# Patient Record
Sex: Female | Born: 1960 | Race: White | Hispanic: No | Smoking: Current every day smoker
Health system: Southern US, Community
[De-identification: ages and names within clinical notes are randomized; demographics above are authoritative.]

## PROBLEM LIST (undated history)

## (undated) DIAGNOSIS — M7710 Lateral epicondylitis, unspecified elbow: Secondary | ICD-10-CM

## (undated) DIAGNOSIS — Z9889 Other specified postprocedural states: Secondary | ICD-10-CM

## (undated) DIAGNOSIS — F329 Major depressive disorder, single episode, unspecified: Secondary | ICD-10-CM

## (undated) DIAGNOSIS — K3189 Other diseases of stomach and duodenum: Secondary | ICD-10-CM

## (undated) DIAGNOSIS — R1032 Left lower quadrant pain: Secondary | ICD-10-CM

## (undated) DIAGNOSIS — R030 Elevated blood-pressure reading, without diagnosis of hypertension: Secondary | ICD-10-CM

## (undated) DIAGNOSIS — M1990 Unspecified osteoarthritis, unspecified site: Secondary | ICD-10-CM

## (undated) DIAGNOSIS — H60399 Other infective otitis externa, unspecified ear: Secondary | ICD-10-CM

## (undated) DIAGNOSIS — D509 Iron deficiency anemia, unspecified: Secondary | ICD-10-CM

## (undated) DIAGNOSIS — F411 Generalized anxiety disorder: Secondary | ICD-10-CM

## (undated) DIAGNOSIS — M25529 Pain in unspecified elbow: Secondary | ICD-10-CM

## (undated) DIAGNOSIS — M549 Dorsalgia, unspecified: Secondary | ICD-10-CM

## (undated) DIAGNOSIS — I639 Cerebral infarction, unspecified: Secondary | ICD-10-CM

## (undated) DIAGNOSIS — K140 Glossitis: Secondary | ICD-10-CM

## (undated) DIAGNOSIS — R109 Unspecified abdominal pain: Secondary | ICD-10-CM

## (undated) DIAGNOSIS — R350 Frequency of micturition: Secondary | ICD-10-CM

## (undated) DIAGNOSIS — D518 Other vitamin B12 deficiency anemias: Secondary | ICD-10-CM

## (undated) DIAGNOSIS — R509 Fever, unspecified: Secondary | ICD-10-CM

## (undated) DIAGNOSIS — G4700 Insomnia, unspecified: Secondary | ICD-10-CM

## (undated) DIAGNOSIS — M545 Low back pain: Secondary | ICD-10-CM

## (undated) DIAGNOSIS — S0990XA Unspecified injury of head, initial encounter: Secondary | ICD-10-CM

## (undated) DIAGNOSIS — G43909 Migraine, unspecified, not intractable, without status migrainosus: Secondary | ICD-10-CM

## (undated) DIAGNOSIS — M5030 Other cervical disc degeneration, unspecified cervical region: Secondary | ICD-10-CM

## (undated) DIAGNOSIS — H9209 Otalgia, unspecified ear: Secondary | ICD-10-CM

## (undated) DIAGNOSIS — K219 Gastro-esophageal reflux disease without esophagitis: Secondary | ICD-10-CM

## (undated) DIAGNOSIS — R1031 Right lower quadrant pain: Secondary | ICD-10-CM

## (undated) DIAGNOSIS — R1013 Epigastric pain: Secondary | ICD-10-CM

## (undated) DIAGNOSIS — E538 Deficiency of other specified B group vitamins: Secondary | ICD-10-CM

## (undated) DIAGNOSIS — T424X1A Poisoning by benzodiazepines, accidental (unintentional), initial encounter: Secondary | ICD-10-CM

## (undated) DIAGNOSIS — I2510 Atherosclerotic heart disease of native coronary artery without angina pectoris: Secondary | ICD-10-CM

## (undated) DIAGNOSIS — R079 Chest pain, unspecified: Secondary | ICD-10-CM

## (undated) DIAGNOSIS — E785 Hyperlipidemia, unspecified: Secondary | ICD-10-CM

## (undated) DIAGNOSIS — M479 Spondylosis, unspecified: Secondary | ICD-10-CM

## (undated) HISTORY — DX: Other cervical disc degeneration, unspecified cervical region: M50.30

## (undated) HISTORY — DX: Chest pain, unspecified: R07.9

## (undated) HISTORY — PX: CEREBRAL ANEURYSM REPAIR: SHX164

## (undated) HISTORY — DX: Other diseases of stomach and duodenum: K31.89

## (undated) HISTORY — DX: Unspecified abdominal pain: R10.9

## (undated) HISTORY — DX: Low back pain: M54.5

## (undated) HISTORY — DX: Left lower quadrant pain: R10.32

## (undated) HISTORY — DX: Glossitis: K14.0

## (undated) HISTORY — DX: Insomnia, unspecified: G47.00

## (undated) HISTORY — DX: Deficiency of other specified B group vitamins: E53.8

## (undated) HISTORY — PX: ANKLE FRACTURE SURGERY: SHX122

## (undated) HISTORY — DX: Elevated blood-pressure reading, without diagnosis of hypertension: R03.0

## (undated) HISTORY — PX: BACK SURGERY: SHX140

## (undated) HISTORY — DX: Spondylosis, unspecified: M47.9

## (undated) HISTORY — DX: Hyperlipidemia, unspecified: E78.5

## (undated) HISTORY — DX: Major depressive disorder, single episode, unspecified: F32.9

## (undated) HISTORY — DX: Dorsalgia, unspecified: M54.9

## (undated) HISTORY — DX: Unspecified osteoarthritis, unspecified site: M19.90

## (undated) HISTORY — PX: TUBAL LIGATION: SHX77

## (undated) HISTORY — DX: Migraine, unspecified, not intractable, without status migrainosus: G43.909

## (undated) HISTORY — DX: Other vitamin B12 deficiency anemias: D51.8

## (undated) HISTORY — DX: Other infective otitis externa, unspecified ear: H60.399

## (undated) HISTORY — DX: Right lower quadrant pain: R10.31

## (undated) HISTORY — DX: Otalgia, unspecified ear: H92.09

## (undated) HISTORY — DX: Lateral epicondylitis, unspecified elbow: M77.10

## (undated) HISTORY — DX: Fever, unspecified: R50.9

## (undated) HISTORY — DX: Other specified postprocedural states: Z98.890

## (undated) HISTORY — DX: Poisoning by benzodiazepines, accidental (unintentional), initial encounter: T42.4X1A

## (undated) HISTORY — DX: Gastro-esophageal reflux disease without esophagitis: K21.9

## (undated) HISTORY — DX: Epigastric pain: R10.13

## (undated) HISTORY — DX: Pain in unspecified elbow: M25.529

## (undated) HISTORY — DX: Iron deficiency anemia, unspecified: D50.9

## (undated) HISTORY — DX: Frequency of micturition: R35.0

## (undated) HISTORY — DX: Unspecified injury of head, initial encounter: S09.90XA

## (undated) HISTORY — DX: Generalized anxiety disorder: F41.1

---

## 1997-08-26 ENCOUNTER — Emergency Department (HOSPITAL_COMMUNITY): Admission: EM | Admit: 1997-08-26 | Discharge: 1997-08-26 | Payer: Self-pay | Admitting: Emergency Medicine

## 1998-06-23 ENCOUNTER — Other Ambulatory Visit: Admission: RE | Admit: 1998-06-23 | Discharge: 1998-06-23 | Payer: Self-pay | Admitting: Internal Medicine

## 1998-08-02 ENCOUNTER — Ambulatory Visit (HOSPITAL_BASED_OUTPATIENT_CLINIC_OR_DEPARTMENT_OTHER): Admission: RE | Admit: 1998-08-02 | Discharge: 1998-08-02 | Payer: Self-pay | Admitting: Orthopaedic Surgery

## 1999-09-22 ENCOUNTER — Emergency Department (HOSPITAL_COMMUNITY): Admission: EM | Admit: 1999-09-22 | Discharge: 1999-09-22 | Payer: Self-pay | Admitting: Emergency Medicine

## 1999-09-23 ENCOUNTER — Encounter: Payer: Self-pay | Admitting: Emergency Medicine

## 1999-11-28 ENCOUNTER — Emergency Department (HOSPITAL_COMMUNITY): Admission: EM | Admit: 1999-11-28 | Discharge: 1999-11-28 | Payer: Self-pay | Admitting: Emergency Medicine

## 2000-07-14 ENCOUNTER — Encounter: Payer: Self-pay | Admitting: Emergency Medicine

## 2000-07-14 ENCOUNTER — Emergency Department (HOSPITAL_COMMUNITY): Admission: EM | Admit: 2000-07-14 | Discharge: 2000-07-14 | Payer: Self-pay | Admitting: Emergency Medicine

## 2001-04-27 HISTORY — PX: APPENDECTOMY: SHX54

## 2001-05-02 ENCOUNTER — Encounter (INDEPENDENT_AMBULATORY_CARE_PROVIDER_SITE_OTHER): Payer: Self-pay | Admitting: Specialist

## 2001-05-03 ENCOUNTER — Inpatient Hospital Stay (HOSPITAL_COMMUNITY): Admission: EM | Admit: 2001-05-03 | Discharge: 2001-05-04 | Payer: Self-pay

## 2001-05-03 ENCOUNTER — Encounter: Payer: Self-pay | Admitting: *Deleted

## 2001-05-03 ENCOUNTER — Encounter: Payer: Self-pay | Admitting: Emergency Medicine

## 2002-02-12 ENCOUNTER — Encounter: Payer: Self-pay | Admitting: Emergency Medicine

## 2002-02-12 ENCOUNTER — Inpatient Hospital Stay (HOSPITAL_COMMUNITY): Admission: EM | Admit: 2002-02-12 | Discharge: 2002-02-13 | Payer: Self-pay | Admitting: Emergency Medicine

## 2002-02-17 ENCOUNTER — Ambulatory Visit (HOSPITAL_BASED_OUTPATIENT_CLINIC_OR_DEPARTMENT_OTHER): Admission: RE | Admit: 2002-02-17 | Discharge: 2002-02-17 | Payer: Self-pay | Admitting: Urology

## 2002-02-17 HISTORY — PX: CYSTOSCOPY W/ URETEROSCOPY: SUR379

## 2002-11-19 ENCOUNTER — Ambulatory Visit (HOSPITAL_BASED_OUTPATIENT_CLINIC_OR_DEPARTMENT_OTHER): Admission: RE | Admit: 2002-11-19 | Discharge: 2002-11-19 | Payer: Self-pay | Admitting: Urology

## 2002-11-19 ENCOUNTER — Encounter: Payer: Self-pay | Admitting: Urology

## 2003-04-02 ENCOUNTER — Other Ambulatory Visit: Admission: RE | Admit: 2003-04-02 | Discharge: 2003-04-02 | Payer: Self-pay | Admitting: Obstetrics & Gynecology

## 2003-05-29 ENCOUNTER — Encounter: Payer: Self-pay | Admitting: Internal Medicine

## 2003-05-29 HISTORY — PX: ABDOMINAL HYSTERECTOMY: SHX81

## 2004-01-25 ENCOUNTER — Encounter (INDEPENDENT_AMBULATORY_CARE_PROVIDER_SITE_OTHER): Payer: Self-pay | Admitting: Specialist

## 2004-01-25 ENCOUNTER — Observation Stay (HOSPITAL_COMMUNITY): Admission: RE | Admit: 2004-01-25 | Discharge: 2004-01-26 | Payer: Self-pay | Admitting: Obstetrics & Gynecology

## 2004-04-07 ENCOUNTER — Ambulatory Visit: Payer: Self-pay | Admitting: Internal Medicine

## 2004-06-07 ENCOUNTER — Ambulatory Visit: Payer: Self-pay | Admitting: Internal Medicine

## 2004-06-07 ENCOUNTER — Ambulatory Visit (HOSPITAL_COMMUNITY): Admission: RE | Admit: 2004-06-07 | Discharge: 2004-06-07 | Payer: Self-pay | Admitting: Internal Medicine

## 2005-02-05 ENCOUNTER — Ambulatory Visit: Payer: Self-pay | Admitting: Internal Medicine

## 2005-04-30 ENCOUNTER — Ambulatory Visit: Payer: Self-pay | Admitting: Internal Medicine

## 2005-05-24 ENCOUNTER — Ambulatory Visit: Payer: Self-pay | Admitting: Internal Medicine

## 2005-06-15 ENCOUNTER — Ambulatory Visit: Payer: Self-pay | Admitting: Internal Medicine

## 2005-07-20 ENCOUNTER — Ambulatory Visit: Payer: Self-pay | Admitting: Internal Medicine

## 2005-09-05 ENCOUNTER — Ambulatory Visit: Payer: Self-pay | Admitting: Internal Medicine

## 2006-01-24 ENCOUNTER — Ambulatory Visit: Payer: Self-pay | Admitting: Internal Medicine

## 2006-02-19 ENCOUNTER — Ambulatory Visit: Payer: Self-pay | Admitting: Internal Medicine

## 2006-05-07 ENCOUNTER — Ambulatory Visit: Payer: Self-pay | Admitting: Internal Medicine

## 2006-05-28 HISTORY — PX: ANTERIOR CERVICAL DECOMP/DISCECTOMY FUSION: SHX1161

## 2006-08-06 ENCOUNTER — Ambulatory Visit: Payer: Self-pay | Admitting: Internal Medicine

## 2006-08-06 LAB — CONVERTED CEMR LAB
ALT: 23 units/L (ref 0–40)
Albumin: 3.4 g/dL — ABNORMAL LOW (ref 3.5–5.2)
BUN: 5 mg/dL — ABNORMAL LOW (ref 6–23)
Basophils Absolute: 0.1 10*3/uL (ref 0.0–0.1)
Basophils Relative: 0.6 % (ref 0.0–1.0)
Bilirubin Urine: NEGATIVE
CO2: 33 meq/L — ABNORMAL HIGH (ref 19–32)
Eosinophils Absolute: 0.1 10*3/uL (ref 0.0–0.6)
Eosinophils Relative: 1 % (ref 0.0–5.0)
Glucose, Bld: 83 mg/dL (ref 70–99)
HCT: 36.2 % (ref 36.0–46.0)
HDL: 56.7 mg/dL (ref >39.0)
Hemoglobin, Urine: NEGATIVE
Leukocytes, UA: NEGATIVE
Monocytes Absolute: 0.7 10*3/uL (ref 0.2–0.7)
Monocytes Relative: 7.7 % (ref 3.0–11.0)
Neutro Abs: 5 10*3/uL (ref 1.4–7.7)
Neutrophils Relative %: 55.2 % (ref 43.0–77.0)
RBC: 3.72 M/uL — ABNORMAL LOW (ref 3.87–5.11)
RDW: 13.6 % (ref 11.5–14.6)
TSH: 1.21 microintl units/mL (ref 0.35–5.50)
Total Bilirubin: 0.5 mg/dL (ref 0.3–1.2)
Total CHOL/HDL Ratio: 3.6
Urine Glucose: NEGATIVE mg/dL

## 2006-08-17 ENCOUNTER — Ambulatory Visit (HOSPITAL_COMMUNITY): Admission: RE | Admit: 2006-08-17 | Discharge: 2006-08-17 | Payer: Self-pay | Admitting: Internal Medicine

## 2006-08-20 ENCOUNTER — Ambulatory Visit: Payer: Self-pay | Admitting: Internal Medicine

## 2006-10-22 ENCOUNTER — Ambulatory Visit (HOSPITAL_COMMUNITY): Admission: RE | Admit: 2006-10-22 | Discharge: 2006-10-23 | Payer: Self-pay | Admitting: Neurosurgery

## 2006-11-01 ENCOUNTER — Ambulatory Visit: Payer: Self-pay | Admitting: Internal Medicine

## 2006-11-01 LAB — CONVERTED CEMR LAB
AST: 17 units/L (ref 0–37)
Albumin: 2.9 g/dL — ABNORMAL LOW (ref 3.5–5.2)
Alkaline Phosphatase: 76 units/L (ref 39–117)
BUN: 1 mg/dL — ABNORMAL LOW (ref 6–23)
Basophils Relative: 0.9 % (ref 0.0–1.0)
CO2: 30 meq/L (ref 19–32)
Creatinine, Ser: 0.4 mg/dL (ref 0.4–1.2)
GFR calc non Af Amer: 182 mL/min
Glucose, Bld: 81 mg/dL (ref 70–99)
HCT: 32.7 % — ABNORMAL LOW (ref 36.0–46.0)
Lymphocytes Relative: 35.1 % (ref 12.0–46.0)
MCV: 94.2 fL (ref 78.0–100.0)
Monocytes Absolute: 0.5 10*3/uL (ref 0.2–0.7)
Monocytes Relative: 7.2 % (ref 3.0–11.0)
Neutrophils Relative %: 55.1 % (ref 43.0–77.0)
Platelets: 423 10*3/uL — ABNORMAL HIGH (ref 150–400)
Potassium: 3.8 meq/L (ref 3.5–5.1)
RBC: 3.47 M/uL — ABNORMAL LOW (ref 3.87–5.11)
RDW: 12.2 % (ref 11.5–14.6)
Total Bilirubin: 0.3 mg/dL (ref 0.3–1.2)

## 2006-11-25 ENCOUNTER — Emergency Department (HOSPITAL_COMMUNITY): Admission: EM | Admit: 2006-11-25 | Discharge: 2006-11-25 | Payer: Self-pay | Admitting: Emergency Medicine

## 2006-11-25 ENCOUNTER — Ambulatory Visit: Payer: Self-pay | Admitting: *Deleted

## 2006-11-25 ENCOUNTER — Inpatient Hospital Stay (HOSPITAL_COMMUNITY): Admission: RE | Admit: 2006-11-25 | Discharge: 2006-11-29 | Payer: Self-pay | Admitting: *Deleted

## 2006-12-12 ENCOUNTER — Encounter: Payer: Self-pay | Admitting: Internal Medicine

## 2007-02-04 ENCOUNTER — Ambulatory Visit: Payer: Self-pay | Admitting: Internal Medicine

## 2007-02-06 ENCOUNTER — Encounter: Payer: Self-pay | Admitting: Internal Medicine

## 2007-02-06 DIAGNOSIS — F329 Major depressive disorder, single episode, unspecified: Secondary | ICD-10-CM

## 2007-02-06 DIAGNOSIS — F411 Generalized anxiety disorder: Secondary | ICD-10-CM

## 2007-02-06 DIAGNOSIS — F3289 Other specified depressive episodes: Secondary | ICD-10-CM

## 2007-02-06 DIAGNOSIS — G43909 Migraine, unspecified, not intractable, without status migrainosus: Secondary | ICD-10-CM

## 2007-02-06 DIAGNOSIS — G4700 Insomnia, unspecified: Secondary | ICD-10-CM | POA: Insufficient documentation

## 2007-02-06 DIAGNOSIS — F32A Depression, unspecified: Secondary | ICD-10-CM | POA: Insufficient documentation

## 2007-02-06 DIAGNOSIS — M1990 Unspecified osteoarthritis, unspecified site: Secondary | ICD-10-CM | POA: Insufficient documentation

## 2007-02-06 DIAGNOSIS — M545 Low back pain, unspecified: Secondary | ICD-10-CM

## 2007-02-06 DIAGNOSIS — E785 Hyperlipidemia, unspecified: Secondary | ICD-10-CM

## 2007-02-06 HISTORY — DX: Hyperlipidemia, unspecified: E78.5

## 2007-02-06 HISTORY — DX: Generalized anxiety disorder: F41.1

## 2007-02-06 HISTORY — DX: Insomnia, unspecified: G47.00

## 2007-02-06 HISTORY — DX: Major depressive disorder, single episode, unspecified: F32.9

## 2007-02-06 HISTORY — DX: Migraine, unspecified, not intractable, without status migrainosus: G43.909

## 2007-02-06 HISTORY — DX: Low back pain, unspecified: M54.50

## 2007-02-06 HISTORY — DX: Other specified depressive episodes: F32.89

## 2007-03-12 ENCOUNTER — Encounter: Payer: Self-pay | Admitting: Internal Medicine

## 2007-03-12 ENCOUNTER — Ambulatory Visit: Payer: Self-pay | Admitting: Internal Medicine

## 2007-03-12 DIAGNOSIS — M479 Spondylosis, unspecified: Secondary | ICD-10-CM | POA: Insufficient documentation

## 2007-03-12 DIAGNOSIS — R350 Frequency of micturition: Secondary | ICD-10-CM

## 2007-07-01 ENCOUNTER — Ambulatory Visit: Payer: Self-pay | Admitting: Internal Medicine

## 2007-07-01 DIAGNOSIS — R030 Elevated blood-pressure reading, without diagnosis of hypertension: Secondary | ICD-10-CM | POA: Insufficient documentation

## 2007-07-01 DIAGNOSIS — M549 Dorsalgia, unspecified: Secondary | ICD-10-CM

## 2007-07-01 HISTORY — DX: Elevated blood-pressure reading, without diagnosis of hypertension: R03.0

## 2007-07-01 HISTORY — DX: Dorsalgia, unspecified: M54.9

## 2007-07-30 ENCOUNTER — Encounter: Payer: Self-pay | Admitting: Internal Medicine

## 2007-07-31 ENCOUNTER — Ambulatory Visit: Payer: Self-pay | Admitting: Internal Medicine

## 2007-10-23 ENCOUNTER — Ambulatory Visit: Payer: Self-pay | Admitting: Internal Medicine

## 2007-10-23 DIAGNOSIS — K140 Glossitis: Secondary | ICD-10-CM | POA: Insufficient documentation

## 2007-10-23 DIAGNOSIS — K3189 Other diseases of stomach and duodenum: Secondary | ICD-10-CM

## 2007-10-23 DIAGNOSIS — R1013 Epigastric pain: Secondary | ICD-10-CM

## 2007-11-10 ENCOUNTER — Telehealth: Payer: Self-pay | Admitting: Internal Medicine

## 2007-12-09 ENCOUNTER — Telehealth: Payer: Self-pay | Admitting: Internal Medicine

## 2007-12-29 ENCOUNTER — Telehealth: Payer: Self-pay | Admitting: Internal Medicine

## 2007-12-29 ENCOUNTER — Emergency Department (HOSPITAL_COMMUNITY): Admission: EM | Admit: 2007-12-29 | Discharge: 2007-12-30 | Payer: Self-pay | Admitting: Emergency Medicine

## 2007-12-30 ENCOUNTER — Telehealth (INDEPENDENT_AMBULATORY_CARE_PROVIDER_SITE_OTHER): Payer: Self-pay | Admitting: *Deleted

## 2008-01-09 ENCOUNTER — Telehealth (INDEPENDENT_AMBULATORY_CARE_PROVIDER_SITE_OTHER): Payer: Self-pay | Admitting: *Deleted

## 2008-02-09 ENCOUNTER — Telehealth: Payer: Self-pay | Admitting: Internal Medicine

## 2008-03-09 ENCOUNTER — Telehealth (INDEPENDENT_AMBULATORY_CARE_PROVIDER_SITE_OTHER): Payer: Self-pay | Admitting: *Deleted

## 2008-03-30 ENCOUNTER — Ambulatory Visit: Payer: Self-pay | Admitting: Internal Medicine

## 2008-04-12 ENCOUNTER — Telehealth (INDEPENDENT_AMBULATORY_CARE_PROVIDER_SITE_OTHER): Payer: Self-pay | Admitting: *Deleted

## 2008-05-10 ENCOUNTER — Telehealth (INDEPENDENT_AMBULATORY_CARE_PROVIDER_SITE_OTHER): Payer: Self-pay | Admitting: *Deleted

## 2008-06-03 ENCOUNTER — Ambulatory Visit: Payer: Self-pay | Admitting: Internal Medicine

## 2008-06-03 DIAGNOSIS — R509 Fever, unspecified: Secondary | ICD-10-CM | POA: Insufficient documentation

## 2008-06-03 DIAGNOSIS — M7710 Lateral epicondylitis, unspecified elbow: Secondary | ICD-10-CM | POA: Insufficient documentation

## 2008-06-08 ENCOUNTER — Encounter: Payer: Self-pay | Admitting: Internal Medicine

## 2008-06-11 ENCOUNTER — Telehealth (INDEPENDENT_AMBULATORY_CARE_PROVIDER_SITE_OTHER): Payer: Self-pay | Admitting: *Deleted

## 2008-06-24 ENCOUNTER — Telehealth: Payer: Self-pay | Admitting: Internal Medicine

## 2008-07-03 ENCOUNTER — Emergency Department (HOSPITAL_COMMUNITY): Admission: EM | Admit: 2008-07-03 | Discharge: 2008-07-03 | Payer: Self-pay | Admitting: Emergency Medicine

## 2008-07-12 ENCOUNTER — Telehealth (INDEPENDENT_AMBULATORY_CARE_PROVIDER_SITE_OTHER): Payer: Self-pay | Admitting: *Deleted

## 2008-08-06 ENCOUNTER — Telehealth: Payer: Self-pay | Admitting: Internal Medicine

## 2008-09-07 ENCOUNTER — Telehealth (INDEPENDENT_AMBULATORY_CARE_PROVIDER_SITE_OTHER): Payer: Self-pay | Admitting: *Deleted

## 2008-09-10 ENCOUNTER — Telehealth (INDEPENDENT_AMBULATORY_CARE_PROVIDER_SITE_OTHER): Payer: Self-pay | Admitting: *Deleted

## 2008-10-08 ENCOUNTER — Telehealth (INDEPENDENT_AMBULATORY_CARE_PROVIDER_SITE_OTHER): Payer: Self-pay | Admitting: *Deleted

## 2008-11-02 ENCOUNTER — Ambulatory Visit: Payer: Self-pay | Admitting: Internal Medicine

## 2008-11-08 ENCOUNTER — Telehealth: Payer: Self-pay | Admitting: Internal Medicine

## 2008-11-09 ENCOUNTER — Encounter: Admission: RE | Admit: 2008-11-09 | Discharge: 2008-11-09 | Payer: Self-pay | Admitting: Internal Medicine

## 2008-12-08 ENCOUNTER — Telehealth: Payer: Self-pay | Admitting: Internal Medicine

## 2009-01-07 ENCOUNTER — Telehealth: Payer: Self-pay | Admitting: Internal Medicine

## 2009-01-18 ENCOUNTER — Telehealth: Payer: Self-pay | Admitting: Internal Medicine

## 2009-02-09 ENCOUNTER — Telehealth: Payer: Self-pay | Admitting: Internal Medicine

## 2009-02-24 ENCOUNTER — Telehealth: Payer: Self-pay | Admitting: Internal Medicine

## 2009-03-11 ENCOUNTER — Telehealth: Payer: Self-pay | Admitting: Internal Medicine

## 2009-04-11 ENCOUNTER — Telehealth: Payer: Self-pay | Admitting: Internal Medicine

## 2009-05-03 ENCOUNTER — Ambulatory Visit: Payer: Self-pay | Admitting: Internal Medicine

## 2009-05-03 DIAGNOSIS — M5030 Other cervical disc degeneration, unspecified cervical region: Secondary | ICD-10-CM | POA: Insufficient documentation

## 2009-05-03 HISTORY — DX: Other cervical disc degeneration, unspecified cervical region: M50.30

## 2009-05-31 ENCOUNTER — Ambulatory Visit: Payer: Self-pay | Admitting: Internal Medicine

## 2009-06-14 ENCOUNTER — Ambulatory Visit: Payer: Self-pay | Admitting: Internal Medicine

## 2009-06-15 LAB — CONVERTED CEMR LAB
ALT: 15 units/L (ref 0–35)
AST: 24 units/L (ref 0–37)
Albumin: 3.4 g/dL — ABNORMAL LOW (ref 3.5–5.2)
BUN: 12 mg/dL (ref 6–23)
Basophils Relative: 1.1 % (ref 0.0–3.0)
CO2: 28 meq/L (ref 19–32)
Calcium: 8.8 mg/dL (ref 8.4–10.5)
Chloride: 109 meq/L (ref 96–112)
Eosinophils Relative: 1 % (ref 0.0–5.0)
Folate: 12.7 ng/mL
GFR calc non Af Amer: 112.59 mL/min (ref >60)
HCT: 29.6 % — ABNORMAL LOW (ref 36.0–46.0)
Ketones, ur: NEGATIVE mg/dL
LDL Cholesterol: 99 mg/dL (ref 0–99)
Lymphocytes Relative: 34.7 % (ref 12.0–46.0)
Monocytes Absolute: 0.6 10*3/uL (ref 0.1–1.0)
Monocytes Relative: 6.7 % (ref 3.0–12.0)
Neutro Abs: 4.6 10*3/uL (ref 1.4–7.7)
Neutrophils Relative %: 56.5 % (ref 43.0–77.0)
Potassium: 4.2 meq/L (ref 3.5–5.1)
RDW: 15.3 % — ABNORMAL HIGH (ref 11.5–14.6)
Saturation Ratios: 2.3 % — ABNORMAL LOW (ref 20.0–50.0)
Sodium: 143 meq/L (ref 135–145)
Specific Gravity, Urine: >=1.03 (ref 1.000–1.030)
TSH: 3.6 microintl units/mL (ref 0.35–5.50)
Transferrin: 306.6 mg/dL (ref 212.0–360.0)
Urine Glucose: NEGATIVE mg/dL
VLDL: 30.6 mg/dL (ref 0.0–40.0)
Vitamin B-12: 209 pg/mL — ABNORMAL LOW (ref 211–911)
pH: 5 (ref 5.0–8.0)

## 2009-06-20 ENCOUNTER — Encounter: Payer: Self-pay | Admitting: Internal Medicine

## 2009-06-21 ENCOUNTER — Encounter (INDEPENDENT_AMBULATORY_CARE_PROVIDER_SITE_OTHER): Payer: Self-pay | Admitting: *Deleted

## 2009-06-21 ENCOUNTER — Ambulatory Visit: Payer: Self-pay | Admitting: Internal Medicine

## 2009-06-21 DIAGNOSIS — D509 Iron deficiency anemia, unspecified: Secondary | ICD-10-CM

## 2009-06-21 DIAGNOSIS — R079 Chest pain, unspecified: Secondary | ICD-10-CM

## 2009-06-21 HISTORY — DX: Iron deficiency anemia, unspecified: D50.9

## 2009-06-22 ENCOUNTER — Telehealth: Payer: Self-pay | Admitting: Internal Medicine

## 2009-07-11 ENCOUNTER — Ambulatory Visit: Payer: Self-pay | Admitting: Internal Medicine

## 2009-07-11 DIAGNOSIS — D518 Other vitamin B12 deficiency anemias: Secondary | ICD-10-CM

## 2009-07-11 DIAGNOSIS — K219 Gastro-esophageal reflux disease without esophagitis: Secondary | ICD-10-CM

## 2009-07-11 HISTORY — DX: Gastro-esophageal reflux disease without esophagitis: K21.9

## 2009-07-11 HISTORY — DX: Other vitamin B12 deficiency anemias: D51.8

## 2009-07-19 ENCOUNTER — Telehealth: Payer: Self-pay | Admitting: Endocrinology

## 2009-07-26 ENCOUNTER — Ambulatory Visit: Payer: Self-pay | Admitting: Internal Medicine

## 2009-07-26 DIAGNOSIS — R1032 Left lower quadrant pain: Secondary | ICD-10-CM

## 2009-07-26 DIAGNOSIS — R1031 Right lower quadrant pain: Secondary | ICD-10-CM | POA: Insufficient documentation

## 2009-07-26 DIAGNOSIS — E538 Deficiency of other specified B group vitamins: Secondary | ICD-10-CM

## 2009-07-26 LAB — CONVERTED CEMR LAB
AST: 25 units/L (ref 0–37)
Albumin: 3.9 g/dL (ref 3.5–5.2)
Alkaline Phosphatase: 79 units/L (ref 39–117)
Bilirubin Urine: NEGATIVE
Bilirubin, Direct: 0.1 mg/dL (ref 0.0–0.3)
Creatinine, Ser: 0.4 mg/dL (ref 0.4–1.2)
Eosinophils Relative: 0.7 % (ref 0.0–5.0)
GFR calc non Af Amer: 179.68 mL/min (ref >60)
Glucose, Bld: 78 mg/dL (ref 70–99)
Hemoglobin, Urine: NEGATIVE
Ketones, ur: NEGATIVE mg/dL
Lipase: 23 units/L (ref 11.0–59.0)
Lymphocytes Relative: 29.1 % (ref 12.0–46.0)
Lymphs Abs: 2.7 10*3/uL (ref 0.7–4.0)
MCHC: 32.5 g/dL (ref 30.0–36.0)
MCV: 89.3 fL (ref 78.0–100.0)
Monocytes Absolute: 0.6 10*3/uL (ref 0.1–1.0)
Sodium: 146 meq/L — ABNORMAL HIGH (ref 135–145)
Total Protein, Urine: NEGATIVE mg/dL
Total Protein: 7.3 g/dL (ref 6.0–8.3)
Urine Glucose: NEGATIVE mg/dL
WBC: 9.2 10*3/uL (ref 4.5–10.5)

## 2009-08-01 ENCOUNTER — Telehealth: Payer: Self-pay | Admitting: Internal Medicine

## 2009-08-04 ENCOUNTER — Ambulatory Visit: Payer: Self-pay | Admitting: Internal Medicine

## 2009-08-11 ENCOUNTER — Encounter: Payer: Self-pay | Admitting: Internal Medicine

## 2009-08-23 ENCOUNTER — Encounter: Admission: RE | Admit: 2009-08-23 | Discharge: 2009-08-23 | Payer: Self-pay | Admitting: Internal Medicine

## 2009-08-30 ENCOUNTER — Ambulatory Visit: Payer: Self-pay | Admitting: Internal Medicine

## 2009-08-31 ENCOUNTER — Encounter: Admission: RE | Admit: 2009-08-31 | Discharge: 2009-08-31 | Payer: Self-pay | Admitting: Internal Medicine

## 2009-08-31 LAB — HM MAMMOGRAPHY

## 2009-09-22 ENCOUNTER — Encounter: Payer: Self-pay | Admitting: Internal Medicine

## 2009-10-04 ENCOUNTER — Ambulatory Visit: Payer: Self-pay | Admitting: Internal Medicine

## 2009-10-04 DIAGNOSIS — H60399 Other infective otitis externa, unspecified ear: Secondary | ICD-10-CM | POA: Insufficient documentation

## 2009-11-03 ENCOUNTER — Telehealth: Payer: Self-pay | Admitting: Internal Medicine

## 2009-11-29 ENCOUNTER — Encounter (INDEPENDENT_AMBULATORY_CARE_PROVIDER_SITE_OTHER): Payer: Self-pay | Admitting: *Deleted

## 2009-11-29 ENCOUNTER — Ambulatory Visit: Payer: Self-pay | Admitting: Internal Medicine

## 2009-11-29 DIAGNOSIS — S0990XA Unspecified injury of head, initial encounter: Secondary | ICD-10-CM | POA: Insufficient documentation

## 2009-11-29 DIAGNOSIS — H9209 Otalgia, unspecified ear: Secondary | ICD-10-CM | POA: Insufficient documentation

## 2009-11-29 DIAGNOSIS — R109 Unspecified abdominal pain: Secondary | ICD-10-CM

## 2009-11-29 HISTORY — DX: Unspecified injury of head, initial encounter: S09.90XA

## 2009-11-29 LAB — CONVERTED CEMR LAB
AST: 25 units/L (ref 0–37)
BUN: 20 mg/dL (ref 6–23)
Basophils Absolute: 0 10*3/uL (ref 0.0–0.1)
CO2: 29 meq/L (ref 19–32)
Chloride: 104 meq/L (ref 96–112)
Creatinine, Ser: 0.6 mg/dL (ref 0.4–1.2)
Eosinophils Relative: 0.7 % (ref 0.0–5.0)
GFR calc non Af Amer: 114.58 mL/min (ref >60)
Hemoglobin, Urine: NEGATIVE
Lymphocytes Relative: 20.1 % (ref 12.0–46.0)
Lymphs Abs: 3.7 10*3/uL (ref 0.7–4.0)
MCV: 98.1 fL (ref 78.0–100.0)
Monocytes Absolute: 1.3 10*3/uL — ABNORMAL HIGH (ref 0.1–1.0)
Neutro Abs: 13.2 10*3/uL — ABNORMAL HIGH (ref 1.4–7.7)
Neutrophils Relative %: 71.8 % (ref 43.0–77.0)
Nitrite: NEGATIVE
Platelets: 448 10*3/uL — ABNORMAL HIGH (ref 150.0–400.0)
Potassium: 3.8 meq/L (ref 3.5–5.1)
RDW: 13.6 % (ref 11.5–14.6)
Urobilinogen, UA: 0.2 (ref 0.0–1.0)
pH: 5.5 (ref 5.0–8.0)

## 2009-11-30 ENCOUNTER — Ambulatory Visit: Payer: Self-pay | Admitting: Internal Medicine

## 2009-12-19 ENCOUNTER — Telehealth: Payer: Self-pay | Admitting: Internal Medicine

## 2009-12-29 ENCOUNTER — Telehealth: Payer: Self-pay | Admitting: Internal Medicine

## 2010-01-27 ENCOUNTER — Telehealth: Payer: Self-pay | Admitting: Internal Medicine

## 2010-02-27 ENCOUNTER — Telehealth: Payer: Self-pay | Admitting: Internal Medicine

## 2010-03-27 ENCOUNTER — Telehealth: Payer: Self-pay | Admitting: Internal Medicine

## 2010-05-01 ENCOUNTER — Telehealth: Payer: Self-pay | Admitting: Internal Medicine

## 2010-05-31 ENCOUNTER — Telehealth: Payer: Self-pay | Admitting: Internal Medicine

## 2010-06-18 ENCOUNTER — Encounter: Payer: Self-pay | Admitting: Internal Medicine

## 2010-06-20 ENCOUNTER — Ambulatory Visit: Admit: 2010-06-20 | Payer: Self-pay | Admitting: Internal Medicine

## 2010-06-27 ENCOUNTER — Ambulatory Visit
Admission: RE | Admit: 2010-06-27 | Discharge: 2010-06-27 | Payer: Self-pay | Source: Home / Self Care | Attending: Internal Medicine | Admitting: Internal Medicine

## 2010-06-27 ENCOUNTER — Other Ambulatory Visit: Payer: Self-pay | Admitting: Internal Medicine

## 2010-06-27 ENCOUNTER — Encounter: Payer: Self-pay | Admitting: Internal Medicine

## 2010-06-27 DIAGNOSIS — M25529 Pain in unspecified elbow: Secondary | ICD-10-CM | POA: Insufficient documentation

## 2010-06-27 LAB — CBC WITH DIFFERENTIAL/PLATELET
Eosinophils Absolute: 0.1 10*3/uL (ref 0.0–0.7)
Eosinophils Relative: 0.7 % (ref 0.0–5.0)
HCT: 38.6 % (ref 36.0–46.0)
Hemoglobin: 13.3 g/dL (ref 12.0–15.0)
MCHC: 34.3 g/dL (ref 30.0–36.0)
MCV: 100.2 fl — ABNORMAL HIGH (ref 78.0–100.0)
Monocytes Absolute: 0.5 10*3/uL (ref 0.1–1.0)
Monocytes Relative: 5.7 % (ref 3.0–12.0)
Platelets: 415 10*3/uL — ABNORMAL HIGH (ref 150.0–400.0)
WBC: 8.9 10*3/uL (ref 4.5–10.5)

## 2010-06-27 LAB — BASIC METABOLIC PANEL
BUN: 15 mg/dL (ref 6–23)
GFR: 184.32 mL/min (ref >60.00)
Glucose, Bld: 70 mg/dL (ref 70–99)

## 2010-06-27 LAB — HEPATIC FUNCTION PANEL
ALT: 15 U/L (ref 0–35)
Albumin: 3.9 g/dL (ref 3.5–5.2)
Alkaline Phosphatase: 85 U/L (ref 39–117)
Total Protein: 7 g/dL (ref 6.0–8.3)

## 2010-06-27 LAB — B12 AND FOLATE PANEL: Folate: 8.1 ng/mL (ref >5.9)

## 2010-06-27 LAB — TSH: TSH: 1.25 u[IU]/mL (ref 0.35–5.50)

## 2010-06-27 LAB — LIPID PANEL
Cholesterol: 273 mg/dL — ABNORMAL HIGH (ref 0–200)
Triglycerides: 95 mg/dL (ref 0.0–149.0)
VLDL: 19 mg/dL (ref 0.0–40.0)

## 2010-06-27 LAB — URINALYSIS
Ketones, ur: NEGATIVE
Leukocytes, UA: NEGATIVE
Urine Glucose: NEGATIVE

## 2010-06-27 LAB — LDL CHOLESTEROL, DIRECT: Direct LDL: 189.4 mg/dL

## 2010-06-27 NOTE — Progress Notes (Signed)
Summary: rx refill request  Phone Note Call from Patient Call back at Home Phone [phone]   Caller: Patient Summary of Call: patient called requesting authorization for early refill of pain med. pharmacy would not fill prescription because it was too early. Initial call taken by: Sydell Axon,  August 01, 2009 8:26 AM     Appended Document: rx refill request patient called back and said to disregard the previous message. she contacted the pharmacy, and they will refill the prescription.

## 2010-06-27 NOTE — Progress Notes (Signed)
Summary: Percocet  Phone Note Call from Patient   Caller: Patient [phone] Summary of Call: Pt called requesting refill of Percocet Initial call taken by:  Pyle, CMA,  May 01, 2010 9:10 AM  Follow-up for Phone Call        Pt informed via VM, Rx in cabinet for pt pick up Follow-up by:  Pyle, CMA,  May 01, 2010 10:52 AM    New/Updated Medications: PERCOCET 10-325 MG  TABS (OXYCODONE-ACETAMINOPHEN) 1/2 - 1 by mouth qid as needed pain - to fill May 01, 2010 Prescriptions: PERCOCET 10-325 MG  TABS (OXYCODONE-ACETAMINOPHEN) 1/2 - 1 by mouth qid as needed pain - to fill May 01, 2010  #120 x 0   Entered and Authorized by:   Corwin Levins MD   Signed by:   Corwin Levins MD on 05/01/2010   Method used:   Print then Give to Patient   RxID:   [id number]  done hardcopy to LIM side B - dahlia Corwin Levins MD  May 01, 2010 10:41 AM

## 2010-06-27 NOTE — Procedures (Signed)
Summary: Upper Endoscopy  Patient: Michelle [name] Note: All result statuses are Final unless otherwise noted.  Tests: (1) Upper Endoscopy (EGD)   EGD Upper Endoscopy       DONE     [facility]     [address]     [place], [place]  [address]           ENDOSCOPY PROCEDURE REPORT           PATIENT:  Michelle [name], Michelle [name]  MR#:  [id number]     BIRTHDATE:  02-08-1961, 48 yrs. old  GENDER:  female           ENDOSCOPIST:  Wilhemina Bonito. Eda Keys, MD     Referred by:  Oliver Barre, M.D.           PROCEDURE DATE:  08/04/2009     PROCEDURE:  EGD with biopsy     ASA CLASS:  Class II     INDICATIONS:  anemia ( low iron sat and B12) ; GERD           MEDICATIONS:   There was residual sedation effect present from     prior procedure., Fentanyl 25 mcg IV, Versed 2 mg IV     TOPICAL ANESTHETIC:  Exactacain Spray           DESCRIPTION OF PROCEDURE:   After the risks benefits and     alternatives of the procedure were thoroughly explained, informed     consent was obtained.  The [facility] [id number] endoscope was     introduced through the mouth and advanced to the third portion of     the duodenum, without limitations.  The instrument was slowly     withdrawn as the mucosa was fully examined.     <<PROCEDUREIMAGES>>           The upper, middle, and distal third of the esophagus were     carefully inspected and no abnormalities were noted. The z-line     was well seen at the GEJ. The endoscope was pushed into the fundus     which was normal including a retroflexed view. The antrum,gastric     body, first and second part of the duodenum were unremarkable     except for atrophic gastric mucosa.    Retroflexed views revealed     no abnormalities.  Biopsies of postbulbar duodenum taken to r/o     sprue.  The scope was then withdrawn from the patient and the     procedure completed.           COMPLICATIONS:  None           ENDOSCOPIC IMPRESSION:     1) Atrophic gastric mucosa     2) Otherwise  Normal EGD     3) Gerd           RECOMMENDATIONS:     1) continue iron and B12 replacement     2) Follow up with Dr. Jonny Ruiz in a few weeks to have your blood     counts checked; if still low, may need to see a hematologist (will     leave up to Dr Jonny Ruiz)           ______________________________     Wilhemina Bonito. Eda Keys, MD           CC:  Corwin Levins, MD, The Patient           n.  eSIGNED:   Wilhemina Bonito. Eda Keys at 08/04/2009 02:28 PM           Dara Lords, [id number]  Note: An exclamation mark (!) indicates a result that was not dispersed into the flowsheet. Document Creation Date: 08/04/2009 2:29 PM _______________________________________________________________________  (1) Order result status: Final Collection or observation date-time: 08/04/2009 14:20 Requested date-time:  Receipt date-time:  Reported date-time:  Referring Physician:   Ordering Physician: Fransico Setters [phone]) Specimen Source:  Source: Launa Grill Order Number: [id number] Lab site:
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
## 2010-06-27 NOTE — Letter (Signed)
Summary: [facility] Instructions  [facility]  [address] [place], [place] [address]   Phone: [phone]  Fax: [phone]       Michelle [name]    [birth year]    MRN: [id number]        Procedure Day /Date:THURSDAY, 08/04/09     Arrival Time:12:30 PM     Procedure Time:1:30 PM     Location of Procedure:                    X  [facility] (4th Floor)   PREPARATION FOR COLONOSCOPY WITH MOVIPREP/ENDO   Starting 5 days prior to your procedure 07/30/09 do not eat nuts, seeds, popcorn, corn, beans, peas,  salads, or any raw vegetables.  Do not take any fiber supplements (e.g. Metamucil, Citrucel, and Benefiber).  THE DAY BEFORE YOUR PROCEDURE         DATE: 08/03/09  ONG:[id number]  1.  Drink clear liquids the entire day-NO SOLID FOOD  2.  Do not drink anything colored red or purple.  Avoid juices with pulp.  No orange juice.  3.  Drink at least 64 oz. (8 glasses) of fluid/clear liquids during the day to prevent dehydration and help the prep work efficiently.  CLEAR LIQUIDS INCLUDE: Water Jello Ice Popsicles Tea (sugar ok, no milk/cream) Powdered fruit flavored drinks Coffee (sugar ok, no milk/cream) Gatorade Juice: apple, white grape, white cranberry  Lemonade Clear bullion, consomm, broth Carbonated beverages (any kind) Strained chicken noodle soup Hard Candy                             4.  In the morning, mix first dose of MoviPrep solution:    Empty 1 Pouch A and 1 Pouch B into the disposable container    Add lukewarm drinking water to the top line of the container. Mix to dissolve    Refrigerate (mixed solution should be used within 24 hrs)  5.  Begin drinking the prep at 5:00 p.m. The MoviPrep container is divided by 4 marks.   Every 15 minutes drink the solution down to the next mark (approximately 8 oz) until the full liter is complete.   6.  Follow completed prep with 16 oz of clear liquid of your choice (Nothing red or purple).   Continue to drink clear liquids until bedtime.  7.  Before going to bed, mix second dose of MoviPrep solution:    Empty 1 Pouch A and 1 Pouch B into the disposable container    Add lukewarm drinking water to the top line of the container. Mix to dissolve    Refrigerate  THE DAY OF YOUR PROCEDURE      DATE: 08/04/09 DAY: THURSDAY  Beginning at 8:30 a.m. (5 hours before procedure):         1. Every 15 minutes, drink the solution down to the next mark (approx 8 oz) until the full liter is complete.  2. Follow completed prep with 16 oz. of clear liquid of your choice.    3. You may drink clear liquids until 11:30 AM (2 HOURS BEFORE PROCEDURE).   MEDICATION INSTRUCTIONS  Unless otherwise instructed, you should take regular prescription medications with a small sip of water   as early as possible the morning of your procedure.           OTHER INSTRUCTIONS  You will need a responsible adult at least 51 years of  age to accompany you and drive you home.   This person must remain in the waiting room during your procedure.  Wear loose fitting clothing that is easily removed.  Leave jewelry and other valuables at home.  However, you may wish to bring a book to read or  an iPod/MP3 player to listen to music as you wait for your procedure to start.  Remove all body piercing jewelry and leave at home.  Total time from sign-in until discharge is approximately 2-3 hours.  You should go home directly after your procedure and rest.  You can resume normal activities the  day after your procedure.  The day of your procedure you should not:   Drive   Make legal decisions   Operate machinery   Drink alcohol   Return to work  You will receive specific instructions about eating, activities and medications before you leave.    The above instructions have been reviewed and explained to me by   _______________________    I fully understand and can verbalize these instructions  _____________________________ Date _________

## 2010-06-27 NOTE — Assessment & Plan Note (Signed)
Summary: Michelle [name] DEF/YF   History of Present Illness Visit Type: Initial Consult Primary GI MD: Yancey Flemings MD Primary Provider: Oliver Barre, MD Chief Complaint: Iron def anemia x 45month, with fatigue History of Present Illness:   50 year old white female with a history of anxiety/depression, hyperlipidemia, osteoarthritis, chronic migraine headaches, and nephrolithiasis. She is sent today regarding evaluation of new onset iron deficiency anemia. The patient had been feeling fatigued. Review of outside laboratory records shows anemia with a hemoglobin of 9.0. Normal white blood cell count. Mild elevation of platelets 442,000. Normal MCV of 85. Comprehensive metabolic panel was unremarkable except for a glucose of 67 and albumin 3.4. Urinalysis negative. Iron studies revealed iron deficiency with iron saturation of 2.3%. B12 levels also low at 209 (211-911). Folate was normal. She was started on oral iron as well as B12. She anticipates followup blood counts with Dr. Jonny Ruiz in about one month. She denies a prior history of GI problems. She does have intermittent problems with heartburn and indigestion for which she is on omeprazole. She complains of chronic intermittent nausea, bloating, and burning in the lower abdomen. No change in bowel habits. No melena or hematochezia. Denies weight loss. She is status post remote hysterectomy. She does not donate blood. She is accompanied today by her sister.   GI Review of Systems    Reports abdominal pain, bloating, nausea, and  vomiting.     Location of  Abdominal pain: lower abdomen.    Denies acid reflux, belching, chest pain, dysphagia with liquids, dysphagia with solids, heartburn, loss of appetite, vomiting blood, weight loss, and  weight gain.        Denies anal fissure, black tarry stools, change in bowel habit, constipation, diarrhea, diverticulosis, fecal incontinence, heme positive stool, hemorrhoids, irritable bowel syndrome, jaundice, light color  stool, liver problems, rectal bleeding, and  rectal pain. Preventive Screening-Counseling & Management      Drug Use:  no.      Current Medications (verified): 1)  Gabapentin 400 Mg Caps (Gabapentin) .Marland Kitchen.. 1 By Mouth Three Times A Day 2)  Alprazolam 0.5 Mg  Tabs (Alprazolam) .Marland Kitchen.. 1 By Mouth Two Times A Day As Needed - To Fill Mar 06, 2009 3)  Percocet 10-325 Mg  Tabs (Oxycodone-Acetaminophen) .Marland Kitchen.. 1 By Mouth Qid As Needed Pain - To Fill Jul 09, 2009 4)  Robaxin-750 750 Mg Tabs (Methocarbamol) .Marland Kitchen.. 1 By Mouth Two Times A Day As Needed 5)  Omeprazole 20 Mg  Cpdr (Omeprazole) .... 2 By Mouth Once Daily 6)  Trimethobenzamide Hcl 300 Mg Caps (Trimethobenzamide Hcl) .Marland Kitchen.. 1 By Mouth Four Times Per Day As Needed 7)  Lexapro 10 Mg Tabs (Escitalopram Oxalate) .Marland Kitchen.. 1po Once Daily  Allergies (verified): 1)  ! Voltaren 2)  ! Ibuprofen 3)  ! Effexor 4)  ! 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         entire review of systems otherwise reported to be negative  Vital Signs:  Patient profile:   50 year old female Height:      62 inches Weight:      107 pounds BMI:     19.64 Pulse rate:   60 /  minute Pulse rhythm:   regular BP sitting:   132 / 86  (left arm) Cuff size:   regular  Vitals Entered By: June McMurray CMA Duncan Dull) (July 11, 2009 9:14 AM)  Physical Exam  General:  Well developed, thinwell nourished, no acute distress. Head:  Normocephalic and atraumatic. Ears:  Normal auditory acuity. Nose:  No deformity, discharge,  or lesions. Mouth:  No deformity or lesions Neck:  Supple; no masses or thyromegaly. Lungs:  Clear throughout to auscultation. Heart:  Regular rate and rhythm; no murmurs, rubs,  or bruits. Abdomen:  Soft, nontender and nondistended. No masses, hepatosplenomegaly or hernias noted. Normal bowel sounds. Rectal:  deferred until colonoscopy Msk:  Symmetrical with no gross deformities. Normal posture. Pulses:  Normal pulses noted. Extremities:  No clubbing, cyanosis, edema or deformities noted. Neurologic:  Alert and  oriented x4;  grossly normal neurologically. Skin:  Intact without significant lesions or rashes. Cervical Nodes:  No significant cervical adenopathy.no supraclavicular adenopathy Psych:  Alert and cooperative. Normal mood and affect.   Impression & Recommendations:  Problem # 1:  ANEMIA-IRON DEFICIENCY (ICD-280.9) Iron deficiency anemia of uncertain etiology. Rule out occult GI mucosal lesions.  Plan: #1. Colonoscopy. The nature of the procedure as well as the risks, benefits, and alternatives have been reviewed. She understood and agreed to proceed #2. Movi prep prescribed. The patient instructed on its use #3. Upper endoscopy with possible duodenal biopsies. The nature of the procedure as well as the risks, benefits, and alternatives have been reviewed. She understood and agreed to proceed #4. Continue iron supplements #5. Keep followup plans with Dr. Jonny Ruiz  Problem # 2:  ANEMIA-B12 DEFICIENCY (ICD-281.1) etiology unclear. Agree with B12 replacement and follow up with Dr. Jonny Ruiz as planned  Problem # 3:  GERD  (ICD-530.81) chronic GERD. On omeprazole  Plan: #1. Reflux precautions #2. Stop smoking #3. Continue omeprazole #4. Upper endoscopy planned  Other Orders: Colon/Endo (Colon/Endo)  Patient Instructions: 1)  Colon/Endo [facility] 08/04/09 1:30 pm 2)  Movi prep instructions given to patient. 3)  Movi prep Rx. sent to patient's pharmacy to pick up. 4)  Colonoscopy and Flexible Sigmoidoscopy brochure given.  5)  Upper Endoscopy brochure given.  6)  The medication list was reviewed and reconciled.  All changed / newly prescribed medications were explained.  A complete medication list was provided to the patient / caregiver. 7)  Copy: Dr. Oliver Barre Prescriptions: MOVIPREP 100 GM  SOLR (PEG-KCL-NACL-NASULF-NA ASC-C) As per prep instructions.  #1 x 0   Entered by:   Milford Cage NCMA   Authorized by:   Hilarie Fredrickson MD   Signed by:   Milford Cage NCMA on 07/11/2009   Method used:   Electronically to        [facility]  [phone]* (retail)       [address]       [place], [place]  [address]       Ph: [phone] or [phone]       Fax: [phone]   RxID:   [id number]

## 2010-06-27 NOTE — Assessment & Plan Note (Signed)
Summary: BACK AND HIP PAIN  STC   Vital Signs:  Patient profile:   50 year old female Height:      62 inches Weight:      105.38 pounds BMI:     19.34 O2 Sat:      98 % on Room air Temp:     98.3 degrees F oral Pulse rate:   109 / minute BP sitting:   150 / 98  (left arm) Cuff size:   regular  Vitals Entered ByZella Ball Ewing (July 26, 2009 11:29 AM)  O2 Flow:  Room air CC: back and hip pain, labs/RE   Primary Care Provider:  Oliver Barre, MD  CC:  back and hip pain and labs/RE.  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; pain flare worse across the lower back, severe 8/10, worse left more than right as well as to get up from chair, bending or lifitng, not assoc with increased LE pain , weakness or numb; or fever, wt loss , night sweats bowel change (normal BM/s per pt) but has had some increase urinary freq for 2 wks, as well some dysuria worse 2 wks ago that has now improved;  also with lower abd pain and tenderness that she was thinking was more MSK similar to the lower back flare;  has been using more percocet recently up to 6 per day due to pain and has not overused in the past;  no fever, chills; had some mild nausea but no vomiting, and no overt bleeding, bruising or other blood loss.   Had recent iron def anemia, saw Dr Marina Goodell who plans GI w/u, not yet finished.    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) 13)  Fever Unspecified  (ICD-780.60) 14)  Elevated Blood  Pressure Without Diagnosis of Hypertension  (ICD-796.2) 15)  Lateral Epicondylitis, Left  (ICD-726.32) 16)  Dyspepsia  (ICD-536.8) 17)  Glossitis  (ICD-529.0) 18)  Elevated Blood Pressure Without Diagnosis of Hypertension  (ICD-796.2) 19)  Back Pain  (ICD-724.5) 20)  Frequency, Urinary  (ICD-788.41) 21)  Family History of Cad Female 1st Degree Relative <50  (ICD-V17.3) 22)  Osteoarthritis, Cervical Spine  (ICD-721.90) 23)  Insomnia  (ICD-780.52) 24)  Osteoarthritis  (ICD-715.90) 25)  Low Back Pain  (ICD-724.2) 26)  Hyperlipidemia  (ICD-272.4) 27)  Depression  (ICD-311) 28)  Anxiety  (ICD-300.00) 29)  Migraine Headache  (ICD-346.90)  Medications Prior to Update: 1)  Gabapentin 400 Mg Caps (Gabapentin) .Marland Kitchen.. 1 By Mouth Three Times A Day 2)  Alprazolam 0.5 Mg  Tabs (Alprazolam) .Marland Kitchen.. 1 By Mouth Two Times A Day As Needed - To Fill Mar 06, 2009 3)  Percocet 10-325 Mg  Tabs (Oxycodone-Acetaminophen) .Marland Kitchen.. 1 By Mouth Qid As Needed Pain - To Fill Jul 09, 2009 4)  Robaxin-750 750 Mg Tabs (Methocarbamol) .Marland Kitchen.. 1 By Mouth Two Times A Day As Needed 5)  Omeprazole 20 Mg  Cpdr (Omeprazole) .... 2 By Mouth Once Daily 6)  Trimethobenzamide Hcl 300  Mg Caps (Trimethobenzamide Hcl) .Marland Kitchen.. 1 By Mouth Four Times Per Day As Needed 7)  Lexapro 10 Mg Tabs (Escitalopram Oxalate) .Marland Kitchen.. 1po Once Daily 8)  Moviprep 100 Gm  Solr (Peg-Kcl-Nacl-Nasulf-Na Asc-C) .... As Per Prep Instructions.  Current Medications (verified): 1)  Gabapentin 400 Mg Caps (Gabapentin) .Marland Kitchen.. 1 By Mouth Three Times A Day 2)  Alprazolam 0.5 Mg  Tabs (Alprazolam) .Marland Kitchen.. 1 By Mouth Two Times A Day As Needed - To Fill Mar 06, 2009 3)  Percocet 10-325 Mg  Tabs (Oxycodone-Acetaminophen) .Marland Kitchen.. 1 By Mouth Qid As Needed Pain - To Fill Jul 26, 2009 4)  Robaxin-750 750 Mg Tabs (Methocarbamol) .Marland Kitchen.. 1 By Mouth Two Times A Day As Needed 5)  Omeprazole 20 Mg  Cpdr (Omeprazole) .... 2 By Mouth Once Daily 6)  Trimethobenzamide Hcl 300 Mg Caps (Trimethobenzamide Hcl)  .Marland Kitchen.. 1 By Mouth Four Times Per Day As Needed 7)  Effexor Xr 150 Mg Xr24h-Cap (Venlafaxine Hcl) .Marland Kitchen.. 1po Once Daily 8)  Moviprep 100 Gm  Solr (Peg-Kcl-Nacl-Nasulf-Na Asc-C) .... As Per Prep Instructions. 9)  Ciprofloxacin Hcl 500 Mg Tabs (Ciprofloxacin Hcl) .Marland Kitchen.. 1 By Mouth Two Times A Day  Allergies (verified): 1)  ! Voltaren 2)  ! Ibuprofen 3)  ! Effexor 4)  ! 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 Illicit Drug Use - no  Risk Factors: Smoking Status: quit (03/12/2007)  Past Medical History: Migraine Headaches Anxiety Depression Hyperlipidemia Low back pain Osteoarthritis Insomnia cervical spine djd Anemia-iron deficiency Kidney Stones B12 deficiency   Review of Systems  The patient denies anorexia, fever, vision loss, decreased hearing, hoarseness, chest pain, syncope, dyspnea on exertion, peripheral edema, prolonged cough, headaches, hemoptysis, melena, hematochezia, severe indigestion/heartburn, hematuria, muscle weakness, suspicious skin lesions, transient blindness, difficulty walking, unusual weight change, abnormal bleeding, enlarged lymph nodes, and angioedema.         all otherwise negative per pt -   Physical Exam  General:  alert and underweight appearing. ,mild ill appearing, wincing in pain to ascend the exam table. Head:  normocephalic and atraumatic.   Eyes:  vision grossly intact, pupils equal, and pupils round.   Ears:  R ear normal and L ear normal.   Nose:  no external deformity and no nasal discharge.   Mouth:  no gingival abnormalities and pharynx pink and moist.   Neck:  supple and no masses.     Lungs:  normal respiratory effort and normal breath sounds.   Heart:  normal rate and regular rhythm.   Abdomen:  soft, normal bowel sounds, no masses, no guarding, and no rigidity.  but mild epiastric tender noted, as well as moderate tender to lower mid abd and left lower quad area Msk:  no spine tender throughout, but marked bilat paravertebral tender (no swelling or red or rash) left more than right Extremities:  no edema, no erythema  Neurologic:  exam somewhat limited due to lower back pain, but appears to have intact LE motor, sensory and dtr's Psych:  depressed affect and slightly anxious.     Impression & Recommendations:  Problem # 1:  ABDOMINAL PAIN, LEFT LOWER QUADRANT (ICD-789.04)  and low mid abd with urinary freq recent,  but tenderness out  of proportion I think to simple UTI/cystitis - cant r/o diverticulitis, stone  or other though she is afebrile; also cant r/o ovary issue;  will check urgent labs and urine cx, start empiric cipro course  Orders: TLB-BMP (Basic Metabolic Panel-BMET) (80048-METABOL) TLB-CBC Platelet - w/Differential (85025-CBCD) TLB-Hepatic/Liver Function Pnl (80076-HEPATIC) TLB-Lipase (83690-LIPASE) TLB-Udip w/ Micro (81001-URINE) T-Culture, Urine ([id number]) Radiology Referral (Radiology)  Problem # 2:  ANEMIA-IRON DEFICIENCY (ICD-280.9) to re-check as above , f/u colon /endo as planned, pending CT results  Problem # 3:  BACK PAIN (ICD-724.5)  Her updated medication list for this problem includes:    Percocet 10-325 Mg Tabs (Oxycodone-acetaminophen) .Marland Kitchen... 1 by mouth qid as needed pain - to fill Jul 26, 2009    Robaxin-750 750 Mg Tabs (Methocarbamol) .Marland Kitchen... 1 by mouth two times a day as needed acute on chronic lower back, worse but not enough to explain the abd findings;  ok to refill pain meds early this time and she has not abused in the past;  advised against overuse in the future; exam o/w as above  - consider MRI if persists, and/or ortho  eval  Problem # 4:  ANEMIA-B12 DEFICIENCY (ICD-281.1) also to start b12 shots IM monthly - likely inicidental recent finding  Problem # 5:  DEPRESSION (ICD-311)  Her updated medication list for this problem includes:    Alprazolam 0.5 Mg Tabs (Alprazolam) .Marland Kitchen... 1 by mouth two times a day as needed - to fill Mar 06, 2009    Effexor Xr 150 Mg Xr24h-cap (Venlafaxine hcl) .Marland Kitchen... 1po once daily pt requests to change back to effexor as lexpro not working as well  Complete Medication List: 1)  Gabapentin 400 Mg Caps (Gabapentin) .Marland Kitchen.. 1 by mouth three times a day 2)  Alprazolam 0.5 Mg Tabs (Alprazolam) .Marland Kitchen.. 1 by mouth two times a day as needed - to fill Mar 06, 2009 3)  Percocet 10-325 Mg Tabs (Oxycodone-acetaminophen) .Marland Kitchen.. 1 by mouth qid as needed pain - to fill Jul 26, 2009 4)  Robaxin-750 750 Mg Tabs (Methocarbamol) .Marland Kitchen.. 1 by mouth two times a day as needed 5)  Omeprazole 20 Mg Cpdr (Omeprazole) .... 2 by mouth once daily 6)  Trimethobenzamide Hcl 300 Mg Caps (Trimethobenzamide hcl) .Marland Kitchen.. 1 by mouth four times per day as needed 7)  Effexor Xr 150 Mg Xr24h-cap (Venlafaxine hcl) .Marland Kitchen.. 1po once daily 8)  Moviprep 100 Gm Solr (Peg-kcl-nacl-nasulf-na asc-c) .... As per prep instructions. 9)  Ciprofloxacin Hcl 500 Mg Tabs (Ciprofloxacin hcl) .Marland Kitchen.. 1 by mouth two times a day  Other Orders: Vit B12 1000 mcg (J3420) Admin of Therapeutic Inj  intramuscular or subcutaneous (62130)  Patient Instructions: 1)  you had the b12 shot today 2)  stop the lexapro 3)  start the effexor  4)  start the cipro antibiotic for possible bladder infection 5)  Please go to the Lab in the basement for your blood and/or urine tests today 6)  You will be contacted about the referral(s) to: CT scan 7)  You are given the pain med refill early this time 8)  Please schedule a follow-up appointment in 1 weeks. Prescriptions: CIPROFLOXACIN HCL 500 MG TABS (CIPROFLOXACIN HCL) 1 by mouth two times a day  #20 x 0   Entered and  Authorized by:   Corwin Levins MD   Signed by:   Corwin Levins MD on 07/26/2009   Method used:   Print then Give to Patient   RxID:   [id number] EFFEXOR XR 150  MG XR24H-CAP (VENLAFAXINE HCL) 1po once daily  #30 x 11   Entered and Authorized by:   Corwin Levins MD   Signed by:   Corwin Levins MD on 07/26/2009   Method used:   Print then Give to Patient   RxID:   [id number] PERCOCET 10-325 MG  TABS (OXYCODONE-ACETAMINOPHEN) 1 by mouth qid as needed pain - to fill Jul 26, 2009  #120 x 0   Entered and Authorized by:   Corwin Levins MD   Signed by:   Corwin Levins MD on 07/26/2009   Method used:   Print then Give to Patient   RxID:   [id number]    Medication Administration  Injection # 1:    Medication: Vit B12 1000 mcg    Diagnosis: VITAMIN B12 DEFICIENCY (ICD-266.2)    Route: IM    Site: L deltoid    Exp Date: 01/2011    Lot #: [id number]    Mfr: [employer]    Given by: Zella Ball Ewing (July 26, 2009 12:14 PM)  Orders Added: 1)  TLB-BMP (Basic Metabolic Panel-BMET) [80048-METABOL] 2)  TLB-CBC Platelet - w/Differential [85025-CBCD] 3)  TLB-Hepatic/Liver Function Pnl [80076-HEPATIC] 4)  TLB-Lipase [83690-LIPASE] 5)  TLB-Udip w/ Micro [81001-URINE] 6)  T-Culture, Urine [[id number]] 7)  Radiology Referral [Radiology] 8)  Vit B12 1000 mcg [J3420] 9)  Admin of Therapeutic Inj  intramuscular or subcutaneous [96372] 10)  Est. Patient Level V [44010]

## 2010-06-27 NOTE — Letter (Signed)
Summary: Out of Work  [facility]  [address] [place], [place] [address]   Phone: [phone]  Fax: [phone]    November 29, 2009   Employee:  Leo Grosser    To Whom It May Concern:   For Medical reasons, please excuse the above named employee from work for the following dates:  Start:   11/29/2009  End:   12/05/2009  Return to work December 05, 2009  If you need additional information, please feel free to contact our office.         Sincerely,    Dr. Oliver Barre

## 2010-06-27 NOTE — Progress Notes (Signed)
Summary: Michelle [name] pt  Phone Note Call from Patient Call back at Home Phone [phone]   Caller: Patient Summary of Call: pt called requesting refills of Xanax.  Initial call taken by: Margaret Pyle, CMA,  July 19, 2009 1:40 PM  Follow-up for Phone Call        i printed Follow-up by: Minus Breeding MD,  July 19, 2009 2:05 PM  Additional Follow-up for Phone Call Additional follow up Details #1::        pt informed Additional Follow-up by: Margaret Pyle, CMA,  July 19, 2009 2:06 PM    Prescriptions: ALPRAZOLAM 0.5 MG  TABS (ALPRAZOLAM) 1 by mouth two times a day as needed - to fill Mar 06, 2009  #60 x 3   Entered and Authorized by:   Minus Breeding MD   Signed by:   Minus Breeding MD on 07/19/2009   Method used:   Print then Give to Patient   RxID:   [id number]
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
## 2010-06-27 NOTE — Assessment & Plan Note (Signed)
Summary: physical---stc   Vital Signs:  Patient profile:   50 year old female Height:      62.5 inches Weight:      108 pounds BMI:     19.51 O2 Sat:      99 % on Room air Temp:     98 degrees F oral Pulse rate:   87 / minute BP sitting:   152 / 102  (left arm) Cuff size:   regular  Vitals Entered ByMarland Kitchen Zella Ball Ewing (June 21, 2009 10:52 AM)  O2 Flow:  Room air  CC: Adult physical/RE   CC:  Adult physical/RE.  History of Present Illness: citalopram makes her feel "wierd" ;  here with increased stress recently separated from husband and living with friend with incrased depressive symtpoms but no suicidal ideation or panic;  good complaince with meds, no illicit drug use, unable to quit smoking at this time.  Some wt loss overall and more tremulous with stress.  Due for tetanus today, needs form filled out for work documentation.  BP at home usually < 140/90.  Has some occasionally fleeting sharp and dull chest pains for several wks in different locations, no radiation, sob, doe, orhtopnea, pnd or worsening LE edema.    Problems Prior to Update: 1)  Chest Pain  (ICD-786.50) 2)  Anemia-iron Deficiency  (ICD-280.9) 3)  Preventive Health Care  (ICD-V70.0) 4)  Disc Disease, Cervical  (ICD-722.4) 5)  Fever Unspecified  (ICD-780.60) 6)  Elevated Blood Pressure Without Diagnosis of Hypertension  (ICD-796.2) 7)  Lateral Epicondylitis, Left  (ICD-726.32) 8)  Dyspepsia  (ICD-536.8) 9)  Glossitis  (ICD-529.0) 10)  Elevated Blood Pressure Without Diagnosis of Hypertension  (ICD-796.2) 11)  Back Pain  (ICD-724.5) 12)  Frequency, Urinary  (ICD-788.41) 13)  Family History of Cad Female 1st Degree Relative <50  (ICD-V17.3) 14)  Osteoarthritis, Cervical Spine  (ICD-721.90) 15)  Insomnia  (ICD-780.52) 16)  Osteoarthritis  (ICD-715.90) 17)  Low Back Pain  (ICD-724.2) 18)  Hyperlipidemia  (ICD-272.4) 19)  Depression  (ICD-311) 20)  Anxiety  (ICD-300.00) 21)  Migraine Headache   (ICD-346.90)  Medications Prior to Update: 1)  Gabapentin 400 Mg Caps (Gabapentin) .Marland Kitchen.. 1 By Mouth Three Times A Day 2)  Alprazolam 0.5 Mg  Tabs (Alprazolam) .Marland Kitchen.. 1 By Mouth Two Times A Day As Needed - To Fill Mar 06, 2009 3)  Percocet 10-325 Mg  Tabs (Oxycodone-Acetaminophen) .Marland Kitchen.. 1 By Mouth Qid As Needed Pain - To Fill Jul 09, 2009 4)  Robaxin-750 750 Mg Tabs (Methocarbamol) .Marland Kitchen.. 1 By Mouth Two Times A Day As Needed 5)  Omeprazole 20 Mg  Cpdr (Omeprazole) .... 2 By Mouth Once Daily 6)  Trimethobenzamide Hcl 300 Mg Caps (Trimethobenzamide Hcl) .Marland Kitchen.. 1 By Mouth Four Times Per Day As Needed 7)  Citalopram Hydrobromide 10 Mg Tabs (Citalopram Hydrobromide) .Marland Kitchen.. 1 By Mouth Once Daily  Current Medications (verified): 1)  Gabapentin 400 Mg Caps (Gabapentin) .Marland Kitchen.. 1 By Mouth Three Times A Day 2)  Alprazolam 0.5 Mg  Tabs (Alprazolam) .Marland Kitchen.. 1 By Mouth Two Times A Day As Needed - To Fill Mar 06, 2009 3)  Percocet 10-325 Mg  Tabs (Oxycodone-Acetaminophen) .Marland Kitchen.. 1 By Mouth Qid As Needed Pain - To Fill Jul 09, 2009 4)  Robaxin-750 750 Mg Tabs (Methocarbamol) .Marland Kitchen.. 1 By Mouth Two Times A Day As Needed 5)  Omeprazole 20 Mg  Cpdr (Omeprazole) .... 2 By Mouth Once Daily 6)  Trimethobenzamide Hcl 300 Mg Caps (Trimethobenzamide Hcl) .Marland Kitchen.. 1 By  Mouth Four Times Per Day As Needed 7)  Lexapro 10 Mg Tabs (Escitalopram Oxalate) .Marland Kitchen.. 1po Once Daily  Allergies (verified): 1)  ! Voltaren 2)  ! Ibuprofen 3)  ! Effexor 4)  ! 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   Risk Factors: Smoking Status: quit (03/12/2007)  Past Medical History: Migraine Headaches Anxiety Depression Hyperlipidemia Low back  pain Osteoarthritis Insomnia cervical spine djd Anemia-iron deficiency  Past Surgical History: R Ankle Surgery Hysterectomy - approx 2004 s/p c-spine fusion c5-6  Social History: Current Smoker 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         all otherwise negative per pt - except for ongoing fatigue and no OSA symptpoms  Physical Exam  General:  alert and underweight appearing.   Head:  normocephalic and atraumatic.   Eyes:  vision grossly intact, pupils equal, and pupils round.   Ears:  R ear normal and L ear normal.   Nose:  no external deformity and no nasal discharge.   Mouth:  no gingival abnormalities and pharynx pink and moist.   Neck:  supple and no masses.   Lungs:  normal respiratory effort and normal breath sounds.   Heart:  normal rate and regular rhythm.   Abdomen:  soft, non-tender, and normal bowel sounds.   Msk:  no joint tenderness and no joint swelling.   Extremities:  no edema, no erythema  Neurologic:  cranial nerves II-XII intact and strength normal in all extremities.   Skin:  color normal and no rashes.   Psych:  depressed affect and moderately anxious.     Impression & Recommendations:  Problem # 1:  Preventive Health Care (ICD-V70.0)  Overall doing well, age appropriate education and counseling updated and referral for appropriate preventive services done unless declined, immunizations up to date or declined, diet counseling done if  overweight, urged to quit smoking if smokes , most recent labs reviewed and current ordered if appropriate, ecg reviewed or declined (interpretation per ECG scanned in the EMR if done); information regarding Medicare Prevention requirements given if appropriate   Orders: EKG w/ Interpretation (93000)  Problem # 2:  DEPRESSION (ICD-311)  Her updated medication list for this problem includes:    Alprazolam 0.5 Mg Tabs (Alprazolam) .Marland Kitchen... 1 by mouth two times a day as needed - to fill Mar 06, 2009    Lexapro 10 Mg Tabs (Escitalopram oxalate) .Marland Kitchen... 1po once daily change to lexapro   Problem # 3:  ANEMIA-IRON DEFICIENCY (ICD-280.9)  for iron, no obvious bleeding, will refer GI  Orders: Gastroenterology Referral (GI)  Hgb: 9.0 (06/14/2009)   Hct: 29.6 (06/14/2009)   Platelets: 442.0 (06/14/2009) RBC: 3.48 (06/14/2009)   RDW: 15.3 (06/14/2009)   WBC: 8.3 (06/14/2009) MCV: 85.0 (06/14/2009)   MCHC: 30.3 L g/dL (16/02/9603) Iron: 10 (06/14/2009)   % Sat: 2.3 (06/14/2009) B12: 209 (06/14/2009)   Folate: 12.7 (  06/14/2009)   TSH: 3.60 (06/14/2009)  Problem # 4:  CHEST PAIN (ICD-786.50) vague and atypical, urged to quit smoking, to check cxr Orders: T-2 View CXR, Same Day (71020.5TC)  Problem # 5:  ELEVATED BLOOD PRESSURE WITHOUT DIAGNOSIS OF HYPERTENSION (ICD-796.2) f/u BP today 138/72 after rest; ok to follow  Complete Medication List: 1)  Gabapentin 400 Mg Caps (Gabapentin) .Marland Kitchen.. 1 by mouth three times a day 2)  Alprazolam 0.5 Mg Tabs (Alprazolam) .Marland Kitchen.. 1 by mouth two times a day as needed - to fill Mar 06, 2009 3)  Percocet 10-325 Mg Tabs (Oxycodone-acetaminophen) .Marland Kitchen.. 1 by mouth qid as needed pain - to fill Jul 09, 2009 4)  Robaxin-750 750 Mg Tabs (Methocarbamol) .Marland Kitchen.. 1 by mouth two times a day as needed 5)  Omeprazole 20 Mg Cpdr (Omeprazole) .... 2 by mouth once daily 6)  Trimethobenzamide Hcl 300 Mg Caps (Trimethobenzamide hcl) .Marland Kitchen.. 1 by mouth four times per day as needed 7)  Lexapro  10 Mg Tabs (Escitalopram oxalate) .Marland Kitchen.. 1po once daily  Other Orders: Tdap => 87yrs IM (16109) Admin 1st Vaccine (60454)  Patient Instructions: 1)  stop the citalopram 2)  star the lexapro at 10 mg per day 3)  please call for your yearly pap smear - considier [facility]  4)  please call for your yearly mammogram  - consider [facility] on [address] 5)  please take iron sulfate 325 mg - 1 per day - OTC 6)  please return in 4 wks for LAB only: 7)  CBC w/ Diff prior to visit, ICD-9: 285.9 8)  b12/folate:  285.9 9)  sed rate 285.9 10)  You will be contacted about the referral(s) to: GI appt 11)  Please go to Radiology in the basement level for your X-Ray today  12)  you had the tetanus shot today 13)  your form will be completed and faxed in 14)  Please schedule a follow-up appointment in 2 months with: 15)  CBC and iron/tibc:   285.9    -  or sooner if needed Prescriptions: LEXAPRO 10 MG TABS (ESCITALOPRAM OXALATE) 1po once daily  #30 x 11   Entered and Authorized by:   Corwin Levins MD   Signed by:   Corwin Levins MD on 06/21/2009   Method used:   Print then Give to Patient   RxID:   [id number]    Immunizations Administered:  Tetanus Vaccine:    Vaccine Type: Tdap    Site: right deltoid    Mfr: GlaxoSmithKline    Dose: 0.5 ml    Route: IM    Given by: Robin Ewing    Exp. Date: 07/23/2011    Lot #: [id number]    VIS given: 04/15/07 version given June 21, 2009.

## 2010-06-27 NOTE — Progress Notes (Signed)
Summary: Percocet  Phone Note Call from Patient Call back at [facility] Phone [phone]   Caller: Patient Summary of Call: Pt called requesting refill of pain meds. Pt is aware that request is early but she states she is leaving to go out of town Tuesday evening and would like to pick up Rx tomorrow, due Nov 4th. Initial call taken by: Margaret Pyle, CMA,  March 27, 2010 12:59 PM  Follow-up for Phone Call        ok  this time only - done hardcopy to LIM side B - dahlia  Follow-up by: Corwin Levins MD,  March 27, 2010 1:06 PM  Additional Follow-up for Phone Call Additional follow up Details #1::        pt informed. Rx in cabinet upfront for pickup Additional Follow-up by: Brenton Grills CMA Duncan Dull),  March 27, 2010 1:26 PM    New/Updated Medications: PERCOCET 10-325 MG  TABS (OXYCODONE-ACETAMINOPHEN) 1/2 - 1 by mouth qid as needed pain - to fill Mar 27, 2010 Prescriptions: PERCOCET 10-325 MG  TABS (OXYCODONE-ACETAMINOPHEN) 1/2 - 1 by mouth qid as needed pain - to fill Mar 27, 2010  #120 x 0   Entered and Authorized by:   Corwin Levins MD   Signed by:   Corwin Levins MD on 03/27/2010   Method used:   Print then Give to Patient   RxID:   [id number]

## 2010-06-27 NOTE — Progress Notes (Signed)
Summary: FYI  Phone Note Call from Patient Call back at [facility] Phone [phone]   Caller: Patient Summary of Call: pt called to inform MD that she will have OV notes from GYN and mammogram results sent to [facility] for MD's review and to help with paperwork left at last OV. I advised pt that once paperwork is filled out it will be faxed. Initial call taken by: Margaret Pyle, CMA,  June 22, 2009 4:32 PM

## 2010-06-27 NOTE — Progress Notes (Signed)
Summary: Ricci Barker PT  Phone Note Call from Patient Call back at Home Phone [phone]   Caller: Patient Call For: Corwin Levins MD Summary of Call: Pt left message on triage B requesting to pick up prescription for Percocet today. Please advise. Initial call taken by: Verdell Face,  January 27, 2010 8:15 AM  Follow-up for Phone Call        ok to ref for 9/4 Keep return office visit Dr Jonny Ruiz Follow-up by: Tresa Garter MD,  January 27, 2010 12:52 PM  Additional Follow-up for Phone Call Additional follow up Details #1::        left mess to call office back........Marland KitchenLamar Sprinkles, CMA  January 27, 2010 3:25 PM   Pt informed to pick up  Additional Follow-up by: Lamar Sprinkles, CMA,  January 27, 2010 3:48 PM    New/Updated Medications: PERCOCET 10-325 MG  TABS (OXYCODONE-ACETAMINOPHEN) 1/2 - 1 by mouth qid as needed pain - to fill Sept 4, 2011 Prescriptions: PERCOCET 10-325 MG  TABS (OXYCODONE-ACETAMINOPHEN) 1/2 - 1 by mouth qid as needed pain - to fill Sept 4, 2011  #120 x 0   Entered by:   Lamar Sprinkles, CMA   Authorized by:   Tresa Garter MD   Signed by:   Lamar Sprinkles, CMA on 01/27/2010   Method used:   Print then Give to Patient   RxID:   [id number]

## 2010-06-27 NOTE — Progress Notes (Signed)
Summary: REFILL - PERCOCET  Phone Note Refill Request Call back at [phone]   Refills Requested: Medication #1:  PERCOCET 10-325 MG  TABS 1/2 - 1 by mouth qid as needed pain - to fill Sept 4 Initial call taken by: Lamar Sprinkles, CMA,  February 27, 2010 9:00 AM  Follow-up for Phone Call        done hardcopy to LIM side B - dahlia  Follow-up by: Corwin Levins MD,  February 27, 2010 1:04 PM  Additional Follow-up for Phone Call Additional follow up Details #1::        Pt informed, Rx in cabinet for pt pick up Additional Follow-up by: Margaret Pyle, CMA,  February 27, 2010 1:11 PM    New/Updated Medications: PERCOCET 10-325 MG  TABS (OXYCODONE-ACETAMINOPHEN) 1/2 - 1 by mouth qid as needed pain - to fill Feb 28, 2010 Prescriptions: PERCOCET 10-325 MG  TABS (OXYCODONE-ACETAMINOPHEN) 1/2 - 1 by mouth qid as needed pain - to fill Feb 28, 2010  #120 x 0   Entered and Authorized by:   Corwin Levins MD   Signed by:   Corwin Levins MD on 02/27/2010   Method used:   Print then Give to Patient   RxID:   [id number]

## 2010-06-27 NOTE — Progress Notes (Signed)
  Phone Note Refill Request Message from:  Fax from Pharmacy on December 19, 2009 8:25 AM  Refills Requested: Medication #1:  TRIMETHOBENZAMIDE HCL 300 MG CAPS 1 by mouth four times per day as needed   Dosage confirmed as above?Dosage Confirmed   Last Refilled: 09/22/2009   Notes: [facility] Initial call taken by: Robin Ewing CMA Duncan Dull),  December 19, 2009 8:26 AM    Prescriptions: TRIMETHOBENZAMIDE HCL 300 MG CAPS (TRIMETHOBENZAMIDE HCL) 1 by mouth four times per day as needed  #40 x 3   Entered by:   Scharlene Gloss CMA (AAMA)   Authorized by:   Corwin Levins MD   Signed by:   Scharlene Gloss CMA (AAMA) on 12/19/2009   Method used:   Faxed to ...       [facility]  [phone]* (retail)       [address] [place], [place]  [address]       Ph: [phone] or [phone]       Fax: [phone]   RxID:   [id number]

## 2010-06-27 NOTE — Progress Notes (Signed)
Summary: Percocet  Phone Note Refill Request Message from:  Patient on December 29, 2009 8:35 AM  Refills Requested: Medication #1:  PERCOCET 10-325 MG  TABS 1/2 - 1 by mouth qid as needed pain - to fill july 5   Dosage confirmed as above?Dosage Confirmed   Supply Requested: 1 month  Method Requested: Pick up at Office Initial call taken by: Margaret Pyle, CMA,  December 29, 2009 8:35 AM  Follow-up for Phone Call        done hardcopy to LIM side B - dahlia  Follow-up by: Corwin Levins MD,  December 29, 2009 12:42 PM  Additional Follow-up for Phone Call Additional follow up Details #1::        Pt informed via VM, Rx in cabinet for pt pick up Additional Follow-up by: Margaret Pyle, CMA,  December 29, 2009 1:05 PM    New/Updated Medications: PERCOCET 10-325 MG  TABS (OXYCODONE-ACETAMINOPHEN) 1/2 - 1 by mouth qid as needed pain - to fill Dec 29, 2009 Prescriptions: PERCOCET 10-325 MG  TABS (OXYCODONE-ACETAMINOPHEN) 1/2 - 1 by mouth qid as needed pain - to fill Dec 29, 2009  #120 x 0   Entered and Authorized by:   Corwin Levins MD   Signed by:   Corwin Levins MD on 12/29/2009   Method used:   Print then Give to Patient   RxID:   [id number]

## 2010-06-27 NOTE — Consult Note (Signed)
Summary: [facility]  [facility]   Imported By: Lester Elroy 10/13/2009 10:14:07  _____________________________________________________________________  External Attachment:    Type:   Image     Comment:   External Document

## 2010-06-27 NOTE — Letter (Signed)
Summary: [facility]   Imported By: Lester Marengo 06/29/2009 08:47:25  _____________________________________________________________________  External Attachment:    Type:   Image     Comment:   External Document

## 2010-06-27 NOTE — Letter (Signed)
Summary: Patient [facility] Biopsy Results  [facility]  [address] [place], [place] [address]   Phone: [phone]  Fax: [phone]        August 11, 2009 MRN: [id number]    Michelle [name] [address] [place], [place]  [address]    Dear Ms. Mcclune,  I am pleased to inform you that the biopsies taken during your recent endoscopic examination WERE NORMAL.  Additional information/recommendations:  __No further action is needed at this time.  Please follow-up with      your primary care physician for your other healthcare needs.   __ Continue with the treatment plan as outlined on the day of your      exam.   Please call us if you are having persistent problems or have questions about your condition that have not been fully answered at this time.  Sincerely,  Hilarie Fredrickson MD  This letter has been electronically signed by your physician.  Appended Document: Patient Notice-Endo Biopsy Results letter mailed 3.18.11

## 2010-06-27 NOTE — Progress Notes (Signed)
Summary: Percocet  Phone Note Call from Patient   Caller: Patient [phone] Summary of Call: Pt called requesting refills of Percocet Initial call taken by: Margaret Pyle, CMA,  November 03, 2009 10:46 AM  Follow-up for Phone Call        Pt spouse informed, Rx in cabinet for pick up Follow-up by: Margaret Pyle, CMA,  November 03, 2009 1:22 PM    New/Updated Medications: PERCOCET 10-325 MG  TABS (OXYCODONE-ACETAMINOPHEN) 1/2 - 1 by mouth qid as needed pain - to fill November 03, 2009 Prescriptions: PERCOCET 10-325 MG  TABS (OXYCODONE-ACETAMINOPHEN) 1/2 - 1 by mouth qid as needed pain - to fill November 03, 2009  #120 x 0   Entered and Authorized by:   Corwin Levins MD   Signed by:   Corwin Levins MD on 11/03/2009   Method used:   Print then Give to Patient   RxID:   [id number]  done hardcopy to LIM side B - dahlia Corwin Levins MD  November 03, 2009 1:12 PM

## 2010-06-27 NOTE — Letter (Signed)
Summary: New Patient letter  [facility]  [address] [place], [place] [address]   Phone: [phone]  Fax: [phone]       06/21/2009 MRN: [id number]  Michelle [name] [address] [place], [place]  [address]  Dear Ms. Egelhoff,  Welcome to the Gastroenterology Division at [facility].    You are scheduled to see Dr.  Marina Goodell on 07-11-09 at 9:15am on the 3rd floor at [facility], [address]  We ask that you try to arrive at our office 15 minutes prior to your appointment time to allow for check-in.  We would like you to complete the enclosed self-administered evaluation form prior to your visit and bring it with you on the day of your appointment.  We will review it with you.  Also, please bring a complete list of all your medications or, if you prefer, bring the medication bottles and we will list them.  Please bring your insurance card so that we may make a copy of it.  If your insurance requires a referral to see a specialist, please bring your referral form from your primary care physician.  Co-payments are due at the time of your visit and may be paid by cash, check or credit card.     Your office visit will consist of a consult with your physician (includes a physical exam), any laboratory testing he/she may order, scheduling of any necessary diagnostic testing (e.g. x-ray, ultrasound, CT-scan), and scheduling of a procedure (e.g. Endoscopy, Colonoscopy) if required.  Please allow enough time on your schedule to allow for any/all of these possibilities.    If you cannot keep your appointment, please call [phone] to cancel or reschedule prior to your appointment date.  This allows [place] the opportunity to schedule an appointment for another patient in need of care.  If you do not cancel or reschedule by 5 p.m. the business day prior to your appointment date, you will be charged a $50.00 late cancellation/no-show fee.    Thank you for choosing  [facility] for your medical needs.  We appreciate the opportunity to care for you.  Please visit [place] at our website  to learn more about our practice.                     Sincerely,                                                             The Gastroenterology Division

## 2010-06-27 NOTE — Assessment & Plan Note (Signed)
Summary: neck pain/cd   Vital Signs:  Patient profile:   50 year old female Height:      62 inches Weight:      102.25 pounds BMI:     18.77 O2 Sat:      97 % on Room air Temp:     98 degrees F oral Pulse rate:   105 / minute BP sitting:   140 / 80  (left arm) Cuff size:   regular  Vitals Entered ByZella Ball Ewing (August 30, 2009 9:06 AM)  O2 Flow:  Room air CC: Neck Pain/RE   Primary Care Provider:  Oliver Barre, MD  CC:  Neck Pain/RE.  History of Present Illness: here wit 10 days onset pain to neck , mid lower cspine, cant get into see DR Jeral Fruit until apr 28;  pain severe, worse to all movement, cant sleep, very diffictuly to work but not missed work - works at [employer]; does a lot of work but at least no heavy lifting  no radiation of the pain to head , shoudler or arms;  no UE pain, weakness or numbn except for bilat tennis elboiw mild;  no loss of  grip strip, no droopping things; no changes in bowel or bladder funciton, no fall so injury;  no fever, unusual wt loss or other constitutinal symptoms.  Is s/p c-spine surgury appro 4 yrs ago.  Last MRI june 2010. Left handed.    Also with worsening insomnia, she thinks maybe due to pain, and stress.  No worsening depressive symtpoms, suicidal ideation ,  or panic.    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) 2)  Vitamin B12 Deficiency  (ICD-266.2) 3)  Abdominal Pain, Left Lower Quadrant  (ICD-789.04) 4)  Abdominal Pain Right Lower Quadrant  (ICD-789.03) 5)  Gerd  (ICD-530.81) 6)  Anemia-iron Deficiency  (ICD-280.9) 7)  Anemia-b12 Deficiency  (ICD-281.1) 8)  Gerd  (ICD-530.81) 9)  Anemia-b12 Deficiency  (ICD-281.1) 10)  Chest Pain  (ICD-786.50) 11)  Anemia-iron Deficiency  (ICD-280.9) 12)  Preventive Health Care  (ICD-V70.0) 13)  Disc Disease, Cervical  (ICD-722.4) 14)  Fever Unspecified  (ICD-780.60) 15)  Elevated Blood Pressure Without Diagnosis of Hypertension   (ICD-796.2) 16)  Lateral Epicondylitis, Left  (ICD-726.32) 17)  Dyspepsia  (ICD-536.8) 18)  Glossitis  (ICD-529.0) 19)  Elevated Blood Pressure Without Diagnosis of Hypertension  (ICD-796.2) 20)  Back Pain  (ICD-724.5) 21)  Frequency, Urinary  (ICD-788.41) 22)  Family History of Cad Female 1st Degree Relative <50  (ICD-V17.3) 23)  Osteoarthritis, 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 1 By Mouth Three Times A Day 2)  Alprazolam 0.5 Mg  Tabs (Alprazolam) .Marland Kitchen.. 1 By Mouth Two Times A Day As Needed - To Fill Mar 06, 2009 3)  Percocet 10-325 Mg  Tabs (Oxycodone-Acetaminophen) .Marland Kitchen.. 1 By Mouth Qid As Needed Pain - To Fill Jul 26, 2009 4)  Robaxin-750 750 Mg Tabs (Methocarbamol) .Marland Kitchen.. 1 By Mouth Two Times A Day As Needed 5)  Omeprazole 20 Mg  Cpdr (Omeprazole) .... 2 By Mouth Once Daily 6)  Trimethobenzamide Hcl 300 Mg Caps (Trimethobenzamide Hcl) .Marland Kitchen.. 1 By Mouth Four Times Per Day As Needed 7)  Effexor Xr 150 Mg Xr24h-Cap (Venlafaxine Hcl) .Marland Kitchen.. 1po Once Daily 8)  Ciprofloxacin Hcl 500 Mg Tabs (Ciprofloxacin Hcl) .Marland Kitchen.. 1 By Mouth Two Times A Day  Current Medications (verified): 1)  Gabapentin 400 Mg Caps (Gabapentin) .Marland Kitchen.. 1 By Mouth Three Times A Day 2)  Alprazolam 0.5 Mg  Tabs (Alprazolam) .Marland Kitchen.. 1 By Mouth Two Times A Day As Needed - To Fill Mar 06, 2009 3)  Percocet 10-325 Mg  Tabs (Oxycodone-Acetaminophen) .... 1/2 - 1 By Mouth Qid As Needed Pain - To Fill Aug 30, 2009 4)  Robaxin-750 750 Mg Tabs (Methocarbamol) .Marland Kitchen.. 1 By Mouth Two Times A Day As Needed 5)  Omeprazole 20 Mg  Cpdr (Omeprazole) .... 2 By Mouth Once Daily 6)  Trimethobenzamide Hcl 300 Mg Caps (Trimethobenzamide Hcl) .Marland Kitchen.. 1 By Mouth Four Times Per Day As  Needed 7)  Effexor Xr 150 Mg Xr24h-Cap (Venlafaxine Hcl) .Marland Kitchen.. 1po Once Daily 8)  Zolpidem Tartrate 10 Mg Tabs (Zolpidem Tartrate) .Marland Kitchen.. 1po At Bedtime As Needed  Allergies (verified): 1)  ! Voltaren 2)  ! Ibuprofen 3)  ! Effexor 4)  ! 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   Eyes:  vision grossly intact, pupils equal, and pupils round.   Ears:  R ear normal and L ear normal.   Nose:  no external deformity and no nasal discharge.   Mouth:  no gingival abnormalities and pharynx pink and moist.   Neck:  supple and no masses.   Lungs:  normal respiratory effort and normal breath sounds.   Heart:  normal rate and regular rhythm.   Msk:  neck with midline tender and left post lat tenderness approx c4-7 area without swelling or rash Extremities:  no edema, no erythema  Neurologic:  alert & oriented X3, cranial nerves II-XII intact, strength normal in all upper extremities, sensation intact to light touch, and DTRs symmetrical and normal.   Psych:  not depressed appearing and moderately anxious.     Impression &  Recommendations:  Problem # 1:  DISC DISEASE, CERVICAL (ICD-722.4) with pain exacerbation by exam, neuro exam stable ;  to refill pain meds, has robaxin at home but not used recently, Continue all previous medications as before this visit , f/u dr Jeral Fruit as planned  Problem # 2:  INSOMNIA-SLEEP DISORDER-UNSPEC (ICD-780.52)  Her updated medication list for this problem includes:    Zolpidem Tartrate 10 Mg Tabs (Zolpidem tartrate) .Marland Kitchen... 1po at bedtime as needed treat as above, f/u any worsening signs or symptoms   Problem # 3:  DEPRESSION (ICD-311)  Her updated medication list for this problem includes:    Alprazolam 0.5 Mg Tabs (Alprazolam) .Marland Kitchen... 1 by mouth two times a day as needed - to fill Mar 06, 2009    Effexor Xr 150 Mg Xr24h-cap (Venlafaxine hcl) .Marland Kitchen... 1po once daily stable overall by hx and exam, ok to continue meds/tx as is   Complete Medication List: 1)  Gabapentin 400 Mg Caps (Gabapentin) .Marland Kitchen.. 1 by mouth three times a day 2)  Alprazolam 0.5 Mg Tabs (Alprazolam) .Marland Kitchen.. 1 by mouth two times a day as needed - to fill Mar 06, 2009 3)  Percocet 10-325 Mg Tabs (Oxycodone-acetaminophen) .... 1/2 - 1 by mouth qid as needed pain - to fill Aug 30, 2009 4)  Robaxin-750 750 Mg Tabs (Methocarbamol) .Marland Kitchen.. 1 by mouth two times a day as needed 5)  Omeprazole 20 Mg Cpdr (Omeprazole) .... 2 by mouth once daily 6)  Trimethobenzamide Hcl 300 Mg Caps (Trimethobenzamide hcl) .Marland Kitchen.. 1 by mouth four times per day as needed 7)  Effexor Xr 150 Mg Xr24h-cap (Venlafaxine hcl) .Marland Kitchen.. 1po once daily 8)  Zolpidem Tartrate 10 Mg Tabs (Zolpidem tartrate) .Marland Kitchen.. 1po at bedtime as needed  Patient Instructions: 1)  Please take all new medications as prescribed - the sleep medicine, and the pain med refill 2)  Continue all previous medications as before this visit  3)  please keep your appt with Dr Jeral Fruit on Apr 28 as planned 4)  Please schedule a follow-up appointment Jan 2012 for Yearly physical , or sooner if  needed Prescriptions: TRIMETHOBENZAMIDE HCL 300 MG CAPS (TRIMETHOBENZAMIDE HCL) 1 by mouth four times per day as needed  #40 x 3   Entered and Authorized by:   Corwin Levins MD   Signed by:   Corwin Levins MD on 08/30/2009   Method used:   Print then Give to Patient   RxID:   [id number] ZOLPIDEM TARTRATE 10 MG TABS (ZOLPIDEM TARTRATE) 1po at bedtime as needed  #30 x 3   Entered and Authorized by:   Corwin Levins MD   Signed by:   Corwin Levins MD on 08/30/2009   Method used:   Print then Give to Patient   RxID:   [id number] PERCOCET 10-325 MG  TABS (OXYCODONE-ACETAMINOPHEN) 1/2 - 1 by mouth qid as needed pain - to fill Aug 30, 2009  #120 x 0   Entered and Authorized by:   Corwin Levins MD   Signed by:   Corwin Levins MD on 08/30/2009   Method used:   Print then Give to Patient   RxID:   [id number]

## 2010-06-27 NOTE — Procedures (Signed)
Summary: Colonoscopy  Patient: Michelle [name] Note: All result statuses are Final unless otherwise noted.  Tests: (1) Colonoscopy (COL)   COL Colonoscopy           DONE     [facility]     [address]     [place], [place]  [address]           COLONOSCOPY PROCEDURE REPORT           PATIENT:  Michelle, [name]  MR#:  [id number]     BIRTHDATE:  Jun 25, 1960, 48 yrs. old  GENDER:  female           ENDOSCOPIST:  Wilhemina Bonito. Eda Keys, MD     Referred by:  Oliver Barre, M.D.           PROCEDURE DATE:  08/04/2009     PROCEDURE:  Colonoscopy, Diagnostic     ASA CLASS:  Class II     INDICATIONS:  anemia ; normocytic w/ low iron sat and low B12           MEDICATIONS:   Fentanyl 75 mcg IV, Versed 8 mg IV, Benadryl 37.5     mg IV           DESCRIPTION OF PROCEDURE:   After the risks benefits and     alternatives of the procedure were thoroughly explained, informed     consent was obtained.  Digital rectal exam was performed and     revealed no abnormalities.   The LB [id number] endoscope     was introduced through the anus and advanced to the cecum, which     was identified by both the appendix and ileocecal valve, limited     by fair prep. Time to cecum = 9:10 min.   The quality of the prep     was Moviprep fair.  The instrument was then slowly withdrawn (time     = 8:47 min) as the colon was fully examined.     <<PROCEDUREIMAGES>>           FINDINGS:  A normal appearing cecum, ileocecal valve, and     appendiceal orifice were identified. The ascending, hepatic     flexure, transverse, splenic flexure, descending, sigmoid colon,     and rectum appeared unremarkable.  No polyps or cancers were seen.     A fair prep limited this exam for lesions < 1cm.   Retroflexed     views in the rectum revealed no abnormalities.    The scope was     then withdrawn from the patient and the procedure completed.           COMPLICATIONS:  None           ENDOSCOPIC IMPRESSION:     1) Normal  colon     2) No polyps or cancers     3) Fair prep           RECOMMENDATIONS:     1) Follow up colonoscopy in 5 years for screening (sooner than     guidelines due to prep limitations)     2) EGD today           ______________________________     Wilhemina Bonito. Eda Keys, MD           CC:  Corwin Levins, MD;The Patient           n.     Rosalie DoctorWilhemina Bonito. Marina Goodell  Jr at 08/04/2009 02:13 PM           Dara Lords, [phone]  Note: An exclamation mark (!) indicates a result that was not dispersed into the flowsheet. Document Creation Date: 08/04/2009 2:13 PM _______________________________________________________________________  (1) Order result status: Final Collection or observation date-time: 08/04/2009 14:02 Requested date-time:  Receipt date-time:  Reported date-time:  Referring Physician:   Ordering Physician: Fransico Setters [phone]) Specimen Source:  Source: Launa Grill Order Number: [id number] Lab site:   Appended Document: Colonoscopy    Clinical Lists Changes  Observations: Added new observation of COLONNXTDUE: 07/2014 (08/04/2009 15:15)

## 2010-06-29 NOTE — Progress Notes (Signed)
Summary: Percocet  Phone Note Call from Patient   Caller: Patient [phone] Summary of Call: Pt called requesting refill of Percocet Initial call taken by: Margaret Pyle, CMA,  May 31, 2010 9:01 AM    New/Updated Medications: PERCOCET 10-325 MG  TABS (OXYCODONE-ACETAMINOPHEN) 1/2 - 1 by mouth qid as needed pain - to fill jan4, 2012 Prescriptions: PERCOCET 10-325 MG  TABS (OXYCODONE-ACETAMINOPHEN) 1/2 - 1 by mouth qid as needed pain - to fill jan4, 2012  #120 x 0   Entered and Authorized by:   Corwin Levins MD   Signed by:   Corwin Levins MD on 05/31/2010   Method used:   Print then Give to Patient   RxID:   [id number]  done hardcopy to LIM side B - dahlia Corwin Levins MD  May 31, 2010 10:06 AM   Pt informed, Rx in cabinet for pt pick up Margaret Pyle, CMA  May 31, 2010 10:12 AM
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
## 2010-07-28 ENCOUNTER — Telehealth: Payer: Self-pay | Admitting: Internal Medicine

## 2010-08-01 ENCOUNTER — Other Ambulatory Visit: Payer: Self-pay

## 2010-08-03 NOTE — Progress Notes (Signed)
Summary: Percocet  Phone Note Call from Patient   Caller: Patient [phone] Summary of Call: Pt called requesting refill of Percocet Initial call taken by: Margaret Pyle, CMA,  July 28, 2010 9:15 AM  Follow-up for Phone Call        done hardcopy to LIM side B - dahlia  Follow-up by: Corwin Levins MD,  July 28, 2010 12:54 PM  Additional Follow-up for Phone Call Additional follow up Details #1::        pt informed. rx placed upfront in cabinet, ready for pickup. Additional Follow-up by: Brenton Grills CMA Duncan Dull),  July 28, 2010 1:16 PM    New/Updated Medications: PERCOCET 10-325 MG  TABS (OXYCODONE-ACETAMINOPHEN) 1/2 - 1 by mouth qid as needed pain - to fill Jul 28, 2010 Prescriptions: PERCOCET 10-325 MG  TABS (OXYCODONE-ACETAMINOPHEN) 1/2 - 1 by mouth qid as needed pain - to fill Jul 28, 2010  #120 x 0   Entered and Authorized by:   Corwin Levins MD   Signed by:   Corwin Levins MD on 07/28/2010   Method used:   Print then Give to Patient   RxID:   [id number]

## 2010-08-27 ENCOUNTER — Emergency Department (HOSPITAL_COMMUNITY)
Admission: EM | Admit: 2010-08-27 | Discharge: 2010-08-27 | Disposition: A | Discharge status: Home / Self Care | Payer: PRIVATE HEALTH INSURANCE | Attending: Emergency Medicine | Admitting: Emergency Medicine

## 2010-08-27 DIAGNOSIS — R000 Tachycardia, unspecified: Secondary | ICD-10-CM | POA: Insufficient documentation

## 2010-08-27 DIAGNOSIS — F411 Generalized anxiety disorder: Secondary | ICD-10-CM | POA: Insufficient documentation

## 2010-08-27 DIAGNOSIS — I10 Essential (primary) hypertension: Secondary | ICD-10-CM | POA: Insufficient documentation

## 2010-08-27 DIAGNOSIS — R42 Dizziness and giddiness: Secondary | ICD-10-CM | POA: Insufficient documentation

## 2010-08-27 DIAGNOSIS — R5383 Other fatigue: Secondary | ICD-10-CM | POA: Insufficient documentation

## 2010-08-27 DIAGNOSIS — R5381 Other malaise: Secondary | ICD-10-CM | POA: Insufficient documentation

## 2010-08-27 DIAGNOSIS — M79609 Pain in unspecified limb: Secondary | ICD-10-CM | POA: Insufficient documentation

## 2010-08-27 LAB — COMPREHENSIVE METABOLIC PANEL
ALT: 14 U/L (ref 0–35)
Alkaline Phosphatase: 86 U/L (ref 39–117)
BUN: 10 mg/dL (ref 6–23)
CO2: 23 mEq/L (ref 19–32)
GFR calc non Af Amer: >60 mL/min (ref >60)
Glucose, Bld: 91 mg/dL (ref 70–99)
Potassium: 3.6 mEq/L (ref 3.5–5.1)
Sodium: 145 mEq/L (ref 135–145)
Total Bilirubin: 0.4 mg/dL (ref 0.3–1.2)

## 2010-08-27 LAB — POCT CARDIAC MARKERS
CKMB, poc: <1 ng/mL — ABNORMAL LOW (ref 1.0–8.0)
Myoglobin, poc: 25.2 ng/mL (ref 12–200)
Troponin i, poc: <0.05 ng/mL (ref 0.00–0.09)

## 2010-08-27 LAB — DIFFERENTIAL
Basophils Relative: 0 % (ref 0–1)
Eosinophils Absolute: 0.1 10*3/uL (ref 0.0–0.7)
Eosinophils Relative: 1 % (ref 0–5)
Lymphs Abs: 2.3 10*3/uL (ref 0.7–4.0)
Monocytes Relative: 5 % (ref 3–12)
Neutrophils Relative %: 71 % (ref 43–77)

## 2010-08-27 LAB — CBC
MCH: 32.5 pg (ref 26.0–34.0)
MCV: 102.1 fL — ABNORMAL HIGH (ref 78.0–100.0)
Platelets: 338 10*3/uL (ref 150–400)
RDW: 13.1 % (ref 11.5–15.5)
WBC: 9.7 10*3/uL (ref 4.0–10.5)

## 2010-08-28 ENCOUNTER — Other Ambulatory Visit: Payer: Self-pay

## 2010-08-28 MED ORDER — OXYCODONE-ACETAMINOPHEN 10-325 MG PO TABS: ORAL_TABLET | ORAL | Status: DC

## 2010-08-28 NOTE — Telephone Encounter (Signed)
Done hardcopy to dahlia/LIM B  

## 2010-08-28 NOTE — Telephone Encounter (Signed)
Pt called requesting refill of Percocet 10-325 last filled 07/28/2010

## 2010-08-28 NOTE — Telephone Encounter (Signed)
Pt advised via VM, rx in cabinet for pt pick up

## 2010-08-30 ENCOUNTER — Ambulatory Visit (INDEPENDENT_AMBULATORY_CARE_PROVIDER_SITE_OTHER): Payer: PRIVATE HEALTH INSURANCE | Admitting: Internal Medicine

## 2010-08-30 ENCOUNTER — Encounter: Payer: Self-pay | Admitting: Internal Medicine

## 2010-08-30 VITALS — BP 148/80 | HR 108 | Temp 98.3°F | Ht 62.0 in | Wt 124.5 lb

## 2010-08-30 DIAGNOSIS — I10 Essential (primary) hypertension: Secondary | ICD-10-CM | POA: Insufficient documentation

## 2010-08-30 DIAGNOSIS — H1030 Unspecified acute conjunctivitis, unspecified eye: Secondary | ICD-10-CM | POA: Insufficient documentation

## 2010-08-30 DIAGNOSIS — F411 Generalized anxiety disorder: Secondary | ICD-10-CM

## 2010-08-30 DIAGNOSIS — E785 Hyperlipidemia, unspecified: Secondary | ICD-10-CM

## 2010-08-30 MED ORDER — METHOCARBAMOL 750 MG PO TABS: 750.0000 mg | ORAL_TABLET | Freq: Two times a day (BID) | ORAL | Status: DC | PRN

## 2010-08-30 MED ORDER — TOBRAMYCIN 0.3 % OP SOLN: 1.0000 [drp] | Freq: Four times a day (QID) | OPHTHALMIC | Status: AC

## 2010-08-30 MED ORDER — LOSARTAN POTASSIUM 100 MG PO TABS: 100.0000 mg | ORAL_TABLET | Freq: Every day | ORAL | Status: DC

## 2010-08-30 NOTE — Assessment & Plan Note (Addendum)
Pt wiling to start the lipitor after d/w pt today;  To start and f/u next visit with labs,  to f/u any worsening symptoms or concerns  Lab Results  Component Value Date   LDLCALC 99 06/14/2009

## 2010-08-30 NOTE — Progress Notes (Signed)
Subjective:    Patient ID: Michelle [name], female    DOB: 1960/12/06, 50 y.o.   MRN: [id number]  HPI  Here to f/u  - c/o elev BP recently, seen in the ER last Sunday, with weakness, fatigue, elev BP, just "didnt feel like myself", BP at work on Saturday 198/118 per pt, worked the rest of the day, but felt "strange", hard to concentrate, but no HA, and Pt denies chest pain, increased sob or doe, wheezing, orthopnea, PND, increased LE swelling, palpitations, dizziness or syncope. On Sunday AM ( 4 days ago) had onset nausea, left arm achy and tingling, nervous so husband took her to ER.  Had run out of the Effexor for about 2 days but she was able to get more soon after.  In ER ECG felt ok, and cbc, cmet, cpk, normal.  Tx with IVF's and BP came down somewhat, no other meds to go home, and here to f/u BP.  Has been taking the effexor about 2 yrs, and has had elev BP off and on since 50yo. Was tx at one point with BP med, but had to stop after BP went too low.  BP last here in jan 2012 similar to today - mild elevated.  Pt wants to stay on effexor if possible - that and paxil have been working well for her.  Has been trying to follow lower chol diet - but will start taking the lipitor.  Has also had incidental left eyelid ptosis for approx one wk. With swelling but not painful, and waters occasionally.   Denies worsening depressive symptoms, suicidal ideation, or panic, though has ongoing anxiety  Past Medical History  Diagnosis Date  . VITAMIN B12 DEFICIENCY 07/26/2009  . HYPERLIPIDEMIA 02/06/2007  . ANEMIA-IRON DEFICIENCY 06/21/2009  . ANEMIA-B12 DEFICIENCY 07/11/2009  . ANXIETY 02/06/2007  . DEPRESSION 02/06/2007  . MIGRAINE HEADACHE 02/06/2007  . OTITIS EXTERNA, RIGHT 10/04/2009  . EAR PAIN, RIGHT 11/29/2009  . Glossitis 10/23/2007  . GERD 07/11/2009  . DYSPEPSIA 10/23/2007  . OSTEOARTHRITIS 02/06/2007  . 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 DISC DISEASE, CERVICAL 05/03/2009  . LOW BACK PAIN  02/06/2007  . BACK PAIN 07/01/2007  . LATERAL EPICONDYLITIS, LEFT 06/03/2008  . Insomnia, unspecified 02/06/2007  . FEVER UNSPECIFIED 06/03/2008  . CHEST PAIN 06/21/2009  . FREQUENCY, URINARY 03/12/2007  . ABDOMINAL PAIN RIGHT LOWER QUADRANT 07/26/2009  . Abdominal pain, left lower quadrant 07/26/2009  . ABDOMINAL PAIN, LOWER 11/29/2009  . ELEVATED BLOOD PRESSURE WITHOUT DIAGNOSIS OF HYPERTENSION 07/01/2007  . HEAD TRAUMA, CLOSED 11/29/2009  . ELBOW PAIN, RIGHT 06/27/2010   Past Surgical History  Procedure Date  . Right ankle surgury   . Abdominal hysterectomy 2004  . S/p c-spine fusion c 5-6   . Appendectomy     reports that she has been smoking.  She does not have any smokeless tobacco history on file. She reports that she drinks alcohol. She reports that she does not use illicit drugs. family history includes Coronary artery disease (age of onset:50) in her other; Diabetes in her other; and Hypertension in her other. Allergies  Allergen Reactions  . Citalopram     REACTION: "feels wierd"  . Diclofenac Sodium     REACTION: GI Upset  . Fluoxetine Hcl     REACTION: nausea  . Ibuprofen   . Venlafaxine     REACTION: nausea    Current Outpatient Prescriptions on File Prior to Visit  Medication Sig Dispense Refill  . atorvastatin (LIPITOR) 10  MG tablet Take 10 mg by mouth daily.        . diclofenac sodium (VOLTAREN) 1 % GEL Apply topically. Use as directed two times a day as needed for pain       . gabapentin (NEURONTIN) 400 MG tablet Take 400 mg by mouth 3 (three) times daily.        . methocarbamol (ROBAXIN) 750 MG tablet Take 750 mg by mouth 2 (two) times daily as needed.        Marland Kitchen omeprazole (PRILOSEC) 20 MG capsule Take 20 mg by mouth 2 (two) times daily.        Marland Kitchen oxyCODONE-acetaminophen (PERCOCET) 10-325 MG per tablet 1/2 -1 tablet by mouth every 6 hours as needed for pain  120 tablet  0  . PARoxetine (PAXIL) 10 MG tablet Take 10 mg by mouth daily.        Marland Kitchen trimethobenzamide (TIGAN) 300 MG  capsule Take 300 mg by mouth 4 (four) times daily as needed.        . venlafaxine (EFFEXOR-XR) 150 MG 24 hr capsule Take 150 mg by mouth daily.        Marland Kitchen zolpidem (AMBIEN) 10 MG tablet Take 10 mg by mouth at bedtime as needed.            Review of Systems Review of Systems  Constitutional: Negative for diaphoresis and unexpected weight change.  HENT: Negative for drooling and tinnitus.   Eyes: Negative for photophobia and visual disturbance.  Respiratory: Negative for choking and stridor.   Gastrointestinal: Negative for vomiting and blood in stool.  Genitourinary: Negative for hematuria and decreased urine volume.  Musculoskeletal: Negative for gait problem.  Skin: Negative for color change and wound.  Neurological: Negative for tremors and numbness.  Psychiatric/Behavioral: Negative for decreased concentration. The patient is not hyperactive.       Objective:   Physical ExamBP 148/80  Pulse 108  Temp(Src) 98.3 F (36.8 C) (Oral)  Ht 5\' 2"  (1.575 m)  Wt 124 lb 8 oz (56.473 kg)  BMI 22.77 kg/m2  SpO2 96% Physical Exam  VS noted Constitutional: Pt appears well-developed and well-nourished.  HENT: Head: Normocephalic.  Right Ear: External ear normal.  Left Ear: External ear normal.  Eyes: Conjunctivae and EOM are normal except for conjunt injection on the left and upper/lower lids puffy swollen, mild tender, mild erythema  . Pupils are equal, round, and reactive to light.  Neck: Normal range of motion. Neck supple.  Cardiovascular: Normal rate and regular rhythm.   Pulmonary/Chest: Effort normal and breath sounds normal.  Abd:  Soft, NT, non-distended, + BS Neurological: Pt is alert. No cranial nerve deficit.  Skin: Skin is warm. No erythema.  Psychiatric: Pt behavior is normal. Thought content normal.          Assessment & Plan:

## 2010-08-30 NOTE — Patient Instructions (Signed)
Take all new medications as prescribed - the BP med, cholesterol med, and the antibiotic for the eye If the eye does not improve, please consider seeing your opthomologist (or call here for referral if needed) If still present or worse next visit, we may need to consider neurology consultation Please return in 1 mo with Lab testing done 3-5 days before

## 2010-08-30 NOTE — Assessment & Plan Note (Addendum)
With left ptosis I think due to swelling, for tobramycin opth asd, and work note given,  to f/u any worsening symptoms or concerns, consider further eval such as optho exam or MRI if not improved

## 2010-08-31 ENCOUNTER — Encounter: Payer: Self-pay | Admitting: Internal Medicine

## 2010-08-31 NOTE — Assessment & Plan Note (Signed)
stable overall by hx and exam, most recent lab reviewed with pt, and pt to continue medical treatment as before   - for med refills

## 2010-08-31 NOTE — Assessment & Plan Note (Signed)
stable overall by hx and exam, most recent lab reviewed with pt, and pt to continue medical treatment as before  Lab Results  Component Value Date   WBC 9.7 08/27/2010   HGB 12.4 08/27/2010   HCT 39.0 08/27/2010   PLT 338 08/27/2010   CHOL 273* 06/27/2010   TRIG 95.0 06/27/2010   HDL 66.50 06/27/2010   LDLDIRECT 189.4 06/27/2010   ALT 14 08/27/2010   AST 20 08/27/2010   NA 145 08/27/2010   K 3.6 08/27/2010   CL 112 08/27/2010   CREATININE 0.43 08/27/2010   BUN 10 08/27/2010   CO2 23 08/27/2010   TSH 1.25 06/27/2010

## 2010-09-12 ENCOUNTER — Other Ambulatory Visit: Payer: Self-pay | Admitting: Internal Medicine

## 2010-09-12 LAB — BASIC METABOLIC PANEL
BUN: 12 mg/dL (ref 6–23)
CO2: 26 mEq/L (ref 19–32)
Calcium: 9.1 mg/dL (ref 8.4–10.5)
Creatinine, Ser: 0.59 mg/dL (ref 0.4–1.2)
GFR calc non Af Amer: >60 mL/min (ref >60)
Glucose, Bld: 79 mg/dL (ref 70–99)

## 2010-09-12 LAB — DIFFERENTIAL
Basophils Absolute: 0.1 10*3/uL (ref 0.0–0.1)
Basophils Relative: 1 % (ref 0–1)
Lymphocytes Relative: 41 % (ref 12–46)
Monocytes Absolute: 0.5 10*3/uL (ref 0.1–1.0)
Neutro Abs: 4.2 10*3/uL (ref 1.7–7.7)
Neutrophils Relative %: 50 % (ref 43–77)

## 2010-09-12 LAB — CBC
Hemoglobin: 10.3 g/dL — ABNORMAL LOW (ref 12.0–15.0)
MCHC: 32.5 g/dL (ref 30.0–36.0)
Platelets: 514 10*3/uL — ABNORMAL HIGH (ref 150–400)
RDW: 17.1 % — ABNORMAL HIGH (ref 11.5–15.5)

## 2010-09-22 ENCOUNTER — Other Ambulatory Visit: Payer: Self-pay | Admitting: Internal Medicine

## 2010-09-26 ENCOUNTER — Other Ambulatory Visit: Payer: Self-pay

## 2010-09-26 MED ORDER — OXYCODONE-ACETAMINOPHEN 10-325 MG PO TABS: ORAL_TABLET | ORAL | Status: DC

## 2010-09-26 NOTE — Telephone Encounter (Signed)
Pt informed, Rx in cabinet for pt pick up  

## 2010-10-02 ENCOUNTER — Other Ambulatory Visit: Payer: Self-pay | Admitting: Internal Medicine

## 2010-10-03 ENCOUNTER — Ambulatory Visit: Payer: PRIVATE HEALTH INSURANCE | Admitting: Internal Medicine

## 2010-10-03 DIAGNOSIS — Z0289 Encounter for other administrative examinations: Secondary | ICD-10-CM
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
## 2010-10-10 NOTE — H&P (Signed)
[name], [name]              ACCOUNT NO.:  [id number]   MEDICAL RECORD NO.:  [id number]          PATIENT TYPE:  AMB   LOCATION:  SDS                          FACILITY:  [facility]   PHYSICIAN:  Hilda Lias, M.D.   DATE OF BIRTH:  1961-04-09   DATE OF ADMISSION:  10/22/2006  DATE OF DISCHARGE:                              HISTORY & PHYSICAL   HISTORY OF PRESENT ILLNESS:  Michelle [name] is a lady who was seen by me  about 6 weeks ago with neck pain with radiation to both shoulders and  pain going to the posterior interscapular plate and the pain is not any  better.  At this point, it had been going on for more than a year and  things were getting worse.  She had an MRI and she was sent to [place] for  evaluation.   PAST MEDICAL HISTORY:  Negative.   ALLERGIES:  She is not allergic to any medication.   SOCIAL HISTORY:  She does not drink.  She smokes half a pack of  cigarettes a day.   FAMILY HISTORY:  Unremarkable.   REVIEW OF SYSTEMS:  Positive for neck pain and pain to the shoulder.   PHYSICAL EXAMINATION:  HEENT:  Normal.  NECK:  She has decreased flexibility of the cervical spine secondary to  pain.  LUNGS:  Clear.  HEART:  Heart sounds normal.  ABDOMEN:  Normal.  EXTREMITIES:  Normal pulses.  NEUROLOGIC:  She has weakness of both biceps and both wrist extensors  with a normal deltoid and triceps.  Wrist flexors are normal.   IMAGING STUDY:  Cervical spine x-ray shows degenerative disk disease at  the level of 5-6 with the MRI showing stenosis at the level of 5-6 of 7  mm.Marland Kitchen   CLINICAL IMPRESSION:  Cervical stenosis with bilateral radiculopathy.   RECOMMENDATIONS:  The patient is being admitted for surgery and the  procedure will be anterior cervical diskectomy at the level of 5-6 with  bone graft and plate.  She knows about the risks such as infection, CSF  leak, worsening of the pain, need for further surgery, failure of the  plate, damage to the vocal cord and  hematoma.           ______________________________  Hilda Lias, M.D.    EB/MEDQ  D:  10/22/2006  T:  10/22/2006  Job:  [id number]

## 2010-10-10 NOTE — Op Note (Signed)
Michelle [name], Michelle [name]              ACCOUNT NO.:  [id number]   MEDICAL RECORD NO.:  [id number]          PATIENT TYPE:  AMB   LOCATION:  SDS                          FACILITY:  [facility]   PHYSICIAN:  Hilda Lias, M.D.   DATE OF BIRTH:  10-25-1960   DATE OF PROCEDURE:  DATE OF DISCHARGE:                               OPERATIVE REPORT   PREOPERATIVE DIAGNOSIS:  C5-C6 stenosis with chronic radiculopathy,  canal been 7 mm, history of nicotine addiction.   FINAL DIAGNOSIS:  C5-C6 stenosis with chronic radiculopathy, canal been  7 mm, history of nicotine addiction.   PROCEDURE:  C5-C6 diskectomy, decompression of spinal cord, bilateral  foraminotomy, interbody fusion with allograft plate.  Microscope.     This is a 50 year old lady and smoker who had been complaining of neck  pain with radiation to both shoulders.  X-rays showed that the canal at  level C5-6 is like 7 mm.  Surgery was advised and the risk was explained  in the history and physical.   PROCEDURE:  The patient was taken to the OR and after intubation the  neck was cleaned with DuraPrep.  Transverse incision was made through  the skin, subcutaneous tissue down to cervical spine.  The patient had a  large osteophyte which was removed.  X-rays showed that indeed we were  at the level C5-6.  From then on we opened the anterior ligament and we  used the microscope.  We did a total diskectomy, we brought the  microscope video into the area.  We opened the posterior ligament and  using the 1 and 2-mm Kerrison punch.  We decompressed the spinal cord as  well bilaterally, both nerve roots.  The canal and the foramen was quite  narrow.  Having good decompression, the endplate were drilled and 7-mm  allograft was inserted followed by four screws.  Lateral C spine showed  good position of the graft and the plate.  From then on the area was  irrigated and drained was left in the precervical area and the wound was  closed with Vicryl and  Steri-Strips.           ______________________________  Hilda Lias, M.D.     EB/MEDQ  D:  10/22/2006  T:  10/22/2006  Job:  [id number]

## 2010-10-13 NOTE — Op Note (Signed)
NAME:  Michelle [name], Michelle [name]                        ACCOUNT NO.:  [id number]   MEDICAL RECORD NO.:  [id number]                   PATIENT TYPE:  AMB   LOCATION:  NESC                                 FACILITY:  [facility]   PHYSICIAN:  Excell Seltzer. Annabell Howells, M.D.                 DATE OF BIRTH:  05/30/1960   DATE OF PROCEDURE:  02/17/2002  DATE OF DISCHARGE:                                 OPERATIVE REPORT   PROCEDURE:  Cystoscopy, right retrograde pyelogram with interpretation, left  ureteroscopy with holmium and right ureteroscopy.   PREOPERATIVE DIAGNOSES:  Right distal ureteral stones with bilateral renal  calculi.   POSTOPERATIVE DIAGNOSES:  Interval passage of right ureteral stones with a  large left lower pole renal stone and follicular cystitis.   SURGEON:  Excell Seltzer. Annabell Howells, M.D.   ANESTHESIA:  General.   COMPLICATIONS:  None.   INDICATIONS FOR PROCEDURE:  Michelle [name] is a 50 year old white female with  a history of urolithiasis who was admitted to the hospital last week with  right flank pain. She was found to have four stones in the right distal  ureter measuring 4-10 mm in size. She also was noted to have bilateral renal  calculi on CT scan. It was felt that ureteroscopy was indicated for her  stones.   FINDINGS AND PROCEDURE:  The patient came to the holding room this morning  with persistent pain. She was given oral Levaquin, she to her knowledge had  not passed her stone. She was taken to the operating room where a general  anesthetic was induced. She was placed in lithotomy position, her perineum  and genitalia were prepped with Betadine solution, she was draped in the  usual sterile fashion. Cystoscopy was performed using the 22 [place] scope  and the 12 and 70 degree lenses. Examination revealed a normal urethra, the  bladder wall was smooth with some increased vascularity and evidence of  follicular cystitis type changes in the trigone. The right ureteral orifice  was somewhat  gaping with a few small yellowish crystals at the meatus but no  obvious stone at the meatus. A 6 French short ureteroscope was passed up the  orifice to the mid ureteral level and no stones were seen. A right  retrograde pyelogram was performed which confirmed no obvious stones in the  more proximal ureter or intrarenal collecting system. After completion of  this portion of the procedure, the patient was noted to have a large left  lower pole stone on the fluoroscopy and it was felt that an attempt should  be made to try to disrupt this stone with the holmium since the patient was  under anesthesia. I discussed this with her husband and amended the consent.  An attempt was made to pass the 6 short ureteroscope to the kidney, this was  unsuccessful. I could only get it into the mid proximal ureter. I then  passed the guidewire through the ureteroscope and passed a 12/14 introducer  over the guidewire. This went up easily to the kidney. The cord of the  introducer was removed and the 6 French flexible ureteroscope was passed. I  was able to negotiate the scope into the lower pole where the stone was  located; however, with passage of the laser fiber both the 365 and 200 laser  fibers, I was unable to actually engage the stone. With the 200 fiber, I was  able to take a few shots with minor fragmentation but I was not able to  directly engage the stone despite approximately 30 minutes of attempts and I  could not manipulate the stone into a more favorable location. At this  point, the ureteroscope was backed out along with the introducer sheath.  Inspection of the ureter revealed no significant injury or irritation and it  was felt that stenting was not needed. At this point, the bladder was  drained, the patient was given a B&O suppository, she was taken down from  lithotomy position and her anesthetic was reversed. She was moved to the  recovery room in stable condition. There were no  complications.                                               Excell Seltzer. Annabell Howells, M.D.    JJW/MEDQ  D:  02/17/2002  T:  02/17/2002  Job:  [id number]

## 2010-10-13 NOTE — Op Note (Signed)
[place]. [facility]  Patient:    Michelle, [name] Visit Number: [id number] MRN: [id number]          Service Type: MED Location: [id number] Attending Physician:  Andre Lefort Dictated by:   Sandria Bales. Ezzard Standing, M.D. Proc. Date: 05/03/01 Admit Date:  05/02/2001   CC:         Teena Irani. Arlyce Dice, M.D.   Operative Report  DATE OF BIRTH:  11-16-1960.  PREOPERATIVE DIAGNOSIS:  Appendicitis.  POSTOPERATIVE DIAGNOSES: 1. Early appendicitis. 2. Right ovarian cyst, approximately 3 cm in size.  PROCEDURE:  Laparoscopic appendectomy.  SURGEON:  Sandria Bales. Ezzard Standing, M.D.  ANESTHESIA:  General endotracheal with about 6 cc of 0.25% Marcaine local.  COMPLICATIONS:  None.  INDICATION FOR PROCEDURE:  Michelle [name] is a 50 year old white female who has an elevated white blood count, right lower quadrant suprapubic pain, and a CT scan suggestive of acute appendicitis.  The patient now comes for attempted laparoscopic appendectomy.  DESCRIPTION OF PROCEDURE:  Patient placed in a supine position and given a general endotracheal anesthetic supervised by Maren Beach, M.D.  She had a gram of Cefotetan at the initiation of the procedure and had a Foley catheter in place.  Her abdomen was prepped with Betadine solution and sterilely draped.  An infraumbilical incision was made with sharp dissection and carried down to the abdominal cavity.  A 0 degree scope was passed through a 12 mm Hasson trocar.  This was secured with a 0 Vicryl suture.  Two additional trocars were placed, a 5 mm Ethicon trocar in the right subcostal location and a 10 mm Ethicon trocar in the left lower quadrant.  The appendix was identified and actually did not look all that bad, maybe some early appendicitis but certainly not a purulent appendix like I imagined I would see going along with the CT scan.  I grasped the mesentery of the appendix, divided it with a Harmonic scalpel down to  the base of the appendix, and then divided the appendix with the Endo-GIA stapler with the vascular load, then delivered it into an Endocatch bag and sent it to pathology.  I then examined her for any other source of infection and lower abdominal pain.  Her uterus was mobile and unremarkable.  She had had bilateral tubal ligation as evidenced by examination of her tubes.  She had about a 3 cm cyst of her right ovary, but this cyst appeared benign and did not appear to be leaking.  Her left ovary was unremarkable.  Her right colon was unremarkable. I ran her terminal ileum back about two feet, saw no evidence of either inflammation of her terminal ileum, no evidence of a Meckels diverticulum.  I then examined her sigmoid colon, which also was unremarkable for evidence of inflammation or infection.  Her liver was unremarkable.  Her gallbladder was unremarkable.  Her anterior wall of her stomach was unremarkable.  I feel that she probably did have early appendicitis, though not a real remarkable exam at the time of laparoscopy.  I did take pictures of her pelvic organs.  I then infiltrated each port site with 0.25% Marcaine using a total of about 6 cc.  I closed the umbilical incision with a 0 Vicryl suture.  Each trocar site was visualized for no evidence of bleeding.  I then closed the skin at each site with a 5-0 Monocryl suture, painted each one with tincture of benzoin and Steri-Strips and sterilely  dressed it.  She tolerated the procedure well and was transported to the recovery room in good condition. Dictated by:   Sandria Bales. Ezzard Standing, M.D. Attending Physician:  Andre Lefort DD:  05/03/01 TD:  05/03/01 Job: [id number]

## 2010-10-13 NOTE — H&P (Signed)
[place]. [facility]  Patient:    Michelle [name], Michelle [name] Visit Number: [id number] MRN: [id number]          Service Type: MED Location: 1800 [id number] Attending Physician:  Hanley Seamen Dictated by:   Sandria Bales. Ezzard Standing, M.D. Admit Date:  05/02/2001   CC:         Teena Irani. Arlyce Dice, M.D.   History and Physical  DATE OF BIRTH: 10-Jan-1961  CHIEF COMPLAINT: Appendicitis.  HISTORY OF PRESENT ILLNESS: This is a 50 year old white female, who is a patient at [facility] and Dr. Dara Hoyer, though she has not seen him in a number of years.  She started with vague abdominal pain in her lower abdomen and nausea and vomiting yesterday morning, May 02, 2001. This pain steadily worsened.  It remained in the lower central portion of her abdomen.  She presented to [facility] [employer] Emergency Room last evening.  A CT scan was obtained which showed acute appendicitis.  I was then consulted.  She has had no prior history of peptic ulcer disease, liver disease, pancreatic disease, change in bowel habits.  Her only prior abdominal operation was a tubal ligation about 1995.  She has also had a lithotripsy done, unknown physician, in 1996 for kidney stones.  ALLERGIES: None.  MEDICATIONS: None.  REVIEW OF SYSTEMS: NEUROLOGIC: Apparently she took an overdose of medicines for attempted suicide and depression in 2001.  Has been well since that time. PULMONARY: Smokes cigarettes and knows this is bad for her health.  CARDIAC: She has questionable borderline blood pressure, has complained sometimes of tight chest but nothing specific.  Has never had a cardiac evaluation.  Is not currently on medicines for her blood pressure.  GASTROINTESTINAL: See History of Present Illness.  UROLOGIC: She has a history of nephrolithiasis and, again, has had prior lithotripsy.  Has not had any recent kidney stone problem that she is aware of.  GYN: She has two  children, age 10 and 20.  She had a tubal ligation and her last period was about a week ago.  SOCIAL HISTORY: She is not working at the current time.  Accompanied by her husband in the emergency room.  PHYSICAL EXAMINATION:  VITAL SIGNS: Temperature 97.0 degrees, blood pressure 166/104, pulse 94, respirations 20.  GENERAL:  She is a well-nourished white female, alert and cooperative with physical examination.  HEENT: Unremarkable.  NECK: Supple.  No masses, no thyromegaly.  LUNGS: Clear to auscultation.  HEART: Regular rate and rhythm without murmur or rub.  BREAST: Not examined.  She has not had a mammogram and I strongly encouraged her that when this is all over to get up with Dr. Arlyce Dice and obtain start mammography.  ABDOMEN: Active bowel sounds.  Tenderness in the mid lower abdomen and right lower quadrant with guarding and rebound, though this is all fairly mild actually.  RECTAL: Examination not done.  EXTREMITIES: She has movement of all four extremities.  NEUROLOGIC: Grossly intact.  LABORATORY DATA: Laboratories I have show a hemoglobin of 14, hematocrit 42, WBC 21,000.  Lipase 20.  Sodium 137, potassium 3.5, chloride 103, BUN 10, glucose 137.  IMPRESSION:  1. Acute appendicitis by both physical examination and by CT scan.  2. Smokes cigarettes, and knows this is bad for her health.  3. History of nephrolithiasis, stable at this time.  4. History of borderline hypertension.  5. Prior history of overdose about a year ago.  PLAN:  Discussed with the patient and her husband procedure with appendectomy, talked about probable laparoscopic about 80% of the time and also possibility of open surgery, including risks of bleeding, infection, bowel injury, and open surgery.  Will proceed with appendectomy this morning. Dictated by:   Sandria Bales. Ezzard Standing, M.D. Attending Physician:  Hanley Seamen DD:  05/03/01 TD:  05/03/01 Job: [id number]

## 2010-10-13 NOTE — H&P (Signed)
NAME:  Michelle [name], Michelle [name]                        ACCOUNT NO.:  [id number]   MEDICAL RECORD NO.:  [id number]                   PATIENT TYPE:  INP   LOCATION:  NA                                   FACILITY:  [facility]   PHYSICIAN:  Ilda Mori, M.D.                DATE OF BIRTH:  10-30-1960   DATE OF ADMISSION:  01/25/2004  DATE OF DISCHARGE:                                HISTORY & PHYSICAL   HISTORY OF PRESENT ILLNESS:  This is a 50 year old, gravida 2, para 2,  female who presents with a greater than 1-year history of heavy and  prolonged menstrual periods.  The patient was seen for the first time on  March 31, 2004, where she described bleeding for 2-3 weeks per month for  approximately 1 year.  She also complains of decreased appetite and some GI  symptoms.   PAST MEDICAL HISTORY:  She has no known medical problems.   ALLERGIES:  She has no known allergies.   MEDICATIONS:  She is taking no medications.   PAST SURGICAL HISTORY:  1. Bilateral tubal ligation in 1997.  2. A history of kidney stones, for which she received lithotripsy in June of     2004.   SOCIAL HISTORY:  She smokes half a pack a day.  She denies alcohol or drug  use.  She has been married for 17 years and has 2 children.   REVIEW OF SYSTEMS:  Negative.   PHYSICAL EXAMINATION:  VITAL SIGNS:  She is 5 feet, 2 inches tall, 110  pounds.  Afebrile.  Her blood pressure is 120/80.  Pulse was regular and  normal.  HEENT:  Normal.  NECK:  Supple without thyromegaly.  CHEST:  Clear to auscultation and percussion.  HEART:  Regular sinus rhythm without murmurs or gallops.  BREASTS:  Without masses.  ABDOMEN:  Soft without hepatosplenomegaly.  PELVIC:  Normal external genitalia.  The cervix was clean and normal.  The  uterus was anteverted, and normal size, shape, and contour.   PREOPERATIVE LABORATORY DATA:  A hemoglobin of 13.2.  Complete metabolic  panel was negative for any abnormalities in renal or liver function.   Urine  pregnancy test was negative.   The patient was followed for several months with these problems.  Discussions about using a progesterone-only IUD or other forms of  progesterone-only hormones to modify her periods were discussed with the  patient, and the possibility of doing a D&C and endometrial ablation was  discussed with the patient.  The patient felt that she wanted  to go with the procedure that had a 100% success rate, even though the  recovery time was longer and the risks were higher, and therefore we agreed  to proceed with a transvaginal hysterectomy.   PLAN:  To be admitted on January 25, 2004 for surgery at that time.  Ilda Mori, M.D.    RK/MEDQ  D:  01/24/2004  T:  01/24/2004  Job:  [id number]

## 2010-10-13 NOTE — Op Note (Signed)
NAME:  Michelle [name], Michelle [name]                        ACCOUNT NO.:  [id number]   MEDICAL RECORD NO.:  [id number]                   PATIENT TYPE:  INP   LOCATION:  9118                                 FACILITY:  [facility]   PHYSICIAN:  Ilda Mori, M.D.                DATE OF BIRTH:  04-Nov-1960   DATE OF PROCEDURE:  01/25/2004  DATE OF DISCHARGE:                                 OPERATIVE REPORT   PREOPERATIVE DIAGNOSES:  1.  Dysmenorrhea.  2. Menorrhagia.   POSTOPERATIVE DIAGNOSES:  1.  Dysmenorrhea.  2. Menorrhagia.   PROCEDURE:  Transvaginal hysterectomy.   SURGEON:  Ilda Mori, M.D.   ASSISTANT:  Gerrit Friends. Aldona Bar, M.D.   ANESTHESIA:  General endotracheal.   ESTIMATED BLOOD LOSS:  100 mL.   FINDINGS:  Normal-appearing uterus and pelvis.   INDICATIONS:  This is a 50 year old gravida 2, para 2 who presents with  approximately a year history if increasingly severe and painful menstrual  periods.  The patient is a smoker and therefore hormonal contraceptives were  not indicated.  A Merina IUD was discussed with the patient as well as  endometrial ablation but the patient requested hysterectomy.   PROCEDURE IN DETAIL:  The patient was taken to the operating room and  general anesthesia was induced.  She was then placed in the dorsal lithotomy  position.  The lower abdomen, perineum, and vagina were prepped and draped  in the sterile fashion.  The bladder was catheterized and the cervix was  grasped with a Christella Hartigan tenaculum and the paracervical tissues were  infiltrated with 0.5% Marcaine with 1:200,000 epinephrine.  The cervix was  then circumcised sharply.  The anterior cul-de-sac was entered without  difficulty.  The posterior cul-de-sac was entered without difficulty.  The  uterosacral ligaments were clamped, cut, and ligated with a Haney suture.  The paracervical tissues, the base of the cardinal ligament, the uterine  artery, and broad ligament were all clamps with LigaSure  clamps, cauterized,  and cut.  The uterine ovarian anastomosis was then isolated, cauterized with  the LigaSure clamp and cut so the specimen was removed.  The posterior  vaginal cuff was then closed with a running, interlocking Vicryl 1 suture.  The peritoneum was identified and pursestring closed with a pop-off Vicryl  suture.  Prior to tying this purse string, the cul-de-sac was closed with a  pop-off suture running from the uterosacral to uterosacral.  This  suture was then tied and the pursestring peritoneal stitch was tied.  Following this, the uterosacral pedicles were tied across the midline for  added support.  The anterior vaginal cuff was closed with figure-of-eight  sutures and the procedure was then terminated.  The patient left the  operating room in good condition.  Ilda Mori, M.D.    RK/MEDQ  D:  01/25/2004  T:  01/25/2004  Job:  [id number]

## 2010-10-24 ENCOUNTER — Encounter: Payer: Self-pay | Admitting: Internal Medicine

## 2010-10-24 ENCOUNTER — Ambulatory Visit (INDEPENDENT_AMBULATORY_CARE_PROVIDER_SITE_OTHER): Payer: PRIVATE HEALTH INSURANCE | Admitting: Internal Medicine

## 2010-10-24 VITALS — BP 142/100 | HR 113 | Temp 98.5°F | Ht 62.0 in | Wt 116.1 lb

## 2010-10-24 DIAGNOSIS — Z0000 Encounter for general adult medical examination without abnormal findings: Secondary | ICD-10-CM | POA: Insufficient documentation

## 2010-10-24 DIAGNOSIS — M545 Low back pain: Secondary | ICD-10-CM

## 2010-10-24 DIAGNOSIS — F329 Major depressive disorder, single episode, unspecified: Secondary | ICD-10-CM

## 2010-10-24 DIAGNOSIS — E785 Hyperlipidemia, unspecified: Secondary | ICD-10-CM

## 2010-10-24 DIAGNOSIS — I10 Essential (primary) hypertension: Secondary | ICD-10-CM

## 2010-10-24 MED ORDER — PAROXETINE HCL 10 MG PO TABS: 10.0000 mg | ORAL_TABLET | Freq: Every day | ORAL | Status: DC

## 2010-10-24 MED ORDER — OXYCODONE HCL 5 MG PO TABS: ORAL_TABLET | ORAL | Status: DC

## 2010-10-24 MED ORDER — LISINOPRIL 20 MG PO TABS
20.0000 mg | ORAL_TABLET | Freq: Every day | ORAL | Status: DC
Start: 2010-10-24 — End: 2016-07-17

## 2010-10-24 MED ORDER — VENLAFAXINE HCL ER 150 MG PO CP24: 150.0000 mg | ORAL_CAPSULE | Freq: Every day | ORAL | Status: DC

## 2010-10-24 MED ORDER — METHOCARBAMOL 750 MG PO TABS: 750.0000 mg | ORAL_TABLET | Freq: Two times a day (BID) | ORAL | Status: DC | PRN

## 2010-10-24 MED ORDER — LOVASTATIN 20 MG PO TABS
20.0000 mg | ORAL_TABLET | Freq: Every day | ORAL | Status: DC
Start: 2010-10-24 — End: 2010-11-16

## 2010-10-24 MED ORDER — GABAPENTIN 400 MG PO TABS: 400.0000 mg | ORAL_TABLET | Freq: Three times a day (TID) | ORAL | Status: DC

## 2010-10-24 MED ORDER — PROMETHAZINE HCL 25 MG PO TABS: 25.0000 mg | ORAL_TABLET | Freq: Four times a day (QID) | ORAL | Status: AC | PRN

## 2010-10-24 NOTE — Assessment & Plan Note (Signed)
Chronic stable,  Change percocet to oxycodone prn

## 2010-10-24 NOTE — Assessment & Plan Note (Signed)
stable overall by hx and exam, most recent data reviewed with pt, and pt to continue medical treatment as before  Lab Results  Component Value Date   WBC 9.7 08/27/2010   HGB 12.4 08/27/2010   HCT 39.0 08/27/2010   PLT 338 08/27/2010   CHOL 273* 06/27/2010   TRIG 95.0 06/27/2010   HDL 66.50 06/27/2010   LDLDIRECT 189.4 06/27/2010   ALT 14 08/27/2010   AST 20 08/27/2010   NA 145 08/27/2010   K 3.6 08/27/2010   CL 112 08/27/2010   CREATININE 0.43 08/27/2010   BUN 10 08/27/2010   CO2 23 08/27/2010   TSH 1.25 06/27/2010

## 2010-10-24 NOTE — Assessment & Plan Note (Signed)
Out of med with elev BP due to cost;  Urged pt to change from [facility] to [facility], andh to change BP med to lisinopril 20 qd  BP Readings from Last 3 Encounters:  10/24/10 142/100  08/30/10 148/80  06/27/10 152/94

## 2010-10-24 NOTE — Assessment & Plan Note (Signed)
stable overall by hx and exam, most recent data reviewed with pt, and pt to continue medical treatment as before  Lab Results  Component Value Date   LDLCALC 99 06/14/2009   To change the lipitor to lovastatin 20 due to cost concerns

## 2010-10-24 NOTE — Patient Instructions (Signed)
Take all new medications as prescribed Continue all other medications as before, except stop the lipitor, percocet, tigan, and cozaar Please return in 6 months, or sooner if needed

## 2010-10-24 NOTE — Progress Notes (Signed)
Subjective:    Patient ID: Michelle [name], female    DOB: [birth year], 50 y.o.   MRN: [id number]  HPI  Here recently lost her job , no insurance and requeswts med changes to least expensive., although she wants to stay on the effexor though expensive.   Pt denies chest pain, increased sob or doe, wheezing, orthopnea, PND, increased LE swelling, palpitations, dizziness or syncope. Pt denies new neurological symptoms such as new headache, or facial or extremity weakness or numbness   Pt denies polydipsia, polyuria, or low sugar symptoms such as weakness or confusion improved with po intake.  Pt states overall good compliance with meds, but simply cant afford at this time.  Needs less expensive pain med as well n Pt continues to have recurring LBP without change in severity, bowel or bladder change, fever, wt loss,  worsening LE pain/numbness/weakness, gait change or falls. Denies worsening depressive symptoms, suicidal ideation, or panic, though has ongoing anxiety, not increased recently.    Past Medical History  Diagnosis Date  . VITAMIN B12 DEFICIENCY 07/26/2009  . HYPERLIPIDEMIA 02/06/2007  . ANEMIA-IRON DEFICIENCY 06/21/2009  . ANEMIA-B12 DEFICIENCY 07/11/2009  . ANXIETY 02/06/2007  . DEPRESSION 02/06/2007  . MIGRAINE HEADACHE 02/06/2007  . OTITIS EXTERNA, RIGHT 10/04/2009  . EAR PAIN, RIGHT 11/29/2009  . Glossitis 10/23/2007  . GERD 07/11/2009  . DYSPEPSIA 10/23/2007  . OSTEOARTHRITIS 02/06/2007  . 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 DISC DISEASE, CERVICAL 05/03/2009  . LOW BACK PAIN 02/06/2007  . BACK PAIN 07/01/2007  . LATERAL EPICONDYLITIS, LEFT 06/03/2008  . Insomnia, unspecified 02/06/2007  . FEVER UNSPECIFIED 06/03/2008  . CHEST PAIN 06/21/2009  . FREQUENCY, URINARY 03/12/2007  . ABDOMINAL PAIN RIGHT LOWER QUADRANT 07/26/2009  . Abdominal pain, left lower quadrant 07/26/2009  . ABDOMINAL PAIN, LOWER 11/29/2009  . ELEVATED BLOOD PRESSURE WITHOUT DIAGNOSIS OF HYPERTENSION 07/01/2007  . HEAD  TRAUMA, CLOSED 11/29/2009  . ELBOW PAIN, RIGHT 06/27/2010   Past Surgical History  Procedure Date  . Right ankle surgury   . Abdominal hysterectomy 2004  . S/p c-spine fusion c 5-6   . Appendectomy     reports that she has been smoking.  She does not have any smokeless tobacco history on file. She reports that she drinks alcohol. She reports that she does not use illicit drugs. family history includes Coronary artery disease (age of onset:50) in her other; Diabetes in her other; and Hypertension in her other. Allergies  Allergen Reactions  . Citalopram     REACTION: "feels wierd"  . Diclofenac Sodium     REACTION: GI Upset  . Fluoxetine Hcl     REACTION: nausea  . Ibuprofen   . Venlafaxine     REACTION: nausea   Current Outpatient Prescriptions on File Prior to Visit  Medication Sig Dispense Refill  . atorvastatin (LIPITOR) 10 MG tablet Take 10 mg by mouth daily.        Marland Kitchen gabapentin (NEURONTIN) 400 MG tablet Take 400 mg by mouth 3 (three) times daily.        Marland Kitchen losartan (COZAAR) 100 MG tablet Take 1 tablet (100 mg total) by mouth daily.  30 tablet  11  . oxyCODONE-acetaminophen (PERCOCET) 10-325 MG per tablet 1/2 -1 tablet by mouth every 6 hours as needed for pain  120 tablet  0  . PARoxetine (PAXIL) 10 MG tablet Take 10 mg by mouth daily.        Marland Kitchen trimethobenzamide (TIGAN) 300 MG capsule TAKE ONE CAPSULE BY MOUTH  4 TIMES DAILY AS NEEDED  40 capsule  1  . venlafaxine (EFFEXOR-XR) 150 MG 24 hr capsule Take 150 mg by mouth daily.        Marland Kitchen zolpidem (AMBIEN) 10 MG tablet Take 10 mg by mouth at bedtime as needed.        . methocarbamol (ROBAXIN) 750 MG tablet Take 1 tablet (750 mg total) by mouth 2 (two) times daily as needed.  60 tablet  1  . omeprazole (PRILOSEC) 20 MG capsule Take 20 mg by mouth 2 (two) times daily.        Marland Kitchen DISCONTD: diclofenac sodium (VOLTAREN) 1 % GEL Apply topically. Use as directed two times a day as needed for pain        Review of Systems All otherwise neg per  pt     Objective:   Physical Exam BP 142/100  Pulse 113  Temp(Src) 98.5 F (36.9 C) (Oral)  Ht 5\' 2"  (1.575 m)  Wt 116 lb 2 oz (52.674 kg)  BMI 21.24 kg/m2  SpO2 98% Physical Exam  VS noted Constitutional: Pt appears well-developed and well-nourished.  HENT: Head: Normocephalic.  Right Ear: External ear normal.  Left Ear: External ear normal.  right TM mild erythema, no bulging Sinus nontender.  Pharynx mild erythema Eyes: Conjunctivae and EOM are normal. Pupils are equal, round, and reactive to light.  Neck: Normal range of motion. Neck supple.  Cardiovascular: Normal rate and regular rhythm.   Pulmonary/Chest: Effort normal and breath sounds normal.  Abd:  Soft, NT, non-distended, + BS Neurological: Pt is alert. No cranial nerve deficit.  Skin: Skin is warm. No erythema.  Psychiatric: Pt behavior is normal. Thought content normal. except for depressed affect, 1+ nervous No spine tender . No paraspinal tender, swelling       Assessment & Plan:

## 2010-11-09 ENCOUNTER — Telehealth: Payer: Self-pay | Admitting: *Deleted

## 2010-11-09 MED ORDER — ALPRAZOLAM 1 MG PO TABS: 1.0000 mg | ORAL_TABLET | Freq: Two times a day (BID) | ORAL | Status: DC | PRN

## 2010-11-09 NOTE — Telephone Encounter (Signed)
The best thing is maybe slightly increased xanax for prn use;  I dont  See where I have given her xanax recently;  I think she may have been prescribed by another MD -  ? Best to contact other MD who prescribed the xanax?

## 2010-11-09 NOTE — Telephone Encounter (Signed)
Xanax was stopped jan 2012, the .5 mg bid prn;  About the best I can do is a limited rx for 1 mg bid prn, but I would not cont this for the long term   Done hardcopy to a [employer]

## 2010-11-09 NOTE — Telephone Encounter (Signed)
Michelle [name] from [employer] [facility] called regarding pt-pt's husband passed away yesterday in a car accident and pt is having a hard time with anxiety/panic attacks-pt is requesting a week's supply of medication to help with her anxiety-pt has been taking Alprazolam but is requesting something stronger-please advise

## 2010-11-09 NOTE — Telephone Encounter (Signed)
Pt has been taking an Alprazolam from an old rx and has not been prescribed a recent rx. Would pt need an OV for anxiety attacks for new rx?

## 2010-11-10 NOTE — Telephone Encounter (Signed)
Rx faxed to [facility].  

## 2010-11-16 ENCOUNTER — Other Ambulatory Visit (INDEPENDENT_AMBULATORY_CARE_PROVIDER_SITE_OTHER): Payer: Self-pay

## 2010-11-16 ENCOUNTER — Encounter: Payer: Self-pay | Admitting: Internal Medicine

## 2010-11-16 ENCOUNTER — Ambulatory Visit (INDEPENDENT_AMBULATORY_CARE_PROVIDER_SITE_OTHER): Payer: Self-pay | Admitting: Internal Medicine

## 2010-11-16 VITALS — BP 160/104 | HR 117 | Temp 99.0°F | Ht 62.0 in | Wt 113.4 lb

## 2010-11-16 DIAGNOSIS — R10819 Abdominal tenderness, unspecified site: Secondary | ICD-10-CM

## 2010-11-16 DIAGNOSIS — F329 Major depressive disorder, single episode, unspecified: Secondary | ICD-10-CM

## 2010-11-16 DIAGNOSIS — R112 Nausea with vomiting, unspecified: Secondary | ICD-10-CM

## 2010-11-16 DIAGNOSIS — R509 Fever, unspecified: Secondary | ICD-10-CM

## 2010-11-16 DIAGNOSIS — F411 Generalized anxiety disorder: Secondary | ICD-10-CM

## 2010-11-16 DIAGNOSIS — I10 Essential (primary) hypertension: Secondary | ICD-10-CM

## 2010-11-16 HISTORY — DX: Major depressive disorder, single episode, unspecified: F32.9

## 2010-11-16 LAB — URINALYSIS, ROUTINE W REFLEX MICROSCOPIC
Leukocytes, UA: NEGATIVE
Nitrite: NEGATIVE
Specific Gravity, Urine: >=1.03 (ref 1.000–1.030)
Urobilinogen, UA: 0.2 (ref 0.0–1.0)

## 2010-11-16 MED ORDER — CEPHALEXIN 500 MG PO CAPS: 500.0000 mg | ORAL_CAPSULE | Freq: Four times a day (QID) | ORAL | Status: AC

## 2010-11-16 MED ORDER — AMLODIPINE BESYLATE 5 MG PO TABS: 5.0000 mg | ORAL_TABLET | Freq: Every day | ORAL | Status: DC

## 2010-11-16 MED ORDER — PAROXETINE HCL 20 MG PO TABS: 20.0000 mg | ORAL_TABLET | Freq: Every day | ORAL | Status: DC

## 2010-11-16 MED ORDER — TRIMETHOBENZAMIDE HCL 300 MG PO CAPS: 300.0000 mg | ORAL_CAPSULE | Freq: Three times a day (TID) | ORAL | Status: DC | PRN

## 2010-11-16 MED ORDER — CLONAZEPAM 1 MG PO TABS: ORAL_TABLET | ORAL | Status: DC

## 2010-11-16 NOTE — Patient Instructions (Signed)
Take all new medications as prescribed Continue all other medications as before, except stop the xanax, lovastatin, oxycodone, and ambien as you have Please go to LAB in the Basement for the urine tests to be done today Please call the phone number [phone] (the [employer]Tree System) for results of testing in 2-3 days;  When calling, simply dial the number, and when prompted enter the MRN number above (the Medical Record Number) and the # key, then the message should start. Please check your Blood Pressure regularly;  Your goal is to be less than 140/90 Please continue your application for disability and medicaid Please return in 1 month

## 2010-11-16 NOTE — Progress Notes (Signed)
Subjective:    Patient ID: Michelle [name], female    DOB: 18-Jul-1960, 50 y.o.   MRN: [id number]  HPI  Here to f/u;  Unfortunately lost her job recently, and her husband died in accidentally in scooter accident; he was supporting her fully,  Now she has severe anxiety, and grief with worsening depressive symtpoms as well,  Not sleeping well at night despite 2 ambien, and also note xanax seemed to cause more irritalbe when it wore off quickly. Not currently eating well, having some nausea,vomiting.  Also with mild elev temp today, and BP elevated as well.  Daughter is EMT, can take BP at home but has not recently.  Pt was recently hospn [facility] with elev BP. Pt is currently essentially nonfunctional, disheveled today and dependent on her 2 daughters (one here today) to administer meds, prepare food, and have taken her in. Pt denies chest pain, increased sob or doe, wheezing, orthopnea, PND, increased LE swelling, palpitations, dizziness or syncope. Pt denies new neurological symptoms such as new headache, or facial or extremity weakness or numbness  Pt denies polydipsia, polyuria,  Denies urinary symptoms such as dysuria, frequency, urgency,or hematuria, though does have fever and flank pain today Past Medical History  Diagnosis Date  . VITAMIN B12 DEFICIENCY 07/26/2009  . HYPERLIPIDEMIA 02/06/2007  . ANEMIA-IRON DEFICIENCY 06/21/2009  . ANEMIA-B12 DEFICIENCY 07/11/2009  . ANXIETY 02/06/2007  . DEPRESSION 02/06/2007  . MIGRAINE HEADACHE 02/06/2007  . OTITIS EXTERNA, RIGHT 10/04/2009  . EAR PAIN, RIGHT 11/29/2009  . Glossitis 10/23/2007  . GERD 07/11/2009  . DYSPEPSIA 10/23/2007  . OSTEOARTHRITIS 02/06/2007  . 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 DISC DISEASE, CERVICAL 05/03/2009  . LOW BACK PAIN 02/06/2007  . BACK PAIN 07/01/2007  . LATERAL EPICONDYLITIS, LEFT 06/03/2008  . Insomnia, unspecified 02/06/2007  . FEVER UNSPECIFIED 06/03/2008  . CHEST PAIN 06/21/2009  . FREQUENCY, URINARY 03/12/2007    . ABDOMINAL PAIN RIGHT LOWER QUADRANT 07/26/2009  . Abdominal pain, left lower quadrant 07/26/2009  . ABDOMINAL PAIN, LOWER 11/29/2009  . ELEVATED BLOOD PRESSURE WITHOUT DIAGNOSIS OF HYPERTENSION 07/01/2007  . HEAD TRAUMA, CLOSED 11/29/2009  . ELBOW PAIN, RIGHT 06/27/2010  . Major depression 11/16/2010   Past Surgical History  Procedure Date  . Right ankle surgury   . Abdominal hysterectomy 2004  . S/p c-spine fusion c 5-6   . Appendectomy     reports that she has been smoking.  She does not have any smokeless tobacco history on file. She reports that she drinks alcohol. She reports that she does not use illicit drugs. family history includes Coronary artery disease (age of onset:50) in her other; Diabetes in her other; and Hypertension in her other. Allergies  Allergen Reactions  . Citalopram     REACTION: "feels wierd"  . Diclofenac Sodium     REACTION: GI Upset  . Fluoxetine Hcl     REACTION: nausea  . Ibuprofen   . Venlafaxine     REACTION: nausea   Current Outpatient Prescriptions on File Prior to Visit  Medication Sig Dispense Refill  . gabapentin (NEURONTIN) 400 MG tablet Take 1 tablet (400 mg total) by mouth 3 (three) times daily.  90 tablet  5  . lisinopril (PRINIVIL,ZESTRIL) 20 MG tablet Take 1 tablet (20 mg total) by mouth daily.  90 tablet  3  . methocarbamol (ROBAXIN) 750 MG tablet Take 1 tablet (750 mg total) by mouth 2 (two) times daily as needed.  60 tablet  2  . venlafaxine (  EFFEXOR-XR) 150 MG 24 hr capsule Take 1 capsule (150 mg total) by mouth daily.  90 capsule  3    Review of Systems All otherwise neg per pt     Objective:   Physical Exam BP 160/104  Pulse 117  Temp(Src) 99 F (37.2 C) (Oral)  Ht 5\' 2"  (1.575 m)  Wt 113 lb 6 oz (51.427 kg)  BMI 20.74 kg/m2  SpO2 96% Physical Exam  VS noted, disheveled, poorly groomed, markedly psychomotor retarded, 1-2+ nervous Constitutional: thin, somewhat frail appearing  HENT: Head: Normocephalic.  Right Ear:  External ear normal.  Left Ear: External ear normal.  Bilat tm's without erythema.  Sinus nontender.  Pharynx mild erythema Eyes: Conjunctivae and EOM are normal. Pupils are equal, round, and reactive to light.  Neck: Normal range of motion. Neck supple.  Cardiovascular: Normal rate and regular rhythm.   Pulmonary/Chest: Effort normal and breath sounds normal.  Abd:  Soft,  non-distended, + BS, mild lower abd tender, no guarding or rebound, mild right flank tender noted Neurological: Pt is alert. No cranial nerve deficit.  Skin: Skin is warm. No erythema.  Psychiatric: as above        Assessment & Plan:

## 2010-11-17 LAB — URINE CULTURE
Colony Count: NO GROWTH
Organism ID, Bacteria: NO GROWTH

## 2010-11-18 ENCOUNTER — Encounter: Payer: Self-pay | Admitting: Internal Medicine

## 2010-11-18 NOTE — Assessment & Plan Note (Addendum)
See depression assessment and plan, also add clonopin for sleep and anxiety

## 2010-11-18 NOTE — Assessment & Plan Note (Signed)
For phenergan prn

## 2010-11-18 NOTE — Assessment & Plan Note (Signed)
Verified nonsuicidal, to increase the paxil which may help with appetitie as well;  Declines counseling or psychiatry referral,  To f/u with [facility] or ER for worsening symtpoms such as suicidal ideation

## 2010-11-18 NOTE — Assessment & Plan Note (Signed)
See fever assessment and plan

## 2010-11-18 NOTE — Assessment & Plan Note (Signed)
Exam bening except for low abd tender and right flank tender - for emprici cephalexin, for UA,  to f/u any worsening symptoms or concerns

## 2010-11-18 NOTE — Assessment & Plan Note (Signed)
?   [facility] vs other - see fever assessment and plan

## 2010-11-18 NOTE — Assessment & Plan Note (Addendum)
uncontroleld, to add amlodipine 5 mg qd   BP Readings from Last 3 Encounters:  11/16/10 160/104  10/24/10 142/100  08/30/10 148/80

## 2010-11-27 ENCOUNTER — Telehealth: Payer: Self-pay | Admitting: *Deleted

## 2010-11-27 MED ORDER — ONDANSETRON HCL 4 MG PO TABS: 4.0000 mg | ORAL_TABLET | Freq: Three times a day (TID) | ORAL | Status: DC | PRN

## 2010-11-27 NOTE — Telephone Encounter (Signed)
Pharmacy is requesting a change in Rx from Trimethobenzamide HC 300 mg Cap [to] Zofran;stating that Rx is more than Pt can afford right now.?

## 2010-11-27 NOTE — Telephone Encounter (Signed)
Done per emr 

## 2010-11-28 ENCOUNTER — Other Ambulatory Visit: Payer: Self-pay

## 2010-11-28 MED ORDER — OXYCODONE-ACETAMINOPHEN 10-325 MG PO TABS: ORAL_TABLET | ORAL | Status: DC

## 2010-11-28 NOTE — Telephone Encounter (Signed)
Please ask pt if this really needed, as she had stopped it prior to her last visit

## 2010-11-28 NOTE — Telephone Encounter (Signed)
Pt informed, Rx in cabinet for pt pick up  

## 2010-11-28 NOTE — Telephone Encounter (Signed)
Pt advised that her Headache are only helped with Percocet. Pt says that her headaches can be very severe and although she was trying to go without Percocet due to cost she cannot tolerate the headaches.

## 2010-12-20 ENCOUNTER — Ambulatory Visit: Payer: Self-pay | Admitting: Internal Medicine

## 2011-01-01 ENCOUNTER — Ambulatory Visit: Payer: Self-pay | Admitting: Internal Medicine

## 2011-01-01 ENCOUNTER — Emergency Department (HOSPITAL_COMMUNITY)
Admission: EM | Admit: 2011-01-01 | Discharge: 2011-01-01 | Disposition: A | Discharge status: Home / Self Care | Payer: Self-pay | Attending: Emergency Medicine | Admitting: Emergency Medicine

## 2011-01-01 DIAGNOSIS — F3289 Other specified depressive episodes: Secondary | ICD-10-CM | POA: Insufficient documentation

## 2011-01-01 DIAGNOSIS — E7800 Pure hypercholesterolemia, unspecified: Secondary | ICD-10-CM | POA: Insufficient documentation

## 2011-01-01 DIAGNOSIS — I10 Essential (primary) hypertension: Secondary | ICD-10-CM | POA: Insufficient documentation

## 2011-01-01 DIAGNOSIS — F411 Generalized anxiety disorder: Secondary | ICD-10-CM | POA: Insufficient documentation

## 2011-01-01 DIAGNOSIS — F329 Major depressive disorder, single episode, unspecified: Secondary | ICD-10-CM | POA: Insufficient documentation

## 2011-01-01 LAB — DIFFERENTIAL
Basophils Absolute: 0 10*3/uL (ref 0.0–0.1)
Eosinophils Absolute: 0 10*3/uL (ref 0.0–0.7)
Eosinophils Relative: 0 % (ref 0–5)
Lymphocytes Relative: 19 % (ref 12–46)
Monocytes Absolute: 1 10*3/uL (ref 0.1–1.0)

## 2011-01-01 LAB — CBC
HCT: 42.4 % (ref 36.0–46.0)
MCHC: 33.5 g/dL (ref 30.0–36.0)
Platelets: 347 10*3/uL (ref 150–400)
RDW: 11.8 % (ref 11.5–15.5)
WBC: 12.7 10*3/uL — ABNORMAL HIGH (ref 4.0–10.5)

## 2011-01-01 LAB — POCT I-STAT, CHEM 8
Creatinine, Ser: 0.5 mg/dL (ref 0.50–1.10)
Glucose, Bld: 85 mg/dL (ref 70–99)
HCT: 44 % (ref 36.0–46.0)
Hemoglobin: 15 g/dL (ref 12.0–15.0)
Potassium: 3.5 mEq/L (ref 3.5–5.1)
Sodium: 139 mEq/L (ref 135–145)
TCO2: 23 mmol/L (ref 0–100)

## 2011-01-01 LAB — RAPID URINE DRUG SCREEN, HOSP PERFORMED
Barbiturates: NOT DETECTED
Benzodiazepines: NOT DETECTED
Cocaine: NOT DETECTED
Opiates: NOT DETECTED

## 2011-01-02 ENCOUNTER — Ambulatory Visit (INDEPENDENT_AMBULATORY_CARE_PROVIDER_SITE_OTHER): Payer: Self-pay | Admitting: Internal Medicine

## 2011-01-02 ENCOUNTER — Other Ambulatory Visit (INDEPENDENT_AMBULATORY_CARE_PROVIDER_SITE_OTHER): Payer: Self-pay

## 2011-01-02 ENCOUNTER — Encounter: Payer: Self-pay | Admitting: Internal Medicine

## 2011-01-02 DIAGNOSIS — M6281 Muscle weakness (generalized): Secondary | ICD-10-CM

## 2011-01-02 DIAGNOSIS — N898 Other specified noninflammatory disorders of vagina: Secondary | ICD-10-CM

## 2011-01-02 DIAGNOSIS — F411 Generalized anxiety disorder: Secondary | ICD-10-CM

## 2011-01-02 DIAGNOSIS — D72829 Elevated white blood cell count, unspecified: Secondary | ICD-10-CM

## 2011-01-02 DIAGNOSIS — R29898 Other symptoms and signs involving the musculoskeletal system: Secondary | ICD-10-CM

## 2011-01-02 DIAGNOSIS — R10819 Abdominal tenderness, unspecified site: Secondary | ICD-10-CM

## 2011-01-02 DIAGNOSIS — F329 Major depressive disorder, single episode, unspecified: Secondary | ICD-10-CM

## 2011-01-02 LAB — URINALYSIS, ROUTINE W REFLEX MICROSCOPIC
Leukocytes, UA: NEGATIVE
Nitrite: NEGATIVE
Specific Gravity, Urine: >=1.03 (ref 1.000–1.030)
Urobilinogen, UA: 0.2 (ref 0.0–1.0)
pH: 5 (ref 5.0–8.0)

## 2011-01-02 MED ORDER — METHOCARBAMOL 750 MG PO TABS: 750.0000 mg | ORAL_TABLET | Freq: Two times a day (BID) | ORAL | Status: DC | PRN

## 2011-01-02 MED ORDER — CEPHALEXIN 500 MG PO CAPS: 500.0000 mg | ORAL_CAPSULE | Freq: Four times a day (QID) | ORAL | Status: AC

## 2011-01-02 MED ORDER — CLONAZEPAM 1 MG PO TABS: ORAL_TABLET | ORAL | Status: DC

## 2011-01-02 NOTE — Assessment & Plan Note (Signed)
Mild > 12 on cbc yesterday per echart, ? Due to UTI, exam o/w benign, ok to follow

## 2011-01-02 NOTE — Assessment & Plan Note (Signed)
?   Herpetic, pt requests serology to determine, rather than cont to wait on the cx

## 2011-01-02 NOTE — Assessment & Plan Note (Addendum)
Low mid abd - ? Cystitis   - for empiric cephalexin, and urine studies,  to f/u any worsening symptoms or concerns

## 2011-01-02 NOTE — Patient Instructions (Signed)
Take all new medications as prescribed - the cephalexin Continue all other medications as before Please go to LAB in the Basement for the blood and/or urine tests to be done today Please call the phone number [phone] (the [employer] System) for results of testing in 2-3 days;  When calling, simply dial the number, and when prompted enter the MRN number above (the Medical Record Number) and the # key, then the message should start. You are given the robaxin refill today, and the clonazepam rx is corrected

## 2011-01-02 NOTE — Assessment & Plan Note (Signed)
I suspect a major factor in her symptoms - for clonazepam refill at tid prn

## 2011-01-02 NOTE — Assessment & Plan Note (Signed)
Improved on current med, to f/u any worsening symptoms or concerns

## 2011-01-02 NOTE — Assessment & Plan Note (Addendum)
Subjective, ? Clinical signficance - has generalized weakness on exam, no back pain, consider MRI, consider neurology eval, for now tx other issues as above

## 2011-01-02 NOTE — Progress Notes (Signed)
Subjective:    Patient ID: Michelle [name], female    DOB: 1961/02/23, 50 y.o.   MRN: [id number]  HPI  Here with onset 1 wk symptoms with concerned daughter - Pt c/o persistent bilat Leg pain with numbness and weakness, but without back pain, bowel or bladder change, fever, wt loss,; feet feel on fire and cold to touch per pt.  Daughter states pt seemed to walk "bow-legged" yesterday and is fearful of risk of fall.  Has been out of clonazapem and chronic robaxin for 3 days only, so not clear if related to symptoms, though she did have what was thought to be a panic attack yesterday with driving where the hands seemed to "draw up". Pt denies fever, wt loss, night sweats, loss of appetite, or other constitutional symptoms  No falls.  Denies worsening depressive symptoms, suicidal ideation, though has ongoing anxiety as above.  Was seen in ER yesterday with mild elev WBC, UA not done, and herpes cx done on vaginal lesion - result pendging.   Past Medical History  Diagnosis Date  . VITAMIN B12 DEFICIENCY 07/26/2009  . HYPERLIPIDEMIA 02/06/2007  . ANEMIA-IRON DEFICIENCY 06/21/2009  . ANEMIA-B12 DEFICIENCY 07/11/2009  . ANXIETY 02/06/2007  . DEPRESSION 02/06/2007  . MIGRAINE HEADACHE 02/06/2007  . OTITIS EXTERNA, RIGHT 10/04/2009  . EAR PAIN, RIGHT 11/29/2009  . Glossitis 10/23/2007  . GERD 07/11/2009  . DYSPEPSIA 10/23/2007  . OSTEOARTHRITIS 02/06/2007  . 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 DISC DISEASE, CERVICAL 05/03/2009  . LOW BACK PAIN 02/06/2007  . BACK PAIN 07/01/2007  . LATERAL EPICONDYLITIS, LEFT 06/03/2008  . Insomnia, unspecified 02/06/2007  . FEVER UNSPECIFIED 06/03/2008  . CHEST PAIN 06/21/2009  . FREQUENCY, URINARY 03/12/2007  . ABDOMINAL PAIN RIGHT LOWER QUADRANT 07/26/2009  . Abdominal pain, left lower quadrant 07/26/2009  . ABDOMINAL PAIN, LOWER 11/29/2009  . ELEVATED BLOOD PRESSURE WITHOUT DIAGNOSIS OF HYPERTENSION 07/01/2007  . HEAD TRAUMA, CLOSED 11/29/2009  . ELBOW PAIN, RIGHT 06/27/2010  .  Major depression 11/16/2010   Past Surgical History  Procedure Date  . Right ankle surgury   . Abdominal hysterectomy 2004  . S/p c-spine fusion c 5-6   . Appendectomy     reports that she has been smoking.  She does not have any smokeless tobacco history on file. She reports that she drinks alcohol. She reports that she does not use illicit drugs. family history includes Coronary artery disease (age of onset:50) in her other; Diabetes in her other; and Hypertension in her other. Allergies  Allergen Reactions  . Citalopram     REACTION: "feels wierd"  . Diclofenac Sodium     REACTION: GI Upset  . Fluoxetine Hcl     REACTION: nausea  . Ibuprofen   . Venlafaxine     REACTION: nausea   Current Outpatient Prescriptions on File Prior to Visit  Medication Sig Dispense Refill  . amLODipine (NORVASC) 5 MG tablet Take 1 tablet (5 mg total) by mouth daily.  30 tablet  11  . gabapentin (NEURONTIN) 400 MG tablet Take 1 tablet (400 mg total) by mouth 3 (three) times daily.  90 tablet  5  . lisinopril (PRINIVIL,ZESTRIL) 20 MG tablet Take 1 tablet (20 mg total) by mouth daily.  90 tablet  3  . ondansetron (ZOFRAN) 4 MG tablet Take 1 tablet (4 mg total) by mouth every 8 (eight) hours as needed for nausea.  40 tablet  1  . oxyCODONE-acetaminophen (PERCOCET) 10-325 MG per tablet 1/2 -1 tablet by  mouth every 6 hours as needed for pain  120 tablet  0  . PARoxetine (PAXIL) 20 MG tablet Take 1 tablet (20 mg total) by mouth daily.  30 tablet  11  . venlafaxine (EFFEXOR-XR) 150 MG 24 hr capsule Take 1 capsule (150 mg total) by mouth daily.  90 capsule  3    Review of Systems Review of Systems  Constitutional: Negative for diaphoresis and unexpected weight change.  HENT: Negative for drooling and tinnitus.   Eyes: Negative for photophobia and visual disturbance.  Respiratory: Negative for choking and stridor.   Gastrointestinal: Negative for vomiting and blood in stool.  Genitourinary: Negative for  hematuria and decreased urine volume.       Objective:   Physical Exam BP 142/100  Pulse 94  Temp(Src) 98.2 F (36.8 C) (Oral)  Ht 5\' 2"  (1.575 m)  Wt 103 lb 6.4 oz (46.902 kg)  BMI 18.91 kg/m2  SpO2 95% Physical Exam  VS noted, much less depressed affect, not tearful this visit Constitutional: Pt appears well-developed and well-nourished.  HENT: Head: Normocephalic.  Right Ear: External ear normal.  Left Ear: External ear normal.  Eyes: Conjunctivae and EOM are normal. Pupils are equal, round, and reactive to light.  Neck: Normal range of motion. Neck supple.  Cardiovascular: Normal rate and regular rhythm.   Pulmonary/Chest: Effort normal and breath sounds normal.  Abd:  Soft, NT, non-distended, + BS except for ? Full bladder and mild low mid abd tender Neurological: Pt is alert. No cranial nerve deficit. Has general weakness  - I am unable to appreciate specific weakness of the LE's; and DTR's/sens intact Skin: Skin is warm. No erythema. No LE edema Psychiatric: Pt behavior is normal. Thought content normal. 2-3+ nervous        Assessment & Plan:

## 2011-01-03 ENCOUNTER — Other Ambulatory Visit: Payer: Self-pay | Admitting: Internal Medicine

## 2011-01-03 ENCOUNTER — Encounter: Payer: Self-pay | Admitting: Internal Medicine

## 2011-01-03 DIAGNOSIS — A6000 Herpesviral infection of urogenital system, unspecified: Secondary | ICD-10-CM | POA: Insufficient documentation

## 2011-01-03 LAB — URINE CULTURE
Colony Count: NO GROWTH
Organism ID, Bacteria: NO GROWTH

## 2011-01-03 LAB — HERPES SIMPLEX VIRUS CULTURE: Culture: NOT DETECTED

## 2011-01-03 MED ORDER — VALACYCLOVIR HCL 1 G PO TABS: ORAL_TABLET | ORAL | Status: AC

## 2011-01-19 ENCOUNTER — Other Ambulatory Visit: Payer: Self-pay

## 2011-01-19 MED ORDER — ONDANSETRON HCL 4 MG PO TABS: 4.0000 mg | ORAL_TABLET | Freq: Three times a day (TID) | ORAL | Status: DC | PRN

## 2011-02-06 ENCOUNTER — Other Ambulatory Visit: Payer: Self-pay

## 2011-02-06 MED ORDER — OXYCODONE-ACETAMINOPHEN 10-325 MG PO TABS: ORAL_TABLET | ORAL | Status: DC

## 2011-02-06 NOTE — Telephone Encounter (Signed)
Done hardcopy to dahlia/LIM B  

## 2011-02-06 NOTE — Telephone Encounter (Signed)
Pt informed via VM, Rx in cabinet for pt pick up  

## 2011-02-23 LAB — DIFFERENTIAL
Basophils Absolute: 0
Basophils Relative: 1
Eosinophils Absolute: 0.1
Monocytes Relative: 8
Neutro Abs: 3.2
Neutrophils Relative %: 44

## 2011-02-23 LAB — CBC
MCHC: 31.4
MCV: 84.4
Platelets: 554 — ABNORMAL HIGH
RDW: 17.4 — ABNORMAL HIGH

## 2011-02-23 LAB — POCT I-STAT, CHEM 8
Creatinine, Ser: 0.9
HCT: 38
Hemoglobin: 12.9
Sodium: 144
TCO2: 23

## 2011-02-23 LAB — POCT CARDIAC MARKERS
CKMB, poc: 2
Troponin i, poc: <0.05

## 2011-02-23 LAB — D-DIMER, QUANTITATIVE: D-Dimer, Quant: <0.22

## 2011-02-27 ENCOUNTER — Other Ambulatory Visit: Payer: Self-pay

## 2011-02-27 DIAGNOSIS — F411 Generalized anxiety disorder: Secondary | ICD-10-CM

## 2011-02-27 MED ORDER — CLONAZEPAM 1 MG PO TABS: ORAL_TABLET | ORAL | Status: DC

## 2011-02-27 NOTE — Telephone Encounter (Signed)
Done hardcopy to robin  

## 2011-02-28 NOTE — Telephone Encounter (Signed)
Faxed hardcopy to [facility] pharmacy [phone]

## 2011-03-02 ENCOUNTER — Other Ambulatory Visit: Payer: Self-pay

## 2011-03-02 MED ORDER — ONDANSETRON HCL 4 MG PO TABS: 4.0000 mg | ORAL_TABLET | Freq: Three times a day (TID) | ORAL | Status: DC | PRN

## 2011-03-13 LAB — URINALYSIS, ROUTINE W REFLEX MICROSCOPIC
Bilirubin Urine: NEGATIVE
Glucose, UA: NEGATIVE
Leukocytes, UA: NEGATIVE
Nitrite: NEGATIVE
Specific Gravity, Urine: 1.023
pH: 6

## 2011-03-13 LAB — COMPREHENSIVE METABOLIC PANEL
ALT: 17
AST: 19
Albumin: 3.5
Alkaline Phosphatase: 67
Chloride: 108
GFR calc Af Amer: >60
Potassium: 3.2 — ABNORMAL LOW
Total Bilirubin: 0.4

## 2011-03-13 LAB — CBC
Platelets: 405 — ABNORMAL HIGH
WBC: 11.6 — ABNORMAL HIGH

## 2011-03-13 LAB — URINE MICROSCOPIC-ADD ON

## 2011-03-14 LAB — I-STAT 8, (EC8 V) (CONVERTED LAB)
Acid-base deficit: 3 — ABNORMAL HIGH
BUN: 18
Bicarbonate: 20.5
Chloride: 108
Glucose, Bld: 107 — ABNORMAL HIGH
HCT: 47 — ABNORMAL HIGH
Hemoglobin: 16 — ABNORMAL HIGH
Operator id: 288331
Potassium: 3.8
Sodium: 138
TCO2: 22
pCO2, Ven: 32.7 — ABNORMAL LOW
pH, Ven: 7.407 — ABNORMAL HIGH

## 2011-03-14 LAB — URINALYSIS, ROUTINE W REFLEX MICROSCOPIC
Glucose, UA: NEGATIVE
Leukocytes, UA: NEGATIVE
Nitrite: NEGATIVE
pH: 5.5

## 2011-03-14 LAB — URINE MICROSCOPIC-ADD ON

## 2011-03-14 LAB — CBC
HCT: 43.8
Hemoglobin: 14.6
MCHC: 33.4
RDW: 13.4

## 2011-03-14 LAB — RAPID URINE DRUG SCREEN, HOSP PERFORMED
Amphetamines: NOT DETECTED
Barbiturates: NOT DETECTED
Benzodiazepines: POSITIVE — AB
Cocaine: NOT DETECTED
Opiates: NOT DETECTED
Tetrahydrocannabinol: NOT DETECTED

## 2011-03-14 LAB — POCT I-STAT CREATININE
Creatinine, Ser: 0.7
Operator id: 288331

## 2011-03-14 LAB — DIFFERENTIAL
Basophils Absolute: 0.1
Basophils Relative: 1
Eosinophils Relative: 0
Lymphocytes Relative: 20
Monocytes Absolute: 0.6

## 2011-03-21 ENCOUNTER — Telehealth: Payer: Self-pay | Admitting: *Deleted

## 2011-03-21 MED ORDER — OXYCODONE-ACETAMINOPHEN 10-325 MG PO TABS: ORAL_TABLET | ORAL | Status: DC

## 2011-03-21 NOTE — Telephone Encounter (Signed)
Done hardcopy to robin  

## 2011-03-21 NOTE — Telephone Encounter (Signed)
Patient requesting RF of percocet 

## 2011-03-22 NOTE — Telephone Encounter (Signed)
Called the patient informed prescription requested is in cabnet at front desk and ready for pickup.

## 2011-04-02 ENCOUNTER — Telehealth: Payer: Self-pay

## 2011-04-03 NOTE — Telephone Encounter (Signed)
I cant tell what the refill is for, given the information provided

## 2011-04-05 ENCOUNTER — Telehealth: Payer: Self-pay

## 2011-04-05 NOTE — Telephone Encounter (Signed)
I cant tell what med is needed from this refill request  Ok to close if this is already taken care of, or an error

## 2011-04-06 MED ORDER — GABAPENTIN 400 MG PO CAPS: 400.0000 mg | ORAL_CAPSULE | Freq: Three times a day (TID) | ORAL | Status: DC

## 2011-04-06 NOTE — Telephone Encounter (Signed)
rx done per emr 

## 2011-04-06 NOTE — Telephone Encounter (Signed)
Medication refill request is for Gabapentin 400 mg tid

## 2011-04-30 ENCOUNTER — Other Ambulatory Visit: Payer: Self-pay

## 2011-04-30 DIAGNOSIS — F411 Generalized anxiety disorder: Secondary | ICD-10-CM

## 2011-04-30 MED ORDER — CLONAZEPAM 1 MG PO TABS: ORAL_TABLET | ORAL | Status: DC

## 2011-04-30 MED ORDER — METHOCARBAMOL 750 MG PO TABS: 750.0000 mg | ORAL_TABLET | Freq: Two times a day (BID) | ORAL | Status: DC | PRN

## 2011-04-30 MED ORDER — OXYCODONE-ACETAMINOPHEN 10-325 MG PO TABS: ORAL_TABLET | ORAL | Status: DC

## 2011-04-30 NOTE — Telephone Encounter (Signed)
Done hardcopy to robin  

## 2011-05-01 ENCOUNTER — Other Ambulatory Visit: Payer: Self-pay

## 2011-05-01 NOTE — Telephone Encounter (Signed)
Patient called requesting refill on pain medications. I called [facility] as prescritpions were sent in yesterday 04/30/2011 , they did receive and will contact patient as they are ready for pickup. The oxycodone was faxed in by mistake and will need to be redone as pharmacy shredded . Please advise

## 2011-05-01 NOTE — Telephone Encounter (Signed)
Please be specific, as "pain medications" is plural, and she is on several pain related medications, including percocet, robaxin, neurontin

## 2011-05-01 NOTE — Telephone Encounter (Signed)
Faxed to [facility] pharmacy.  

## 2011-05-02 MED ORDER — OXYCODONE-ACETAMINOPHEN 10-325 MG PO TABS: ORAL_TABLET | ORAL | Status: DC

## 2011-05-02 NOTE — Telephone Encounter (Signed)
Sorry to not be specific. You filled the Klonopin, Robaxin and Oxycodone on 04/30/2011 (all 3 prescriptions were printed on one sheet and I faxed to pharmacy by mistake as oxycodone must be picked up). I called the pharmacy yesterday 05/01/2011 and they shredded the oxycodone as must received hardcopy from patient. Please redo the Oxycodone and will contact the patient to pickup, hope that is more clear.

## 2011-05-02 NOTE — Telephone Encounter (Signed)
Done hardcopy to robin  

## 2011-05-02 NOTE — Telephone Encounter (Signed)
Called the patient left message to call back 

## 2011-05-02 NOTE — Telephone Encounter (Signed)
Patient informed and did pickup medication.

## 2011-06-04 ENCOUNTER — Other Ambulatory Visit: Payer: Self-pay

## 2011-06-04 DIAGNOSIS — F411 Generalized anxiety disorder: Secondary | ICD-10-CM

## 2011-06-04 MED ORDER — CLONAZEPAM 1 MG PO TABS: ORAL_TABLET | ORAL | Status: DC

## 2011-06-04 NOTE — Telephone Encounter (Signed)
Rx faxed to pharmacy  

## 2011-06-04 NOTE — Telephone Encounter (Signed)
Done hardcopy to robin  

## 2011-06-05 ENCOUNTER — Other Ambulatory Visit: Payer: Self-pay

## 2011-06-05 MED ORDER — ONDANSETRON HCL 4 MG PO TABS: 4.0000 mg | ORAL_TABLET | Freq: Three times a day (TID) | ORAL | Status: DC | PRN

## 2011-06-28 ENCOUNTER — Other Ambulatory Visit: Payer: Self-pay

## 2011-06-28 DIAGNOSIS — F411 Generalized anxiety disorder: Secondary | ICD-10-CM

## 2011-06-28 NOTE — Telephone Encounter (Signed)
I think too soon, as last refill on chart was Jan 2013, for total 3 mo

## 2011-06-29 NOTE — Telephone Encounter (Signed)
Pharmacy informed.

## 2011-09-21 ENCOUNTER — Other Ambulatory Visit: Payer: Self-pay

## 2011-09-21 DIAGNOSIS — F411 Generalized anxiety disorder: Secondary | ICD-10-CM

## 2011-09-21 MED ORDER — CLONAZEPAM 1 MG PO TABS: ORAL_TABLET | ORAL | Status: DC

## 2011-09-21 NOTE — Telephone Encounter (Signed)
Done hardcopy to robin  

## 2011-09-21 NOTE — Telephone Encounter (Signed)
Faxed hardcopy to pharmacy. 

## 2011-11-20 ENCOUNTER — Other Ambulatory Visit: Payer: Self-pay

## 2011-11-20 DIAGNOSIS — F329 Major depressive disorder, single episode, unspecified: Secondary | ICD-10-CM

## 2011-11-20 DIAGNOSIS — I10 Essential (primary) hypertension: Secondary | ICD-10-CM

## 2011-11-20 MED ORDER — VENLAFAXINE HCL ER 150 MG PO CP24: 150.0000 mg | ORAL_CAPSULE | Freq: Every day | ORAL | Status: DC

## 2011-11-20 MED ORDER — PAROXETINE HCL 20 MG PO TABS: 20.0000 mg | ORAL_TABLET | Freq: Every day | ORAL | Status: DC

## 2011-11-20 MED ORDER — AMLODIPINE BESYLATE 5 MG PO TABS: 5.0000 mg | ORAL_TABLET | Freq: Every day | ORAL | Status: DC

## 2011-11-20 NOTE — Telephone Encounter (Signed)
Done erx 

## 2011-11-28 ENCOUNTER — Telehealth: Payer: Self-pay

## 2011-11-28 NOTE — Telephone Encounter (Signed)
No answer at number given.

## 2011-11-28 NOTE — Telephone Encounter (Signed)
Molli Hazard is requesting a return call from Dr Jonny Ruiz regarding this pt.

## 2011-12-03 ENCOUNTER — Encounter: Payer: Self-pay | Admitting: Internal Medicine

## 2011-12-03 ENCOUNTER — Telehealth: Payer: Self-pay | Admitting: Internal Medicine

## 2011-12-03 DIAGNOSIS — T424X1A Poisoning by benzodiazepines, accidental (unintentional), initial encounter: Secondary | ICD-10-CM | POA: Insufficient documentation

## 2011-12-03 HISTORY — DX: Poisoning by benzodiazepines, accidental (unintentional), initial encounter: T42.4X1A

## 2011-12-03 NOTE — Telephone Encounter (Signed)
Received call approx 10 am from psychiatrist/[facility] - pt admitted with klonopin OD

## 2012-02-04 ENCOUNTER — Encounter: Payer: Self-pay | Admitting: Internal Medicine

## 2012-02-04 ENCOUNTER — Other Ambulatory Visit (INDEPENDENT_AMBULATORY_CARE_PROVIDER_SITE_OTHER): Payer: Self-pay

## 2012-02-04 ENCOUNTER — Ambulatory Visit (INDEPENDENT_AMBULATORY_CARE_PROVIDER_SITE_OTHER): Payer: Self-pay | Admitting: Internal Medicine

## 2012-02-04 VITALS — BP 140/90 | HR 102 | Temp 98.1°F | Ht 62.0 in | Wt 117.2 lb

## 2012-02-04 DIAGNOSIS — K219 Gastro-esophageal reflux disease without esophagitis: Secondary | ICD-10-CM

## 2012-02-04 DIAGNOSIS — F411 Generalized anxiety disorder: Secondary | ICD-10-CM

## 2012-02-04 DIAGNOSIS — R300 Dysuria: Secondary | ICD-10-CM

## 2012-02-04 DIAGNOSIS — Z23 Encounter for immunization: Secondary | ICD-10-CM

## 2012-02-04 DIAGNOSIS — R21 Rash and other nonspecific skin eruption: Secondary | ICD-10-CM | POA: Insufficient documentation

## 2012-02-04 DIAGNOSIS — I10 Essential (primary) hypertension: Secondary | ICD-10-CM

## 2012-02-04 LAB — URINALYSIS, ROUTINE W REFLEX MICROSCOPIC
Bilirubin Urine: NEGATIVE
Hgb urine dipstick: NEGATIVE
Urine Glucose: NEGATIVE
Urobilinogen, UA: 0.2 (ref 0.0–1.0)

## 2012-02-04 MED ORDER — ONDANSETRON HCL 4 MG PO TABS: 4.0000 mg | ORAL_TABLET | Freq: Three times a day (TID) | ORAL | Status: AC | PRN

## 2012-02-04 MED ORDER — ONDANSETRON HCL 4 MG PO TABS: 4.0000 mg | ORAL_TABLET | Freq: Three times a day (TID) | ORAL | Status: DC | PRN

## 2012-02-04 MED ORDER — ESOMEPRAZOLE MAGNESIUM 40 MG PO CPDR: 40.0000 mg | DELAYED_RELEASE_CAPSULE | Freq: Every day | ORAL | Status: DC

## 2012-02-04 MED ORDER — AMLODIPINE BESYLATE 5 MG PO TABS: 5.0000 mg | ORAL_TABLET | Freq: Every day | ORAL | Status: DC

## 2012-02-04 MED ORDER — CEPHALEXIN 500 MG PO CAPS: 500.0000 mg | ORAL_CAPSULE | Freq: Four times a day (QID) | ORAL | Status: AC

## 2012-02-04 MED ORDER — TRIAMCINOLONE ACETONIDE 0.1 % EX CREA: TOPICAL_CREAM | Freq: Two times a day (BID) | CUTANEOUS | Status: DC

## 2012-02-04 NOTE — Patient Instructions (Addendum)
You had the flu shot today Take all new medications as prescribed - the cream, and the antibiotic, the nexium and the nausea medication Continue all other medications as before, including re-starting the blood pressure medication Please go to LAB in the Basement for the urine tests to be done today You will be contacted by phone if any changes need to be made immediately.  Otherwise, you will receive a letter about your results with an explanation. Please keep your appointments with your specialists as you have planned - psychiatry Please return in 6 months, or sooner if needed

## 2012-02-04 NOTE — Progress Notes (Signed)
Subjective:    Patient ID: Michelle [name], female    DOB: 1960-10-27, 51 y.o.   MRN: [id number]  HPI  Here to f/u;  Has had mild 3-4 days urinary symptoms such as dysuria with strong urine odor, and frequency,  But no urgency,or hematuria.  Has been on rx meds since 51yo, now 56, was stopped by police for driving under influence (not ETOH, klonopin only); states she sometimes forgets how much she takes, and took too many; was in jail, then weaned off the klonopin at [place]/detox.  Has chronic OA pain, c/o uncontrolled anxiety on current med, hydroxyzine.  Had suicidal ideation, none now, sees Psychiatry at the [facility], doesn't like her psychiatrist - "she wont listen to me."   Wants to go back to Effexor , now on 40 mg paxil, not working as well per pt.  Also on sleep med (trazodone 100 mg - 2 qhs).   Has rash with constant "picking" at the arms with stress.    Also with dyspeptic symptoms, asks for refills of the zofran and nexium.  Denies worsening reflux, dysphagia, abd pain, n/v, bowel change or blood. Past Medical History  Diagnosis Date  . VITAMIN B12 DEFICIENCY 07/26/2009  . HYPERLIPIDEMIA 02/06/2007  . ANEMIA-IRON DEFICIENCY 06/21/2009  . ANEMIA-B12 DEFICIENCY 07/11/2009  . ANXIETY 02/06/2007  . DEPRESSION 02/06/2007  . MIGRAINE HEADACHE 02/06/2007  . OTITIS EXTERNA, RIGHT 10/04/2009  . EAR PAIN, RIGHT 11/29/2009  . Glossitis 10/23/2007  . GERD 07/11/2009  . DYSPEPSIA 10/23/2007  . OSTEOARTHRITIS 02/06/2007  . 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 DISC DISEASE, CERVICAL 05/03/2009  . LOW BACK PAIN 02/06/2007  . BACK PAIN 07/01/2007  . LATERAL EPICONDYLITIS, LEFT 06/03/2008  . Insomnia, unspecified 02/06/2007  . FEVER UNSPECIFIED 06/03/2008  . CHEST PAIN 06/21/2009  . FREQUENCY, URINARY 03/12/2007  . ABDOMINAL PAIN RIGHT LOWER QUADRANT 07/26/2009  . Abdominal pain, left lower quadrant 07/26/2009  . ABDOMINAL PAIN, LOWER 11/29/2009  . ELEVATED BLOOD PRESSURE WITHOUT  DIAGNOSIS OF HYPERTENSION 07/01/2007  . HEAD TRAUMA, CLOSED 11/29/2009  . ELBOW PAIN, RIGHT 06/27/2010  . Major depression 11/16/2010  . Genital herpes 01/03/2011  . Benzodiazepine overdose 12/03/2011    July 2013   Past Surgical History  Procedure Date  . Right ankle surgury   . Abdominal hysterectomy 2004  . S/p c-spine fusion c 5-6   . Appendectomy     reports that she has been smoking.  She does not have any smokeless tobacco history on file. She reports that she drinks alcohol. She reports that she does not use illicit drugs. family history includes Coronary artery disease (age of onset:50) in her other; Diabetes in her other; and Hypertension in her other. Allergies  Allergen Reactions  . Citalopram     REACTION: "feels wierd"  . Diclofenac Sodium     REACTION: GI Upset  . Fluoxetine Hcl     REACTION: nausea  . Ibuprofen   . Venlafaxine     REACTION: nausea   Current Outpatient Prescriptions on File Prior to Visit  Medication Sig Dispense Refill  . PARoxetine (PAXIL) 20 MG tablet Take 40 mg by mouth daily.      . traZODone (DESYREL) 100 MG tablet Take 100 mg by mouth 2 (two) times daily as needed.      Marland Kitchen esomeprazole (NEXIUM) 40 MG capsule Take 1 capsule (40 mg total) by mouth daily.  30 capsule  11  . lisinopril (PRINIVIL,ZESTRIL) 20 MG tablet Take 1  tablet (20 mg total) by mouth daily.  90 tablet  3  . valACYclovir (VALTREX) 1000 MG tablet 1000 mg po bid for 5 days prn outbreak  10 tablet  5   Review of Systems  Constitutional: Negative for diaphoresis and unexpected weight change.  HENT: Negative for tinnitus.   Eyes: Negative for photophobia and visual disturbance.  Respiratory: Negative for choking and stridor.   Gastrointestinal: Negative for vomiting and blood in stool.  Genitourinary: Negative for hematuria and decreased urine volume.  Musculoskeletal: Negative for gait problem.  Skin: Negative for color change and wound.  Neurological: Negative for tremors and  numbness.     Objective:   Physical Exam BP 140/90  Pulse 102  Temp 98.1 F (36.7 C) (Oral)  Ht 5\' 2"  (1.575 m)  Wt 117 lb 4 oz (53.184 kg)  BMI 21.45 kg/m2  SpO2 97% Physical Exam  VS noted, mild ill  Constitutional: Pt appears well-developed and well-nourished.  HENT: Head: Normocephalic.  Right Ear: External ear normal.  Left Ear: External ear normal.  Eyes: Conjunctivae and EOM are normal. Pupils are equal, round, and reactive to light.  Neck: Normal range of motion. Neck supple.  Cardiovascular: Normal rate and regular rhythm.   Pulmonary/Chest: Effort normal and breath sounds normal.  Abd:  Soft, non-distended, + BS with mild low mid abd tender, no flank tender/guarding or rebound Neurological: Pt is alert. Not confused  Skin: Skin is warm. No erythema. But has several superficial areas of excoriation/skin loss to arms only in areas she reach with hands Psychiatric: Pt behavior is normal. Thought content normal. but 2+ nervous, somewhat agitated    Assessment & Plan:

## 2012-02-10 ENCOUNTER — Encounter: Payer: Self-pay | Admitting: Internal Medicine

## 2012-02-10 DIAGNOSIS — R300 Dysuria: Secondary | ICD-10-CM | POA: Insufficient documentation

## 2012-02-10 NOTE — Assessment & Plan Note (Signed)
Mild elevated, to res-tart the amldopine 5 mg

## 2012-02-10 NOTE — Assessment & Plan Note (Addendum)
Possible uti by hx and exam, for urine cx, empiric cephalexin asd

## 2012-02-10 NOTE — Assessment & Plan Note (Signed)
Likely due to manual picking at the skin; encouraged gold bond, also gave kenalog 1% cr prn use

## 2012-02-10 NOTE — Assessment & Plan Note (Signed)
To re-start meds as per pt reqeust

## 2012-02-10 NOTE — Assessment & Plan Note (Signed)
I decined to attempt to change her meds today; pt is encouraged to f/u with her psychiatrist, Continue all other medications as before

## 2012-04-04 ENCOUNTER — Telehealth: Payer: Self-pay

## 2012-04-04 MED ORDER — PANTOPRAZOLE SODIUM 40 MG PO TBEC: 40.0000 mg | DELAYED_RELEASE_TABLET | Freq: Every day | ORAL | Status: DC

## 2012-04-04 NOTE — Telephone Encounter (Signed)
[facility] pharmacy fax requesting to change the patients Nexium to Omeprazole or pantoprazole as she has no insurance.  Please advise on change.

## 2012-04-04 NOTE — Telephone Encounter (Signed)
Ok for pantoprozole 40 - to robin to handle

## 2012-04-14 ENCOUNTER — Other Ambulatory Visit: Payer: Self-pay

## 2012-04-14 MED ORDER — TRIAMCINOLONE ACETONIDE 0.1 % EX CREA: TOPICAL_CREAM | Freq: Two times a day (BID) | CUTANEOUS | Status: DC

## 2012-08-07 ENCOUNTER — Ambulatory Visit: Payer: Self-pay | Admitting: Internal Medicine

## 2012-08-07 DIAGNOSIS — Z0289 Encounter for other administrative examinations: Secondary | ICD-10-CM

## 2012-09-04 ENCOUNTER — Other Ambulatory Visit: Payer: Self-pay

## 2012-09-04 MED ORDER — TRIAMCINOLONE ACETONIDE 0.1 % EX CREA: TOPICAL_CREAM | Freq: Two times a day (BID) | CUTANEOUS | Status: AC

## 2013-05-28 DIAGNOSIS — Z9889 Other specified postprocedural states: Secondary | ICD-10-CM

## 2013-05-28 HISTORY — DX: Other specified postprocedural states: Z98.890

## 2013-11-15 DIAGNOSIS — I608 Other nontraumatic subarachnoid hemorrhage: Secondary | ICD-10-CM | POA: Insufficient documentation

## 2013-11-15 DIAGNOSIS — I671 Cerebral aneurysm, nonruptured: Secondary | ICD-10-CM | POA: Insufficient documentation

## 2013-11-24 DIAGNOSIS — F17200 Nicotine dependence, unspecified, uncomplicated: Secondary | ICD-10-CM

## 2014-02-12 ENCOUNTER — Encounter: Payer: Self-pay | Admitting: Internal Medicine

## 2014-07-27 DIAGNOSIS — K5900 Constipation, unspecified: Secondary | ICD-10-CM | POA: Insufficient documentation

## 2014-08-02 ENCOUNTER — Encounter: Payer: Self-pay | Admitting: Internal Medicine

## 2015-01-12 ENCOUNTER — Encounter: Payer: Self-pay | Admitting: Internal Medicine

## 2016-03-16 LAB — HM MAMMOGRAPHY: HM Mammogram: NORMAL (ref 0–4)

## 2016-07-12 ENCOUNTER — Other Ambulatory Visit: Payer: Self-pay

## 2016-07-12 ENCOUNTER — Telehealth: Payer: Self-pay

## 2016-07-12 ENCOUNTER — Telehealth: Payer: Self-pay | Admitting: Family Medicine

## 2016-07-12 MED ORDER — QUETIAPINE FUMARATE 200 MG PO TABS: 200.0000 mg | ORAL_TABLET | Freq: Three times a day (TID) | ORAL | 0 refills | Status: DC

## 2016-07-12 NOTE — Telephone Encounter (Signed)
Sent in Seroquel per Dr.Lalonde

## 2016-07-12 NOTE — Telephone Encounter (Signed)
Pt and daughter called to reschedule her New Patient appointment that was scheduled with Vickie on tomorrow since Loletha Carrow is sick however they want to know if there is anyway that pt can get a a few pills refilled on Seroquel to last until she comes in to see Vickie on 07/17/16. She is out of this med and previous doctor will not refill anymore since she is transferring to another doctor.  If refill can be sent, send to [place], [place]

## 2016-07-12 NOTE — Telephone Encounter (Signed)
Records recvd from [facility] on this patient placed in your folder for review. Michelle [name]

## 2016-07-12 NOTE — Telephone Encounter (Signed)
Go ahead and give her a month's supply

## 2016-07-12 NOTE — Telephone Encounter (Signed)
I have sent in Seroquel 200 mg TID per [employer]

## 2016-07-13 ENCOUNTER — Institutional Professional Consult (permissible substitution): Payer: Self-pay | Admitting: Family Medicine

## 2016-07-17 ENCOUNTER — Ambulatory Visit (INDEPENDENT_AMBULATORY_CARE_PROVIDER_SITE_OTHER): Payer: BLUE CROSS/BLUE SHIELD | Admitting: Family Medicine

## 2016-07-17 ENCOUNTER — Encounter: Payer: Self-pay | Admitting: Family Medicine

## 2016-07-17 ENCOUNTER — Telehealth: Payer: Self-pay

## 2016-07-17 VITALS — BP 126/78 | HR 95 | Wt 141.4 lb

## 2016-07-17 DIAGNOSIS — Z9189 Other specified personal risk factors, not elsewhere classified: Secondary | ICD-10-CM

## 2016-07-17 DIAGNOSIS — G2581 Restless legs syndrome: Secondary | ICD-10-CM

## 2016-07-17 DIAGNOSIS — F329 Major depressive disorder, single episode, unspecified: Secondary | ICD-10-CM

## 2016-07-17 DIAGNOSIS — C4491 Basal cell carcinoma of skin, unspecified: Secondary | ICD-10-CM | POA: Insufficient documentation

## 2016-07-17 DIAGNOSIS — Z7689 Persons encountering health services in other specified circumstances: Secondary | ICD-10-CM

## 2016-07-17 DIAGNOSIS — Z915 Personal history of self-harm: Secondary | ICD-10-CM | POA: Diagnosis not present

## 2016-07-17 DIAGNOSIS — Z9151 Personal history of suicidal behavior: Secondary | ICD-10-CM

## 2016-07-17 DIAGNOSIS — F419 Anxiety disorder, unspecified: Secondary | ICD-10-CM | POA: Diagnosis not present

## 2016-07-17 DIAGNOSIS — F32A Depression, unspecified: Secondary | ICD-10-CM

## 2016-07-17 NOTE — Progress Notes (Signed)
Subjective:    Patient ID: Michelle [name], female    DOB: 02-27-1961, 56 y.o.   MRN: [id number]  HPI Chief Complaint  Patient presents with  . new pt    new pt get established  and wants refills on med   She is new to the practice and here to establish care.  States she lived in [place] but moved away approximately 5 years ago after her husband passed away and moved back here 3 weeks ago. States she lived in [place] for 5 years.  She has 2 daughters and parents who live here. She lives alone in an apartment.  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 Was treated at [facility]. Reports having residual right leg weakness and uses a cane sometimes. States she refused PT when it was recommended and has been doing exercises on her own.  States she can drive but her car is not functioning currently. She relies on her daughter to transport her.   Is taking finasteride for hair loss and this was prescribed by her dermatologist.  States she does not think it is working. She has not called her dermatologist to discuss this.   States her thyroid has been checked recently.   Denies fever, chills, headache, dizziness, chest pain, palpitations, shortness of breath, abdominal pain, N/V/D.   States she has not followed up with her neurosurgeon in [place] as recommended.    States she was seeing a psychiatrist in [place] for anxiety and depression. She has a complex mental health history with a suicide attempt and benzodiazapine overdose.  She denies SI or HI today. States she actually feels much better emotionally since being back in [place].   States she has had skin cancer in the past, BCC.  Dermatologist is Dr. Gasper Sells in [place].  Dr. Hollice Espy was PCP in [place].   Reports history of RLS and takes Gabapentin for this. States she has noticed improvement and no side effects.   She smokes daily and states she is not interested in stopping. Drinks alcohol once per month. Denies  drug use.  States she had a CPE last year with labs.   Mammogram last year.  Colonoscopy: never    Depression screen [facility] 2/9 07/17/2016  Decreased Interest 2  Down, Depressed, Hopeless 3  PHQ - 2 Score 5  Altered sleeping 2  Tired, decreased energy 3  Change in appetite 2  Feeling bad or failure about yourself  3  Trouble concentrating 3  Moving slowly or fidgety/restless 3  Suicidal thoughts 0  PHQ-9 Score 21   GAD 7 : Generalized Anxiety Score 07/17/2016  Nervous, Anxious, on Edge 3  Control/stop worrying 3  Worry too much - different things 3  Trouble relaxing 3  Restless 3  Easily annoyed or irritable 3  Afraid - awful might happen 3  Total GAD 7 Score 21  Anxiety Difficulty Not difficult at all    Reviewed allergies, medications, past medical,  and social history.   Review of Systems Pertinent positives and negatives in the history of present illness.     Objective:   Physical Exam  Constitutional: She is oriented to person, place, and time. She appears well-developed and well-nourished. No distress.  Neurological: She is alert and oriented to person, place, and time.  Skin: Skin is warm and dry. No pallor.  Psychiatric: She has a normal mood and affect. Her speech is normal and behavior is normal. Thought content normal. She expresses  no homicidal and no suicidal ideation.   BP 126/78   Pulse 95   Wt 141 lb 6.4 oz (64.1 kg)   BMI 25.86 kg/m       Assessment & Plan:  Depression, prolonged - Plan: Ambulatory referral to Psychiatry  Anxiety - Plan: Ambulatory referral to Psychiatry  Encounter to establish care  History of suicide attempt - Plan: Ambulatory referral to Psychiatry  History of drug overdose - Plan: Ambulatory referral to Psychiatry  RLS (restless legs syndrome)  Referral made to psychiatry for treatment of anxiety and depression due to complex psychiatric history with benzodiazapine and suicide attempt. She is aware that she needs to  call and schedule the appointment. She does not appear to be in any danger and has no thoughts of SI or HI. States she does not need any refills on medications at this time.  She will continue on Gabapentin for RLS, this seems to be helping her.  Will review medical records from previous PCP, records have been received. Recent CPE and labs in September 2017.

## 2016-07-17 NOTE — Patient Instructions (Addendum)
You can call to schedule your appointment with the psychiatrist. A few offices are listed below for you to call.   [facility]  [address], [place], [place] [address]  Phone: [phone]  [address] [place], [place] [address]  Phone: [phone]  Call your neurosurgeon to follow up.

## 2016-07-17 NOTE — Telephone Encounter (Signed)
Records rcvd via mail from [facility] in your folder for review. Michelle [name]

## 2016-07-17 NOTE — Telephone Encounter (Signed)
Records from [facility] and [facility] placed in your folder for review. Victorino December

## 2016-07-23 ENCOUNTER — Encounter: Payer: Self-pay | Admitting: Family Medicine

## 2016-07-26 ENCOUNTER — Telehealth: Payer: Self-pay

## 2016-07-26 NOTE — Telephone Encounter (Signed)
Records placed in your folder for [facility]. Michelle [name]

## 2016-08-14 ENCOUNTER — Encounter: Payer: Self-pay | Admitting: Family Medicine

## 2016-08-15 ENCOUNTER — Ambulatory Visit (INDEPENDENT_AMBULATORY_CARE_PROVIDER_SITE_OTHER): Payer: BLUE CROSS/BLUE SHIELD | Admitting: Family Medicine

## 2016-08-15 ENCOUNTER — Encounter: Payer: Self-pay | Admitting: Family Medicine

## 2016-08-15 VITALS — BP 120/76 | HR 99 | Wt 149.8 lb

## 2016-08-15 DIAGNOSIS — Z09 Encounter for follow-up examination after completed treatment for conditions other than malignant neoplasm: Secondary | ICD-10-CM

## 2016-08-15 DIAGNOSIS — E7800 Pure hypercholesterolemia, unspecified: Secondary | ICD-10-CM

## 2016-08-15 DIAGNOSIS — M25551 Pain in right hip: Secondary | ICD-10-CM | POA: Diagnosis not present

## 2016-08-15 NOTE — Progress Notes (Signed)
   Subjective:    Patient ID: Michelle [name], female    DOB: [birth year], 56 y.o.   MRN: [id number]  HPI Chief Complaint  Patient presents with  . 4 week follow-up    4 week follow-up also having pain in groin on right side   She is here for a 4 week follow up on anxiety.  States she saw a psychiatrist as recommended.  Dr. Elane Fritz at [facility]. States one of her medication dosages was changed.   She has a new complaint today.  Complains of right groin pain that started last week. No known injury. Pain is non radiating.  Pain is worse with walking and improved with rest. She has right leg weakness from a brain aneurysm. She ambulates with a cane.  Denies fever, chills, numbness, tingling. No pain when sleeping.   Complains of hair loss gradual since 2013. She has been taking finasteride but stopped. This was prescribed by her dermatologist in [place]. She may want to see a dermatologist in [place] but would like to hold off on this for now.   Denies fever, chills, night sweats, unexplained weight changes, fatigue.   Reviewed allergies, medications, past medical, surgical, social history.   Review of Systems Pertinent positives and negatives in the history of present illness.     Objective:   Physical Exam  Constitutional: She is oriented to person, place, and time. She appears well-developed and well-nourished. No distress.  Musculoskeletal:       Right hip: She exhibits decreased range of motion, decreased strength and tenderness.       Legs: Tenderness to right anterior hip and groin. No erythema, edema, or rash.  Decreased ROM and strength with history of weakness post cerebral aneurysm. Pain with flexion.  RLE is neurovascularly intact.   Neurological: She is alert and oriented to person, place, and time.  Skin: Skin is warm and dry. No pallor.   BP 120/76   Pulse 99   Wt 149 lb 12.8 oz (67.9 kg)   BMI 27.40 kg/m       Assessment & Plan:  Acute hip  pain, right - Plan: DG HIP UNILAT WITH PELVIS 2-3 VIEWS RIGHT  Follow up  Elevated LDL cholesterol level  She is doing well and has established with a psychiatrist.   Discussed conservative therapy for right groin and anterior hip pain. Tenderness with palpation and pain with movement speaks to this being a musculoskeletal etiology. No systemic symptoms. Plan to get an XR to rule out underlying etiology.   She questioned whether she needs to be on cholesterol medication. I reviewed her labs that were scanned into the chart and done in 01/2016 by her previous PCP. Discussed that per standard of care she has an ASCVD 10 year risk of 6.4% and does not require medication at this point. Discussed healthy lifestyle modifications. Will follow up pending XR.

## 2016-08-15 NOTE — Patient Instructions (Signed)
Go for the X ray of your right hip. Use heat, anti-inflammatory or Tylenol for pain. We will call you with the results.

## 2016-08-16 ENCOUNTER — Other Ambulatory Visit: Payer: Self-pay | Admitting: Family Medicine

## 2016-08-16 ENCOUNTER — Ambulatory Visit
Admission: RE | Admit: 2016-08-16 | Discharge: 2016-08-16 | Disposition: A | Discharge status: Home / Self Care | Payer: Self-pay | Source: Ambulatory Visit | Attending: Family Medicine | Admitting: Family Medicine

## 2016-08-16 DIAGNOSIS — M25551 Pain in right hip: Secondary | ICD-10-CM

## 2016-08-16 MED ORDER — TRAMADOL HCL 50 MG PO TABS: 50.0000 mg | ORAL_TABLET | Freq: Three times a day (TID) | ORAL | 0 refills | Status: DC | PRN

## 2016-08-23 ENCOUNTER — Telehealth: Payer: Self-pay | Admitting: Family Medicine

## 2016-08-23 ENCOUNTER — Other Ambulatory Visit: Payer: Self-pay | Admitting: Family Medicine

## 2016-08-23 DIAGNOSIS — G8929 Other chronic pain: Secondary | ICD-10-CM

## 2016-08-23 DIAGNOSIS — M25559 Pain in unspecified hip: Principal | ICD-10-CM

## 2016-08-23 MED ORDER — HYDROCODONE-ACETAMINOPHEN 5-325 MG PO TABS: 1.0000 | ORAL_TABLET | Freq: Four times a day (QID) | ORAL | 0 refills | Status: DC | PRN

## 2016-08-23 NOTE — Telephone Encounter (Signed)
Pt referral into epic and pt will be called

## 2016-08-23 NOTE — Telephone Encounter (Signed)
Please let her know that I can give her only a few hydrocodone tablets but I am unable to treat chronic pain with narcotics. I recommend we get her to an orthopedist to get help with this.

## 2016-08-23 NOTE — Telephone Encounter (Signed)
Pt informed

## 2016-08-23 NOTE — Addendum Note (Signed)
Addended by: Minette Headland A on: 08/23/2016 04:13 PM   Modules accepted: Orders

## 2016-08-23 NOTE — Telephone Encounter (Signed)
Pt called and stated that she is still having issues with pain. She would like something sent in for her a little stronger that tramadol. Pt uses [facility].

## 2016-09-07 ENCOUNTER — Ambulatory Visit (INDEPENDENT_AMBULATORY_CARE_PROVIDER_SITE_OTHER): Payer: BLUE CROSS/BLUE SHIELD | Admitting: Orthopaedic Surgery

## 2016-09-17 ENCOUNTER — Encounter (INDEPENDENT_AMBULATORY_CARE_PROVIDER_SITE_OTHER): Payer: Self-pay | Admitting: Orthopaedic Surgery

## 2016-09-17 ENCOUNTER — Ambulatory Visit (INDEPENDENT_AMBULATORY_CARE_PROVIDER_SITE_OTHER): Payer: BLUE CROSS/BLUE SHIELD | Admitting: Orthopaedic Surgery

## 2016-09-17 DIAGNOSIS — M1611 Unilateral primary osteoarthritis, right hip: Secondary | ICD-10-CM | POA: Diagnosis not present

## 2016-09-17 MED ORDER — TRAMADOL HCL 50 MG PO TABS: 50.0000 mg | ORAL_TABLET | Freq: Four times a day (QID) | ORAL | 0 refills | Status: DC | PRN

## 2016-09-17 NOTE — Progress Notes (Signed)
Office Visit Note   Patient: Michelle [name]           Date of Birth: 1961-01-21           MRN: [id number] Visit Date: 09/17/2016              Requested by: Girtha Rm, NP-C [place], [place] [address] PCP: Harland Dingwall, NP-C   Assessment & Plan: Visit Diagnoses:  1. Primary osteoarthritis of right hip     Plan:  Referral to Dr. Ernestina Patches for intra-articular steroid injection is given. Follow-up in 6 weeks for recheck. X-rays do show moderate joint space narrowing  Follow-Up Instructions: Return in about 6 weeks (around 10/29/2016).   Orders:  Orders Placed This Encounter  Procedures  . Ambulatory referral to Physical Medicine Rehab   Meds ordered this encounter  Medications  . traMADol (ULTRAM) 50 MG tablet    Sig: Take 1 tablet (50 mg total) by mouth every 6 (six) hours as needed.    Dispense:  30 tablet    Refill:  0      Procedures: No procedures performed   Clinical Data: No additional findings.   Subjective: Chief Complaint  Patient presents with  . Right Hip - Pain    Michelle [name] is a 56 year old female with right hip pain for at least 3 months with significant groin pain and limping. She ambulates with a cane. She has burning tingling and numbness that will radiate down into the knee. She denies any back pain. She denies any injuries. She has taken Tylenol with partial relief.    Review of Systems  Constitutional: Negative.   HENT: Negative.   Eyes: Negative.   Respiratory: Negative.   Cardiovascular: Negative.   Endocrine: Negative.   Musculoskeletal: Negative.   Neurological: Negative.   Hematological: Negative.   Psychiatric/Behavioral: Negative.   All other systems reviewed and are negative.    Objective: Vital Signs: There were no vitals taken for this visit.  Physical Exam  Constitutional: She is oriented to person, place, and time. She appears well-developed and well-nourished.  HENT:  Head: Normocephalic and  atraumatic.  Eyes: EOM are normal.  Neck: Neck supple.  Pulmonary/Chest: Effort normal.  Abdominal: Soft.  Neurological: She is alert and oriented to person, place, and time.  Skin: Skin is warm. Capillary refill takes less than 2 seconds.  Psychiatric: She has a normal mood and affect. Her behavior is normal. Judgment and thought content normal.  Nursing note and vitals reviewed.   Ortho Exam   Right hip exam shows painful range of motion with external stinchfield sign. Lateral hip is nontender. No sciatic tension signs. Specialty Comments:  No specialty comments available.  Imaging: No results found.   PMFS History: Patient Active Problem List   Diagnosis Date Noted  . BCC (basal cell carcinoma of skin) 07/17/2016  . Dysuria 02/10/2012  . Rash 02/04/2012  . Benzodiazepine overdose 12/03/2011  . Genital herpes 01/03/2011  . Leg weakness, bilateral 01/02/2011  . Vaginal lesion 01/02/2011  . Leukocytosis 01/02/2011  . Major depression 11/16/2010  . Nausea & vomiting 11/16/2010  . Abdominal tenderness 11/16/2010  . Right flank tenderness 11/16/2010  . Preventative health care 10/24/2010  . HTN (hypertension) 08/30/2010  . VITAMIN B12 DEFICIENCY 07/26/2009  . ANEMIA-B12 DEFICIENCY 07/11/2009  . GERD 07/11/2009  . ANEMIA-IRON DEFICIENCY 06/21/2009  . [place] DISEASE, CERVICAL 05/03/2009  . BACK PAIN 07/01/2007  . 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 HYPERLIPIDEMIA 02/06/2007  .  Anxiety state 02/06/2007  . Depression, prolonged 02/06/2007  . MIGRAINE HEADACHE 02/06/2007  . OSTEOARTHRITIS 02/06/2007  . LOW BACK PAIN 02/06/2007  . Insomnia, unspecified 02/06/2007   Past Medical History:  Diagnosis Date  . ABDOMINAL PAIN RIGHT LOWER QUADRANT 07/26/2009  . Abdominal pain, left lower quadrant 07/26/2009  . ABDOMINAL PAIN, LOWER 11/29/2009  . ANEMIA-B12 DEFICIENCY 07/11/2009  . ANEMIA-IRON DEFICIENCY 06/21/2009  . ANXIETY 02/06/2007  . BACK PAIN 07/01/2007  .  Benzodiazepine overdose 12/03/2011   July 2013  . CHEST PAIN 06/21/2009  . DEPRESSION 02/06/2007  . [place] DISEASE, CERVICAL 05/03/2009  . DYSPEPSIA 10/23/2007  . EAR PAIN, RIGHT 11/29/2009  . ELBOW PAIN, RIGHT 06/27/2010  . ELEVATED BLOOD PRESSURE WITHOUT DIAGNOSIS OF HYPERTENSION 07/01/2007  . FEVER UNSPECIFIED 06/03/2008  . FREQUENCY, URINARY 03/12/2007  . Genital herpes 01/03/2011  . GERD 07/11/2009  . Glossitis 10/23/2007  . HEAD TRAUMA, CLOSED 11/29/2009  . History of surgery for cerebral aneurysm 2015  . HYPERLIPIDEMIA 02/06/2007  . Insomnia, unspecified 02/06/2007  . LATERAL EPICONDYLITIS, LEFT 06/03/2008  . LOW BACK PAIN 02/06/2007  . Major depression 11/16/2010  . MIGRAINE HEADACHE 02/06/2007  . OSTEOARTHRITIS 02/06/2007  . 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 OTITIS EXTERNA, RIGHT 10/04/2009  . VITAMIN B12 DEFICIENCY 07/26/2009    Family History  Problem Relation Age of Onset  . Diabetes Other   . Hypertension Other   . Coronary artery disease Other 2    Female 1st degree relative    Past Surgical History:  Procedure Laterality Date  . ABDOMINAL HYSTERECTOMY  2004  . APPENDECTOMY    . CEREBRAL ANEURYSM REPAIR    . right ankle surgury    . s/p c-spine fusion c 5-6     Social History   Occupational History  . 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 Smoking status: Current Every Day Smoker    Packs/day: 10.00    Types: Cigarettes  . Smokeless tobacco: Never Used  . Alcohol use No  . Drug use: No  . Sexual activity: Not on file

## 2016-09-25 ENCOUNTER — Encounter: Payer: Self-pay | Admitting: Family Medicine

## 2016-09-28 ENCOUNTER — Ambulatory Visit (INDEPENDENT_AMBULATORY_CARE_PROVIDER_SITE_OTHER): Payer: Self-pay

## 2016-09-28 ENCOUNTER — Ambulatory Visit (INDEPENDENT_AMBULATORY_CARE_PROVIDER_SITE_OTHER): Payer: BLUE CROSS/BLUE SHIELD | Admitting: Physical Medicine and Rehabilitation

## 2016-09-28 ENCOUNTER — Encounter (INDEPENDENT_AMBULATORY_CARE_PROVIDER_SITE_OTHER): Payer: Self-pay | Admitting: Physical Medicine and Rehabilitation

## 2016-09-28 VITALS — BP 136/91 | HR 104

## 2016-09-28 DIAGNOSIS — M25551 Pain in right hip: Secondary | ICD-10-CM

## 2016-09-28 NOTE — Progress Notes (Signed)
JOCI DRESS - 56 y.o. female MRN [id number]  Date of birth: 05-10-1961  Office Visit Note: Visit Date: 09/28/2016 PCP: Girtha Rm, NP-C Referred by: Girtha Rm, NP-C  Subjective: Chief Complaint  Patient presents with  . Right Hip - Pain   HPI: Mrs. Waddle is a 56 year old female followed by Dr. Erlinda Hong. She reports chronic worsening constant right hip and groin pain but also pain down to the foot at times. She has worsening symptoms with climbing stairs and getting out of a car. He requests diagnostic and hopefully therapeutic anesthetic hip arthrogram.    ROS Otherwise per HPI.  Assessment & Plan: Visit Diagnoses:  1. Pain in right hip     Plan: Findings:  Right diagnostic and hopefully therapeutic anesthetic hip arthrogram. Patient did have some relief during the anesthetic phase.    Meds & Orders: No orders of the defined types were placed in this encounter.   Orders Placed This Encounter  Procedures  . Large Joint Injection/Arthrocentesis  . XR C-ARM NO REPORT    Follow-up: Return for Dr. Erlinda Hong.   Procedures: Large Joint Inj Date/Time: 09/28/2016 9:55 AM Performed by: Magnus Sinning Authorized by: Magnus Sinning   Consent Given by:  Patient Site marked: the procedure site was marked   Timeout: prior to procedure the correct patient, procedure, and site was verified   Indications:  Pain and diagnostic evaluation Location:  Hip Site:  R hip joint Prep: patient was prepped and draped in usual sterile fashion   Needle Size:  22 G Approach:  Anterior Ultrasound Guidance: No   Fluoroscopic Guidance: No   Arthrogram: Yes   Medications:  80 mg triamcinolone acetonide 40 MG/ML; 3 mL bupivacaine 0.5 % Aspiration Attempted: Yes   Patient tolerance:  Patient tolerated the procedure well with no immediate complications  Arthrogram demonstrated excellent flow of contrast throughout the joint surface without extravasation or obvious defect.  The patient had  relief of symptoms during the anesthetic phase of the injection.     No notes on file   Clinical History: No specialty comments available.  She reports that she has been smoking Cigarettes.  She has been smoking about 10.00 packs per day. She has never used smokeless tobacco. No results for input(s): HGBA1C, LABURIC in the last 8760 hours.  Objective:  VS:  HT:    WT:   BMI:     BP:(!) 136/91  HR:(!) 104bpm  TEMP: ( )  RESP:  Physical Exam  Musculoskeletal:  Patient does have pain with hip rotation.    Ortho Exam Imaging: No results found.  Past Medical/Family/Surgical/Social History: Medications & Allergies reviewed per EMR Patient Active Problem List   Diagnosis Date Noted  . BCC (basal cell carcinoma of skin) 07/17/2016  . Dysuria 02/10/2012  . Rash 02/04/2012  . Benzodiazepine overdose 12/03/2011  . Genital herpes 01/03/2011  . Leg weakness, bilateral 01/02/2011  . Vaginal lesion 01/02/2011  . Leukocytosis 01/02/2011  . Major depression 11/16/2010  . Nausea & vomiting 11/16/2010  . Abdominal tenderness 11/16/2010  . Right flank tenderness 11/16/2010  . Preventative health care 10/24/2010  . HTN (hypertension) 08/30/2010  . VITAMIN B12 DEFICIENCY 07/26/2009  . ANEMIA-B12 DEFICIENCY 07/11/2009  . GERD 07/11/2009  . ANEMIA-IRON DEFICIENCY 06/21/2009  . [place] DISEASE, CERVICAL 05/03/2009  . BACK PAIN 07/01/2007  . 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 HYPERLIPIDEMIA 02/06/2007  . Anxiety state 02/06/2007  . Depression, prolonged 02/06/2007  . MIGRAINE HEADACHE 02/06/2007  . OSTEOARTHRITIS  02/06/2007  . LOW BACK PAIN 02/06/2007  . Insomnia, unspecified 02/06/2007   Past Medical History:  Diagnosis Date  . ABDOMINAL PAIN RIGHT LOWER QUADRANT 07/26/2009  . Abdominal pain, left lower quadrant 07/26/2009  . ABDOMINAL PAIN, LOWER 11/29/2009  . ANEMIA-B12 DEFICIENCY 07/11/2009  . ANEMIA-IRON DEFICIENCY 06/21/2009  . ANXIETY 02/06/2007  . BACK PAIN 07/01/2007  .  Benzodiazepine overdose 12/03/2011   July 2013  . CHEST PAIN 06/21/2009  . DEPRESSION 02/06/2007  . [place] DISEASE, CERVICAL 05/03/2009  . DYSPEPSIA 10/23/2007  . EAR PAIN, RIGHT 11/29/2009  . ELBOW PAIN, RIGHT 06/27/2010  . ELEVATED BLOOD PRESSURE WITHOUT DIAGNOSIS OF HYPERTENSION 07/01/2007  . FEVER UNSPECIFIED 06/03/2008  . FREQUENCY, URINARY 03/12/2007  . Genital herpes 01/03/2011  . GERD 07/11/2009  . Glossitis 10/23/2007  . HEAD TRAUMA, CLOSED 11/29/2009  . History of surgery for cerebral aneurysm 2015  . HYPERLIPIDEMIA 02/06/2007  . Insomnia, unspecified 02/06/2007  . LATERAL EPICONDYLITIS, LEFT 06/03/2008  . LOW BACK PAIN 02/06/2007  . Major depression 11/16/2010  . MIGRAINE HEADACHE 02/06/2007  . OSTEOARTHRITIS 02/06/2007  . 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 OTITIS EXTERNA, RIGHT 10/04/2009  . VITAMIN B12 DEFICIENCY 07/26/2009   Family History  Problem Relation Age of Onset  . Diabetes Other   . Hypertension Other   . Coronary artery disease Other 75    Female 1st degree relative   Past Surgical History:  Procedure Laterality Date  . ABDOMINAL HYSTERECTOMY  2004  . APPENDECTOMY    . CEREBRAL ANEURYSM REPAIR    . right ankle surgury    . s/p c-spine fusion c 5-6     Social History   Occupational History  . 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 Smoking status: Current Every Day Smoker    Packs/day: 10.00    Types: Cigarettes  . Smokeless tobacco: Never Used  . Alcohol use No  . Drug use: No  . Sexual activity: Not on file

## 2016-09-28 NOTE — Progress Notes (Deleted)
Right hip and groin pain. Constant. Pain down leg to foot. Worse with stairs and getting in and out of car.

## 2016-09-28 NOTE — Patient Instructions (Signed)

## 2016-10-01 MED ORDER — TRIAMCINOLONE ACETONIDE 40 MG/ML IJ SUSP
80.0000 mg | INTRAMUSCULAR | Status: AC | PRN
  Administered 2016-09-28: 80 mg via INTRA_ARTICULAR

## 2016-10-01 MED ORDER — BUPIVACAINE HCL 0.5 % IJ SOLN
3.0000 mL | INTRAMUSCULAR | Status: AC | PRN
  Administered 2016-09-28: 3 mL via INTRA_ARTICULAR

## 2016-10-02 ENCOUNTER — Encounter: Payer: Self-pay | Admitting: Family Medicine

## 2016-10-09 ENCOUNTER — Encounter: Payer: Self-pay | Admitting: Family Medicine

## 2016-10-16 ENCOUNTER — Encounter: Payer: Self-pay | Admitting: Family Medicine

## 2016-10-16 ENCOUNTER — Ambulatory Visit (INDEPENDENT_AMBULATORY_CARE_PROVIDER_SITE_OTHER): Payer: BLUE CROSS/BLUE SHIELD | Admitting: Family Medicine

## 2016-10-16 VITALS — BP 110/70 | HR 91 | Wt 153.0 lb

## 2016-10-16 DIAGNOSIS — R609 Edema, unspecified: Secondary | ICD-10-CM | POA: Diagnosis not present

## 2016-10-16 LAB — COMPREHENSIVE METABOLIC PANEL
ALBUMIN: 3.3 g/dL — AB (ref 3.6–5.1)
ALT: 12 U/L (ref 6–29)
AST: 22 U/L (ref 10–35)
Alkaline Phosphatase: 153 U/L — ABNORMAL HIGH (ref 33–130)
BUN: 6 mg/dL — ABNORMAL LOW (ref 7–25)
CALCIUM: 9.1 mg/dL (ref 8.6–10.4)
CHLORIDE: 105 mmol/L (ref 98–110)
CO2: 24 mmol/L (ref 20–31)
CREATININE: 0.66 mg/dL (ref 0.50–1.05)
Glucose, Bld: 92 mg/dL (ref 65–99)
Potassium: 3.6 mmol/L (ref 3.5–5.3)
SODIUM: 142 mmol/L (ref 135–146)
Total Bilirubin: 0.3 mg/dL (ref 0.2–1.2)
Total Protein: 5.8 g/dL — ABNORMAL LOW (ref 6.1–8.1)

## 2016-10-16 LAB — CBC WITH DIFFERENTIAL/PLATELET
Basophils Absolute: 0 cells/uL (ref 0–200)
Basophils Relative: 0 %
EOS ABS: 208 {cells}/uL (ref 15–500)
EOS PCT: 2 %
HCT: 33.1 % — ABNORMAL LOW (ref 35.0–45.0)
HEMOGLOBIN: 11 g/dL — AB (ref 11.7–15.5)
LYMPHS ABS: 2912 {cells}/uL (ref 850–3900)
Lymphocytes Relative: 28 %
MCH: 28.8 pg (ref 27.0–33.0)
MCHC: 33.2 g/dL (ref 32.0–36.0)
MCV: 86.6 fL (ref 80.0–100.0)
MONOS PCT: 7 %
MPV: 9.1 fL (ref 7.5–12.5)
Monocytes Absolute: 728 cells/uL (ref 200–950)
NEUTROS ABS: 6552 {cells}/uL (ref 1500–7800)
Neutrophils Relative %: 63 %
Platelets: 319 10*3/uL (ref 140–400)
RBC: 3.82 MIL/uL (ref 3.80–5.10)
RDW: 14.9 % (ref 11.0–15.0)
WBC: 10.4 10*3/uL (ref 4.0–10.5)

## 2016-10-16 NOTE — Progress Notes (Signed)
   Subjective:    Patient ID: Michelle [name], female    DOB: May 28, 1961, 56 y.o.   MRN: [id number]  HPI She complains of a three-week history of bilateral leg edema. She has not changed her daily activities. Her medications are unchanged. She's had no chest pain, shortness of breath, PND, DOE, weakness. She notes swelling and no other areas. No urinary symptoms. Apparently she had a previous episode of this 2 years ago and spontaneously resolved.   Review of Systems     Objective:   Physical Exam Alert and in no distress. Tympanic membranes and canals are normal. Pharyngeal area is normal. Neck is supple without adenopathy or thyromegaly. Cardiac exam shows a regular sinus rhythm without murmurs or gallops. Lungs are clear to auscultation. 2-3 + pitting edema is noted. Negative Homans sign. Skin is warm and dry. Pulses difficult to feel due to the edema.  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     Assessment & Plan:  Peripheral edema - Plan: EKG 12-Lead, CBC with Differential/Platelet, Comprehensive metabolic panel

## 2016-10-17 ENCOUNTER — Other Ambulatory Visit: Payer: Self-pay | Admitting: Family Medicine

## 2016-10-17 ENCOUNTER — Encounter: Payer: Self-pay | Admitting: Family Medicine

## 2016-10-17 DIAGNOSIS — R609 Edema, unspecified: Secondary | ICD-10-CM

## 2016-10-17 DIAGNOSIS — R7989 Other specified abnormal findings of blood chemistry: Secondary | ICD-10-CM

## 2016-10-17 DIAGNOSIS — R9431 Abnormal electrocardiogram [ECG] [EKG]: Secondary | ICD-10-CM

## 2016-10-17 LAB — BRAIN NATRIURETIC PEPTIDE: Brain Natriuretic Peptide: 369.6 pg/mL — ABNORMAL HIGH (ref <100)

## 2016-10-17 MED ORDER — FUROSEMIDE 20 MG PO TABS: 20.0000 mg | ORAL_TABLET | Freq: Every day | ORAL | 0 refills | Status: DC

## 2016-10-19 ENCOUNTER — Ambulatory Visit
Admission: RE | Admit: 2016-10-19 | Discharge: 2016-10-19 | Disposition: A | Discharge status: Home / Self Care | Payer: BLUE CROSS/BLUE SHIELD | Source: Ambulatory Visit | Attending: Family Medicine | Admitting: Family Medicine

## 2016-10-19 DIAGNOSIS — R7989 Other specified abnormal findings of blood chemistry: Secondary | ICD-10-CM

## 2016-10-19 DIAGNOSIS — R9431 Abnormal electrocardiogram [ECG] [EKG]: Secondary | ICD-10-CM

## 2016-10-19 DIAGNOSIS — R609 Edema, unspecified: Secondary | ICD-10-CM

## 2016-10-25 ENCOUNTER — Encounter (INDEPENDENT_AMBULATORY_CARE_PROVIDER_SITE_OTHER): Payer: Self-pay

## 2016-10-25 ENCOUNTER — Ambulatory Visit (INDEPENDENT_AMBULATORY_CARE_PROVIDER_SITE_OTHER): Payer: BLUE CROSS/BLUE SHIELD | Admitting: Orthopaedic Surgery

## 2016-10-25 ENCOUNTER — Ambulatory Visit (INDEPENDENT_AMBULATORY_CARE_PROVIDER_SITE_OTHER): Payer: BLUE CROSS/BLUE SHIELD | Admitting: Family Medicine

## 2016-10-25 ENCOUNTER — Encounter: Payer: Self-pay | Admitting: Family Medicine

## 2016-10-25 VITALS — BP 120/80 | HR 82 | Temp 98.0°F | Resp 16 | Wt 149.8 lb

## 2016-10-25 DIAGNOSIS — R609 Edema, unspecified: Secondary | ICD-10-CM

## 2016-10-25 DIAGNOSIS — R0602 Shortness of breath: Secondary | ICD-10-CM

## 2016-10-25 DIAGNOSIS — F17200 Nicotine dependence, unspecified, uncomplicated: Secondary | ICD-10-CM

## 2016-10-25 DIAGNOSIS — I10 Essential (primary) hypertension: Secondary | ICD-10-CM

## 2016-10-25 LAB — BASIC METABOLIC PANEL
BUN: 12 mg/dL (ref 7–25)
CALCIUM: 9.6 mg/dL (ref 8.6–10.4)
CO2: 25 mmol/L (ref 20–31)
Chloride: 104 mmol/L (ref 98–110)
Creat: 0.74 mg/dL (ref 0.50–1.05)
GLUCOSE: 76 mg/dL (ref 65–99)
POTASSIUM: 4.3 mmol/L (ref 3.5–5.3)
SODIUM: 142 mmol/L (ref 135–146)

## 2016-10-25 NOTE — Progress Notes (Signed)
   Subjective:    Patient ID: Michelle [name], female    DOB: June 13, 1960, 56 y.o.   MRN: [id number]  HPI Chief Complaint  Patient presents with  . 1 week follow-up    1 week follow-up on blood work and xray   She is here for follow up on abnormal labs and chest XR.  She was seen by Dr. Redmond School last week for peripheral edema and had an abnormal ECG and elevated BNP. She started on lasix. Referral was made to cardiology and she has an appointment next Monday.  Denies any concerns today. States her breathing has improved but she still feels like she cannot take a deep breath at times. Peripheral edema has improved.  Her daughter is with her and her concern is that her mother still smokes. Patient is not ready to quit.   Denies fever, chills, unexplained weight change, dizziness, chest pain, palpitations, cough, orthopnea, abdominal pain, N/V/D, urinary symptoms.   Reviewed allergies, medications, past medical, surgical,  and social history.    Review of Systems Pertinent positives and negatives in the history of present illness.     Objective:   Physical Exam  Constitutional: She is oriented to person, place, and time. She appears well-developed and well-nourished. No distress.  HENT:  Mouth/Throat: Oropharynx is clear and moist.  Eyes: Conjunctivae and EOM are normal. Pupils are equal, round, and reactive to light.  Neck: Normal range of motion. Neck supple.  Cardiovascular: Normal rate, regular rhythm, normal heart sounds and intact distal pulses.  Exam reveals no gallop and no friction rub.   No murmur heard. LE  1+ pitting edema bilaterally    Pulmonary/Chest: Effort normal and breath sounds normal.  Lymphadenopathy:    She has no cervical adenopathy.  Neurological: She is alert and oriented to person, place, and time. She has normal strength. No cranial nerve deficit or sensory deficit. Gait normal.  Skin: Skin is warm and dry. No pallor.  Psychiatric: She has a normal mood  and affect. Her speech is normal and behavior is normal.   BP 120/80   Pulse 82   Temp 98 F (36.7 C) (Oral)   Resp 16   Wt 149 lb 12.8 oz (67.9 kg)   SpO2 98%   BMI 27.40 kg/m       Assessment & Plan:  Peripheral edema - Plan: Basic metabolic panel  Essential hypertension - Plan: Basic metabolic panel  Shortness of breath  Smoker  Edema has improved and she seems to be improving overall.  BP is within goal range. Will check BMP since she is taking Lasix. Will have her continue on current medications and follow up with cardiologist next Monday as scheduled. She will watch her sodium intake. Counseled on healthier diet as she and her daughter admit that the patient's diet is very unhealthy.  She is hemodynamically stable.  Counseled on smoking cessation. Follow up pending labs.  Encouraged her to return in the next 1-2 months for a fasting CPE. She is deficient in several areas of health maintenance.

## 2016-10-25 NOTE — Patient Instructions (Addendum)
Stop smoking.   Cut back on salt.   Hydrate and eat more fruits, vegetables and lean meats. Cut back on sodas and "junk" food.   Go to your cardiology appointment as scheduled next Monday June 4th at 1:45 pm  Dr. Richardson Dopp at [facility] on [address].  [phone] Address: [place] [place] [address]   Follow up with me in the next 1-2 months for a fasting physical exam. Fasting means nothing to eat or drink except water for at least 6 hours.

## 2016-10-29 ENCOUNTER — Ambulatory Visit: Payer: BLUE CROSS/BLUE SHIELD | Admitting: Physician Assistant

## 2016-11-08 NOTE — Progress Notes (Signed)
Cardiology Office Note    Date:  11/12/2016   ID:  [name], DOB Oct 27, 1960, MRN [id number]  PCP:  Girtha Rm, NP-C  Cardiologist:  New to Dr. Acie Fredrickson  Chief Complaint: elevated BNP and le edema  History of Present Illness:   Michelle [name] is a 56 y.o. female with PMH of HTN (not on any medication currently), CVA, seizure, cerebral aneurysm and HLD (not on any medications) and ongoing tobacco abuse referred by Girtha Rm, NP-C for evaluation of elevated BNP and edema.   Patient was recently seen by PCP for LE edema. BNP 369.6. CXR showed bibasilar atelectasis. Started on lasix with improved symptoms on follow up visit.  Here today for further evaluation. Her edema has been improved significantly. She complains of exertional dyspnea and chest pressure that relieved with rest. No associated diaphoresis or nausea. She denies N, dizziness, orthopnea, PND, syncope, melena or blood in her stool or urine. Currently smokes approximately 1/4-1/2 pack-a-day cigarette. She eats lots of salt. Grandmother had a MI at age 67.   Past Medical History:  Diagnosis Date  . ABDOMINAL PAIN RIGHT LOWER QUADRANT 07/26/2009  . Abdominal pain, left lower quadrant 07/26/2009  . ABDOMINAL PAIN, LOWER 11/29/2009  . ANEMIA-B12 DEFICIENCY 07/11/2009  . ANEMIA-IRON DEFICIENCY 06/21/2009  . ANXIETY 02/06/2007  . BACK PAIN 07/01/2007  . Benzodiazepine overdose 12/03/2011   July 2013  . CHEST PAIN 06/21/2009  . DEPRESSION 02/06/2007  . [place] DISEASE, CERVICAL 05/03/2009  . DYSPEPSIA 10/23/2007  . EAR PAIN, RIGHT 11/29/2009  . ELBOW PAIN, RIGHT 06/27/2010  . ELEVATED BLOOD PRESSURE WITHOUT DIAGNOSIS OF HYPERTENSION 07/01/2007  . FEVER UNSPECIFIED 06/03/2008  . FREQUENCY, URINARY 03/12/2007  . Genital herpes 01/03/2011  . GERD 07/11/2009  . Glossitis 10/23/2007  . HEAD TRAUMA, CLOSED 11/29/2009  . History of surgery for cerebral aneurysm 2015  . HYPERLIPIDEMIA 02/06/2007  . Insomnia, unspecified 02/06/2007  .  LATERAL EPICONDYLITIS, LEFT 06/03/2008  . LOW BACK PAIN 02/06/2007  . Major depression 11/16/2010  . MIGRAINE HEADACHE 02/06/2007  . OSTEOARTHRITIS 02/06/2007  . 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 OTITIS EXTERNA, RIGHT 10/04/2009  . VITAMIN B12 DEFICIENCY 07/26/2009    Past Surgical History:  Procedure Laterality Date  . ABDOMINAL HYSTERECTOMY  2004  . APPENDECTOMY    . CEREBRAL ANEURYSM REPAIR    . right ankle surgury    . s/p c-spine fusion c 5-6      Current Medications: Prior to Admission medications   Medication Sig Start Date End Date Taking? Authorizing Provider  calcium carbonate (OS-CAL) 600 MG tablet Take 600 mg by mouth 2 (two) times daily.    [provider]  furosemide (LASIX) 20 MG tablet Take 1 tablet (20 mg total) by mouth daily. 10/17/16   Henson, Vickie L, NP-C  gabapentin (NEURONTIN) 300 MG capsule Take 300 mg by mouth 3 (three) times daily.    [provider]  PARoxetine (PAXIL) 30 MG tablet Take 30 mg by mouth daily.    [provider]  QUEtiapine (SEROQUEL) 200 MG tablet Take 200 mg by mouth at bedtime.    [provider]  venlafaxine XR (EFFEXOR-XR) 150 MG 24 hr capsule Take 300 mg by mouth daily.    [provider]    Allergies:   Citalopram; Diclofenac sodium; Fluoxetine hcl; and Ibuprofen   Social History   Social History  . Marital status: Widowed    Spouse name: N/A  . Number of children: N/A  .  Years of education: N/A   Occupational History  . 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 Smoking status: Current Every Day Smoker    Packs/day: 1.00    Years: 38.00    Types: Cigarettes  . Smokeless tobacco: Never Used  . Alcohol use No  . Drug use: No  . Sexual activity: Not on file   Other Topics Concern  . Not on file   Social History Narrative   Currently living apart from husband     Family History:  The patient's family history  includes Coronary artery disease (age of onset: 72) in her other; Diabetes in her other; Hypertension in her other.   ROS:   Please see the history of present illness.    ROS All other systems reviewed and are negative.   PHYSICAL EXAM:   VS:  BP 112/72   Pulse (!) 102   Ht 5\' 3"  (1.6 m)   Wt 148 lb (67.1 kg)   BMI 26.22 kg/m    GEN: Well nourished, well developed, in no acute distress  HEENT: normal  Neck: no JVD, carotid bruits, or masses Cardiac: RRR; no murmurs, rubs, or gallops, Trace edema  Respiratory:  clear to auscultation bilaterally, normal work of breathing GI: soft, nontender, nondistended, + BS MS: no deformity or atrophy  Skin: warm and dry, no rash Neuro:  Alert and Oriented x 3, Strength and sensation are intact Psych: euthymic mood, full affect  Wt Readings from Last 3 Encounters:  11/12/16 148 lb (67.1 kg)  10/25/16 149 lb 12.8 oz (67.9 kg)  10/16/16 153 lb (69.4 kg)      Studies/Labs Reviewed:   EKG:  EKG is ordered today.  The ekg ordered today demonstrates Sinus tachycardia at rate of 102 bpm. With T-wave inversion in inferior and anterior lateral lead. Somewhat more pronounced compared to EKG of 09-14-2014.  Recent Labs: 10/16/2016: ALT 12; Brain Natriuretic Peptide 369.6; Hemoglobin 11.0; Platelets 319 10/25/2016: BUN 12; Creat 0.74; Potassium 4.3; Sodium 142   Lipid Panel    Component Value Date/Time   CHOL 273 (H) 06/27/2010 0940   TRIG 95.0 06/27/2010 0940   HDL 66.50 06/27/2010 0940   CHOLHDL 4 06/27/2010 0940   VLDL 19.0 06/27/2010 0940   LDLCALC 99 06/14/2009 1249   LDLDIRECT 189.4 06/27/2010 0940    Additional studies/ records that were reviewed today include:   As above    ASSESSMENT & PLAN:    1. Exertional dyspnea with chest pressure - Her symptoms is concerning for angina. Relief with rest. Her cardiac risk factor includes hyperlipidemia, hypertension, CVA and ongoing tobacco abuse. EKG with nonspecific changes. Will get  echocardiogram and Lexiscan Myoview for further evaluation. Start ASA 81mg  qd.   2. Lower extremity edema - Edema has improved on Lasix. Advised to cut back on salt.  3. Hyperlipidemia - Lipid profile 01/2016 by PCP: LDL 157, triglyceride 404 and HDL 57. Start Lipitor 40 mg daily. Lipid panel and LFTs in 4-6 week  4.Hypertension - Stable and well controlled on Lasix only.  5. Tobacco abuse -Advised smoking cessation. Education given. She does not need any medication help.   The patient was seen and examined by Dr. Acie Fredrickson  Medication Adjustments/Labs and Tests Ordered: Current medicines are reviewed at length with the patient today.  Concerns regarding medicines are outlined above.  Medication changes, Labs and Tests ordered today are listed in the Patient Instructions below. Patient Instructions  Medication Instructions:   START TAKING  ASPIRIN 81 MG ONCE  A DAY   START TAKING  LIPITOR 40 MG ONCE A DAY    If you need a refill on your cardiac medications before your next appointment, please call your pharmacy.  Labwork: Your physician has requested that you have a lexiscan myoview. For further information please visit [email or web address]. Please follow instruction sheet, as given.  Your physician has requested that you have an echocardiogram. Echocardiography is a painless test that uses sound waves to create images of your heart. It provides your doctor with information about the size and shape of your heart and how well your heart's chambers and valves are working. This procedure takes approximately one hour. There are no restrictions for this procedure.  Testing/Procedures:   Follow-Up:  WITH [facility] [place] AFTER PROCEDURES    Any Other Special Instructions Will Be Listed Below (If Applicable).                                                                                                                                                      Jarrett Soho, [place]    11/12/2016 3:26 PM    [facility] [address], [place], [place]  [address] Phone: [phone]; Fax: [phone]    Attending Note:   The patient was seen and examined.  Agree with assessment and plan as noted above.  Changes made to the above note as needed.  Patient seen and independently examined with Robbie Lis, PA .   We discussed all aspects of the encounter. I agree with the assessment and plan as stated above.  1.   Chest pain :   Some aspects of her chest discomfort are concerning for angina. She continues to smoke. We advised her to stop smoking as continued smoking will contribute  to coronary artery disease. We'll schedule a stress Myoview.  2.  Leg edema :   This may be due to COPD or CHF She still smokes.   Better on Lasix.  3.  Cigarette smoking:    I counseled her about smoking cessation-  she became upset that I mentioned it.      I have spent a total of 40 minutes with patient reviewing hospital  notes , telemetry, EKGs, labs and examining patient as well as establishing an assessment and plan that was discussed with the patient. > 50% of time was spent in direct patient care.    Thayer Headings, Brooke Bonito., MD, [facility] 11/15/2016, 8:48 AM [address]  [place] Pager [phone]

## 2016-11-12 ENCOUNTER — Encounter: Payer: Self-pay | Admitting: Physician Assistant

## 2016-11-12 ENCOUNTER — Ambulatory Visit (INDEPENDENT_AMBULATORY_CARE_PROVIDER_SITE_OTHER): Payer: BLUE CROSS/BLUE SHIELD | Admitting: Physician Assistant

## 2016-11-12 VITALS — BP 112/72 | HR 102 | Ht 63.0 in | Wt 148.0 lb

## 2016-11-12 DIAGNOSIS — R0609 Other forms of dyspnea: Secondary | ICD-10-CM | POA: Diagnosis not present

## 2016-11-12 DIAGNOSIS — R0602 Shortness of breath: Secondary | ICD-10-CM

## 2016-11-12 DIAGNOSIS — E782 Mixed hyperlipidemia: Secondary | ICD-10-CM | POA: Diagnosis not present

## 2016-11-12 DIAGNOSIS — I10 Essential (primary) hypertension: Secondary | ICD-10-CM | POA: Diagnosis not present

## 2016-11-12 DIAGNOSIS — R079 Chest pain, unspecified: Secondary | ICD-10-CM | POA: Diagnosis not present

## 2016-11-12 DIAGNOSIS — R9431 Abnormal electrocardiogram [ECG] [EKG]: Secondary | ICD-10-CM

## 2016-11-12 DIAGNOSIS — Z720 Tobacco use: Secondary | ICD-10-CM

## 2016-11-12 MED ORDER — ATORVASTATIN CALCIUM 40 MG PO TABS: 40.0000 mg | ORAL_TABLET | Freq: Every day | ORAL | 3 refills | Status: DC

## 2016-11-12 NOTE — Patient Instructions (Addendum)
Medication Instructions:   START TAKING  ASPIRIN 81 MG ONCE  A DAY   START TAKING  LIPITOR 40 MG ONCE A DAY    If you need a refill on your cardiac medications before your next appointment, please call your pharmacy.  Labwork: Your physician has requested that you have a lexiscan myoview. For further information please visit [email or web address]. Please follow instruction sheet, as given.  Your physician has requested that you have an echocardiogram. Echocardiography is a painless test that uses sound waves to create images of your heart. It provides your doctor with information about the size and shape of your heart and how well your heart's chambers and valves are working. This procedure takes approximately one hour. There are no restrictions for this procedure.  Testing/Procedures:   Follow-Up:  WITH [facility] AFTER PROCEDURES    Any Other Special Instructions Will Be Listed Below (If Applicable).

## 2016-11-14 ENCOUNTER — Other Ambulatory Visit: Payer: Self-pay | Admitting: Family Medicine

## 2016-11-14 DIAGNOSIS — R7989 Other specified abnormal findings of blood chemistry: Secondary | ICD-10-CM

## 2016-11-14 DIAGNOSIS — R609 Edema, unspecified: Secondary | ICD-10-CM

## 2016-11-14 NOTE — Telephone Encounter (Signed)
Is this okay to refill? 

## 2016-11-14 NOTE — Telephone Encounter (Signed)
Ok

## 2016-12-03 ENCOUNTER — Telehealth (HOSPITAL_COMMUNITY): Payer: Self-pay | Admitting: *Deleted

## 2016-12-03 NOTE — Telephone Encounter (Signed)
Patient given detailed instructions per Myocardial Perfusion Study Information Sheet for the test on 12/06/16. Patient notified to arrive 15 minutes early and that it is imperative to arrive on time for appointment to keep from having the test rescheduled.  If you need to cancel or reschedule your appointment, please call the office within 24 hours of your appointment. . Patient verbalized understanding. Kirstie Peri

## 2016-12-05 ENCOUNTER — Encounter: Payer: Self-pay | Admitting: Family Medicine

## 2016-12-05 ENCOUNTER — Ambulatory Visit (INDEPENDENT_AMBULATORY_CARE_PROVIDER_SITE_OTHER): Payer: BLUE CROSS/BLUE SHIELD | Admitting: Family Medicine

## 2016-12-05 VITALS — BP 120/80 | HR 94 | Resp 16 | Ht 61.25 in | Wt 159.0 lb

## 2016-12-05 DIAGNOSIS — Z1211 Encounter for screening for malignant neoplasm of colon: Secondary | ICD-10-CM

## 2016-12-05 DIAGNOSIS — R600 Localized edema: Secondary | ICD-10-CM | POA: Diagnosis not present

## 2016-12-05 DIAGNOSIS — F32A Depression, unspecified: Secondary | ICD-10-CM

## 2016-12-05 DIAGNOSIS — Z1239 Encounter for other screening for malignant neoplasm of breast: Secondary | ICD-10-CM

## 2016-12-05 DIAGNOSIS — K219 Gastro-esophageal reflux disease without esophagitis: Secondary | ICD-10-CM | POA: Diagnosis not present

## 2016-12-05 DIAGNOSIS — E559 Vitamin D deficiency, unspecified: Secondary | ICD-10-CM

## 2016-12-05 DIAGNOSIS — D509 Iron deficiency anemia, unspecified: Secondary | ICD-10-CM

## 2016-12-05 DIAGNOSIS — Z1231 Encounter for screening mammogram for malignant neoplasm of breast: Secondary | ICD-10-CM

## 2016-12-05 DIAGNOSIS — E785 Hyperlipidemia, unspecified: Secondary | ICD-10-CM

## 2016-12-05 DIAGNOSIS — Z0000 Encounter for general adult medical examination without abnormal findings: Secondary | ICD-10-CM | POA: Diagnosis not present

## 2016-12-05 DIAGNOSIS — D518 Other vitamin B12 deficiency anemias: Secondary | ICD-10-CM | POA: Diagnosis not present

## 2016-12-05 DIAGNOSIS — R5383 Other fatigue: Secondary | ICD-10-CM

## 2016-12-05 DIAGNOSIS — Z23 Encounter for immunization: Secondary | ICD-10-CM

## 2016-12-05 DIAGNOSIS — E2839 Other primary ovarian failure: Secondary | ICD-10-CM

## 2016-12-05 DIAGNOSIS — F329 Major depressive disorder, single episode, unspecified: Secondary | ICD-10-CM

## 2016-12-05 DIAGNOSIS — I10 Essential (primary) hypertension: Secondary | ICD-10-CM

## 2016-12-05 DIAGNOSIS — K5900 Constipation, unspecified: Secondary | ICD-10-CM

## 2016-12-05 DIAGNOSIS — Z87891 Personal history of nicotine dependence: Secondary | ICD-10-CM

## 2016-12-05 LAB — POCT URINALYSIS DIP (PROADVANTAGE DEVICE)
Bilirubin, UA: NEGATIVE
Blood, UA: NEGATIVE
GLUCOSE UA: NEGATIVE mg/dL
Ketones, POC UA: NEGATIVE mg/dL
LEUKOCYTES UA: NEGATIVE
Nitrite, UA: NEGATIVE
PROTEIN UA: NEGATIVE mg/dL
SPECIFIC GRAVITY, URINE: 1.015
UUROB: NEGATIVE
pH, UA: 6.5 (ref 5.0–8.0)

## 2016-12-05 MED ORDER — POLYETHYLENE GLYCOL 3350 17 GM/SCOOP PO POWD: 17.0000 g | Freq: Two times a day (BID) | ORAL | 1 refills | Status: DC | PRN

## 2016-12-05 MED ORDER — RANITIDINE HCL 150 MG PO TABS: 150.0000 mg | ORAL_TABLET | Freq: Two times a day (BID) | ORAL | 1 refills | Status: DC

## 2016-12-05 NOTE — Patient Instructions (Addendum)
Congratulations on stopping smoking. Cut back on salty foods and do not add salt to your food.   Consider filling out advance directives such as health care power of attorney and living will.  Take the Miralax 1-2 cap fulls per day and increase water intake. You can also take stool softeners but I would stop taking laxatives on a regular basis.  Call and schedule your mammogram and bone density test.  The GI office will contact you to schedule an appointment to discuss colonoscopy, GERD and constipation.   See your cardiologist as scheduled. They will check you fasting cholesterol as discussed.   I am checking other blood work today and we will call you with results.   You received your Tdap and pneumonia vaccines today.   Call and schedule a dental exam.   Continue seeing your psychiatrist.    Preventative Care for Adults - Female      [place]:  A routine yearly physical is a good way to check in with your primary care provider about your health and preventive screening. It is also an opportunity to share updates about your health and any concerns you have, and receive a thorough all-over exam.   Most health insurance companies pay for at least some preventative services.  Check with your health plan for specific coverages.  WHAT PREVENTATIVE SERVICES DO WOMEN NEED?  Adult women should have their weight and blood pressure checked regularly.   Women age 90 and older should have their cholesterol levels checked regularly.  Women should be screened for cervical cancer with a Pap smear and pelvic exam beginning at either age 42, or 3 years after they become sexually activity.    Breast cancer screening generally begins at age 15 with a mammogram and breast exam by your primary care provider.    Beginning at age 56 and continuing to age 11, women should be screened for colorectal cancer.  Certain people may need continued testing until age 35.  Updating  vaccinations is part of preventative care.  Vaccinations help protect against diseases such as the flu.  Osteoporosis is a disease in which the bones lose minerals and strength as we age. Women ages 34 and over should discuss this with their caregivers, as should women after menopause who have other risk factors.  Lab tests are generally done as part of preventative care to screen for anemia and blood disorders, to screen for problems with the kidneys and liver, to screen for bladder problems, to check blood sugar, and to check your cholesterol level.  Preventative services generally include counseling about diet, exercise, avoiding tobacco, drugs, excessive alcohol consumption, and sexually transmitted infections.    GENERAL RECOMMENDATIONS FOR GOOD HEALTH:  Healthy diet:  Eat a variety of foods, including fruit, vegetables, animal or vegetable protein, such as meat, fish, chicken, and eggs, or beans, lentils, tofu, and grains, such as rice.  Drink plenty of water daily.  Decrease saturated fat in the diet, avoid lots of red meat, processed foods, sweets, fast foods, and fried foods.  Exercise:  Aerobic exercise helps maintain good heart health. At least 30-40 minutes of moderate-intensity exercise is recommended. For example, a brisk walk that increases your heart rate and breathing. This should be done on most days of the week.   Find a type of exercise or a variety of exercises that you enjoy so that it becomes a part of your daily life.  Examples are running, walking, swimming, water aerobics, and  biking.  For motivation and support, explore group exercise such as aerobic class, spin class, Zumba, Yoga,or  martial arts, etc.    Set exercise goals for yourself, such as a certain weight goal, walk or run in a race such as a 5k walk/run.  Speak to your primary care provider about exercise goals.  Disease prevention:  If you smoke or chew tobacco, find out from your caregiver how to  quit. It can literally save your life, no matter how long you have been a tobacco user. If you do not use tobacco, never begin.   Maintain a healthy diet and normal weight. Increased weight leads to problems with blood pressure and diabetes.   The Body Mass Index or BMI is a way of measuring how much of your body is fat. Having a BMI above 27 increases the risk of heart disease, diabetes, hypertension, stroke and other problems related to obesity. Your caregiver can help determine your BMI and based on it develop an exercise and dietary program to help you achieve or maintain this important measurement at a healthful level.  High blood pressure causes heart and blood vessel problems.  Persistent high blood pressure should be treated with medicine if weight loss and exercise do not work.   Fat and cholesterol leaves deposits in your arteries that can block them. This causes heart disease and vessel disease elsewhere in your body.  If your cholesterol is found to be high, or if you have heart disease or certain other medical conditions, then you may need to have your cholesterol monitored frequently and be treated with medication.   Ask if you should have a cardiac stress test if your history suggests this. A stress test is a test done on a treadmill that looks for heart disease. This test can find disease prior to there being a problem.  Menopause can be associated with physical symptoms and risks. Hormone replacement therapy is available to decrease these. You should talk to your caregiver about whether starting or continuing to take hormones is right for you.   Osteoporosis is a disease in which the bones lose minerals and strength as we age. This can result in serious bone fractures. Risk of osteoporosis can be identified using a bone density scan. Women ages 53 and over should discuss this with their caregivers, as should women after menopause who have other risk factors. Ask your caregiver whether  you should be taking a calcium supplement and Vitamin D, to reduce the rate of osteoporosis.   Avoid drinking alcohol in excess (more than two drinks per day).  Avoid use of street drugs. Do not share needles with anyone. Ask for professional help if you need assistance or instructions on stopping the use of alcohol, cigarettes, and/or drugs.  Brush your teeth twice a day with fluoride toothpaste, and floss once a day. Good oral hygiene prevents tooth decay and gum disease. The problems can be painful, unattractive, and can cause other health problems. Visit your dentist for a routine oral and dental check up and preventive care every 6-12 months.   Look at your skin regularly.  Use a mirror to look at your back. Notify your caregivers of changes in moles, especially if there are changes in shapes, colors, a size larger than a pencil eraser, an irregular border, or development of new moles.  Safety:  Use seatbelts 100% of the time, whether driving or as a passenger.  Use safety devices such as hearing protection if you work  in environments with loud noise or significant background noise.  Use safety glasses when doing any work that could send debris in to the eyes.  Use a helmet if you ride a bike or motorcycle.  Use appropriate safety gear for contact sports.  Talk to your caregiver about gun safety.  Use sunscreen with a SPF (or skin protection factor) of 15 or greater.  Lighter skinned people are at a greater risk of skin cancer. Don't forget to also wear sunglasses in order to protect your eyes from too much damaging sunlight. Damaging sunlight can accelerate cataract formation.   Practice safe sex. Use condoms. Condoms are used for birth control and to help reduce the spread of sexually transmitted infections (or STIs).  Some of the STIs are gonorrhea (the clap), chlamydia, syphilis, trichomonas, herpes, HPV (human papilloma virus) and HIV (human immunodeficiency virus) which causes AIDS. The  herpes, HIV and HPV are viral illnesses that have no cure. These can result in disability, cancer and death.   Keep carbon monoxide and smoke detectors in your home functioning at all times. Change the batteries every 6 months or use a model that plugs into the wall.   Vaccinations:  Stay up to date with your tetanus shots and other required immunizations. You should have a booster for tetanus every 10 years. Be sure to get your flu shot every year, since 5%-20% of the [place] population comes down with the flu. The flu vaccine changes each year, so being vaccinated once is not enough. Get your shot in the fall, before the flu season peaks.   Other vaccines to consider:  Human Papilloma Virus or HPV causes cancer of the cervix, and other infections that can be transmitted from person to person. There is a vaccine for HPV, and females should get immunized between the ages of 15 and 17. It requires a series of 3 shots.   Pneumococcal vaccine to protect against certain types of pneumonia.  This is normally recommended for adults age 70 or older.  However, adults younger than 57 years old with certain underlying conditions such as diabetes, heart or lung disease should also receive the vaccine.  Shingles vaccine to protect against Varicella Zoster if you are older than age 47, or younger than 57 years old with certain underlying illness.  Hepatitis A vaccine to protect against a form of infection of the liver by a virus acquired from food.  Hepatitis B vaccine to protect against a form of infection of the liver by a virus acquired from blood or body fluids, particularly if you work in health care.  If you plan to travel internationally, check with your local health department for specific vaccination recommendations.  Cancer Screening:  Breast cancer screening is essential to preventive care for women. All women age 81 and older should perform a breast self-exam every month. At age 58 and older,  women should have their caregiver complete a breast exam each year. Women at ages 57 and older should have a mammogram (x-ray film) of the breasts. Your caregiver can discuss how often you need mammograms.    Cervical cancer screening includes taking a Pap smear (sample of cells examined under a microscope) from the cervix (end of the uterus). It also includes testing for HPV (Human Papilloma Virus, which can cause cervical cancer). Screening and a pelvic exam should begin at age 8, or 3 years after a woman becomes sexually active. Screening should occur every year, with a Pap smear but no  HPV testing, up to age 43. After age 18, you should have a Pap smear every 3 years with HPV testing, if no HPV was found previously.   Most routine colon cancer screening begins at the age of 51. On a yearly basis, doctors may provide special easy to use take-home tests to check for hidden blood in the stool. Sigmoidoscopy or colonoscopy can detect the earliest forms of colon cancer and is life saving. These tests use a small camera at the end of a tube to directly examine the colon. Speak to your caregiver about this at age 24, when routine screening begins (and is repeated every 5 years unless early forms of pre-cancerous polyps or small growths are found).

## 2016-12-05 NOTE — Progress Notes (Signed)
Subjective:    Patient ID: Michelle [name], female    DOB: 01-13-1961, 56 y.o.   MRN: [id number]  HPI     Chief Complaint  Patient presents with  . fasting cpe    fasting cpe. 10 pound weight gain since last visit. quit smoking   She is here for a complete physical exam and follow up on chronic health conditions.  She has a history of HTN and is not currently on medication.  History of seizure and cerebral aneurysm repair.   Other providers: Dr. Acie Fredrickson and [place] [facility]. Dr. Ernestina Patches- pain management. Dr. Erlinda Hong orthopedist. She goes to the [facility] for depression.   She saw her cardiologist last month for chest pain, peripheral edema and elevated BNP. She is scheduled for an echocardiogram and stress test tomorrow. She started taking a daily aspirin 81 mg. States she no longer has chest pain. States DOE has improved. She is taking Lasix for edema.  Advised to eat a low salt diet for LE edema.   Hyperlipidemia- started on atorvastatin. She is scheduled to have her lipids checked at her cardiologist in a week or two.   GERD- takes tums. She eats and lays down. States she does not avoid foods that make her reflux worse. Would like help with this.   States she stopped smoking 3 weeks ago and has gained weight. She is concerned about the weight gain.  States her clothes are feeling tighter.   Her psychiatrist is managing her depression and anxiety. She is taking Paxil.   History of vitamin D def and IDA and B12.   Social history: Lives alone in an apartment, no steps. Has 2 children. Does not work. She has done textile work in the past. She completed the 11 th grade.   Diet: has been high in salt and she has not changed.  Excerise: has been walking 6 days per week.   Immunizations: Td 2011  Health maintenance:  Mammogram: 2 years ago in [place], [place].  Colonoscopy: 2011 - it was recommended that she Last Gynecological Exam: years ago.  Last Menstrual cycle:  hysterectomy in 2008 for heavy bleeding.  Last Dental Exam: years and does not have one here.  Last Eye Exam: years ago. Has prescription eyeglasses. Does not have an eye doctor here.   Depression screen [facility] 2/9 12/05/2016 07/17/2016  Decreased Interest 0 2  Down, Depressed, Hopeless 0 3  PHQ - 2 Score 0 5  Altered sleeping - 2  Tired, decreased energy - 3  Change in appetite - 2  Feeling bad or failure about yourself  - 3  Trouble concentrating - 3  Moving slowly or fidgety/restless - 3  Suicidal thoughts - 0  PHQ-9 Score - 21    Wears seatbelt always, smoke detectors in home and functioning, does not drive and feels safe in home environment.   Reviewed allergies, medications, past medical, surgical, family, and social history.   Review of Systems Review of Systems Constitutional: -fever, -chills, -sweats, +unexpected weight change,-fatigue ENT: -runny nose, -ear pain, -sore throat Cardiology:  -chest pain, -palpitations, -edema Respiratory: + mild cough, +shortness of breath, -wheezing Gastroenterology: -abdominal pain, -nausea, -vomiting, -diarrhea, +constipation  Hematology: -bleeding or bruising problems Musculoskeletal: -arthralgias, -myalgias, -joint swelling, -back pain Ophthalmology: -vision changes Urology: -dysuria, -difficulty urinating, -hematuria, -urinary frequency, -urgency Neurology: -headache, -weakness, -tingling, -numbness       Objective:   Physical Exam BP 120/80   Pulse 94   Resp 16  Ht 5' 1.25" (1.556 m)   Wt 159 lb (72.1 kg)   SpO2 98%   BMI 29.80 kg/m   General Appearance:    Alert, cooperative, no distress, appears stated age  Head:    Normocephalic, without obvious abnormality, atraumatic  Eyes:    PERRL, conjunctiva/corneas clear, EOM's intact, fundi    benign  Ears:    Normal TM's and external ear canals  Nose:   Nares normal, mucosa normal, no drainage or sinus   tenderness  Throat:   Lips, mucosa, and tongue normal; teeth  and gums normal  Neck:   Supple, no lymphadenopathy;  thyroid:  no   enlargement/tenderness/nodules; no carotid   bruit or JVD  Back:    Spine nontender, no curvature, ROM normal, no CVA     tenderness  Lungs:     Clear to auscultation bilaterally without wheezes, rales or     ronchi; respirations unlabored  Chest Wall:    No tenderness or deformity   Heart:    Regular rate and rhythm, S1 and S2 normal, no murmur, rub   or gallop  Breast Exam:    No tenderness, masses, or nipple discharge or inversion.      No axillary lymphadenopathy  Abdomen:     Soft, non-tender, nondistended, normoactive bowel sounds,    no masses, no hepatosplenomegaly  Genitalia:    Normal external genitalia without lesions.  BUS and vagina normal. No cervix-previous hysterectomy.  No abnormal vaginal discharge.   adnexa not enlarged, nontender, no masses.      Extremities:   No clubbing, cyanosis. 1+ pitting edema to bilateral LE. Normal pulses, cap refill and sensation testing (monofilament) done with normal result.   Pulses:   2+ and symmetric all extremities  Skin:   Skin color, texture, turgor normal, no rashes or lesions  Lymph nodes:   Cervical, supraclavicular, and axillary nodes normal  Neurologic:   CNII-XII intact, normal strength, sensation and gait; reflexes 2+ and symmetric throughout                                Psych:   Normal mood, affect, hygiene and grooming.                 Urinalysis dipstick: spec grav 1.015, neg      Assessment & Plan:  Iron deficiency anemia, unspecified iron deficiency anemia type - Plan: CBC with Differential/Platelet  Routine general medical examination at a health care facility - Plan: POCT Urinalysis DIP (Proadvantage Device), CBC with Differential/Platelet, COMPLETE METABOLIC PANEL WITH GFR, TSH  Essential hypertension - Plan: CBC with Differential/Platelet, COMPLETE METABOLIC PANEL WITH GFR  Depression, prolonged  Hyperlipidemia, unspecified  hyperlipidemia type - Plan: CANCELED: PSA  ANEMIA-B12 DEFICIENCY - Plan: CBC with Differential/Platelet, Vitamin B12  Gastroesophageal reflux disease, esophagitis presence not specified - Plan: Ambulatory referral to Gastroenterology, ranitidine (ZANTAC) 150 MG tablet  Estrogen deficiency - Plan: DG Bone Density  Screen for colon cancer - Plan: Ambulatory referral to Gastroenterology  Screening for breast cancer - Plan: MM DIGITAL SCREENING BILATERAL  Vitamin D deficiency - Plan: VITAMIN D 25 Hydroxy (Vit-D Deficiency, Fractures)  Fatigue, unspecified type - Plan: CBC with Differential/Platelet, COMPLETE METABOLIC PANEL WITH GFR, TSH, VITAMIN D 25 Hydroxy (Vit-D Deficiency, Fractures), Vitamin B12  Need for vaccination against Streptococcus pneumoniae - Plan: Pneumococcal conjugate vaccine 13-valent IM  Former smoker  Need for Tdap vaccination - Plan: Tdap vaccine  greater than or equal to 7yo IM  Constipation, unspecified constipation type - Plan: Ambulatory referral to Gastroenterology, polyethylene glycol powder (GLYCOLAX/MIRALAX) powder  Bilateral leg edema  Discussed that her BP is at goal range today. She has not taken medication in years for BP. She does eat a salty diet and has been having LE edema. She is being worked up by her cardiologist for chest pain, DOE, LE edema. She has a stress test and echo scheduled for this week.  She will continue taking Lasix for now until discussed with cardiologist.  Chest pain appears to have resolved but she continues having DOE but she reports this has improved somewhat. LE edema is also somewhat improved per patient.  She has experienced a weight gain and feels like her clothing is tighter. Denies changing her diet.  Tdap given. Previous Td was in 2011.  Prevnar 13 given.  Mammogram and bone density ordered and she is aware that she needs to call and schedule these.  Congratulated her on stopping smoking. She does not meet  criteria for CT screening for lung cancer.  She is overdue for repeat colonoscopy per records. She will contact them to schedule this. She will also discuss chronic constipation and GERD.  Counseled her on both GERD and constipation today.  She will try Ranitidine twice daily and this was sent to her pharmacy. Also counseled on lifestyle management to prevent symptoms such as avoiding food triggers, not eating large meals and not eating for at least 3 hours before laying down.  Will have her try Miralax and stool softeners and stop laxatives.  No sign of depression today. She will continue seeing her psychiatrist.  She appears to be stable on all current medication without any obvious adverse effects.  Follow up pending labs.

## 2016-12-06 ENCOUNTER — Ambulatory Visit (HOSPITAL_COMMUNITY): Payer: BLUE CROSS/BLUE SHIELD | Attending: Physician Assistant

## 2016-12-06 DIAGNOSIS — R0609 Other forms of dyspnea: Secondary | ICD-10-CM

## 2016-12-06 DIAGNOSIS — R079 Chest pain, unspecified: Secondary | ICD-10-CM | POA: Diagnosis not present

## 2016-12-06 DIAGNOSIS — I10 Essential (primary) hypertension: Secondary | ICD-10-CM | POA: Insufficient documentation

## 2016-12-06 DIAGNOSIS — R9431 Abnormal electrocardiogram [ECG] [EKG]: Secondary | ICD-10-CM

## 2016-12-06 DIAGNOSIS — R9439 Abnormal result of other cardiovascular function study: Secondary | ICD-10-CM | POA: Diagnosis not present

## 2016-12-06 DIAGNOSIS — I2510 Atherosclerotic heart disease of native coronary artery without angina pectoris: Secondary | ICD-10-CM | POA: Diagnosis present

## 2016-12-06 DIAGNOSIS — R0602 Shortness of breath: Secondary | ICD-10-CM

## 2016-12-06 LAB — CBC WITH DIFFERENTIAL/PLATELET
BASOS ABS: 0 {cells}/uL (ref 0–200)
Basophils Relative: 0 %
EOS PCT: 3 %
Eosinophils Absolute: 237 cells/uL (ref 15–500)
HEMATOCRIT: 38.5 % (ref 35.0–45.0)
HEMOGLOBIN: 12.2 g/dL (ref 11.7–15.5)
LYMPHS ABS: 2686 {cells}/uL (ref 850–3900)
Lymphocytes Relative: 34 %
MCH: 28.6 pg (ref 27.0–33.0)
MCHC: 31.7 g/dL — AB (ref 32.0–36.0)
MCV: 90.4 fL (ref 80.0–100.0)
MPV: 9.1 fL (ref 7.5–12.5)
Monocytes Absolute: 632 cells/uL (ref 200–950)
Monocytes Relative: 8 %
NEUTROS PCT: 55 %
Neutro Abs: 4345 cells/uL (ref 1500–7800)
Platelets: 394 10*3/uL (ref 140–400)
RBC: 4.26 MIL/uL (ref 3.80–5.10)
RDW: 14.9 % (ref 11.0–15.0)
WBC: 7.9 10*3/uL (ref 4.0–10.5)

## 2016-12-06 LAB — MYOCARDIAL PERFUSION IMAGING
CHL CUP NUCLEAR SDS: 10
CHL CUP NUCLEAR SRS: 4
CHL CUP RESTING HR STRESS: 91 {beats}/min
LHR: 0.35
LV dias vol: 80 mL (ref 46–106)
LV sys vol: 36 mL
NUC STRESS TID: 1.11
Peak HR: 102 {beats}/min
SSS: 14

## 2016-12-06 LAB — COMPLETE METABOLIC PANEL WITH GFR
ALBUMIN: 4.1 g/dL (ref 3.6–5.1)
ALK PHOS: 159 U/L — AB (ref 33–130)
ALT: 23 U/L (ref 6–29)
AST: 32 U/L (ref 10–35)
BILIRUBIN TOTAL: 0.3 mg/dL (ref 0.2–1.2)
BUN: 11 mg/dL (ref 7–25)
CALCIUM: 9.5 mg/dL (ref 8.6–10.4)
CO2: 24 mmol/L (ref 20–31)
Chloride: 102 mmol/L (ref 98–110)
Creat: 0.78 mg/dL (ref 0.50–1.05)
GFR, Est Non African American: 85 mL/min (ref >=60)
Glucose, Bld: 80 mg/dL (ref 65–99)
POTASSIUM: 4.2 mmol/L (ref 3.5–5.3)
SODIUM: 140 mmol/L (ref 135–146)
Total Protein: 6.9 g/dL (ref 6.1–8.1)

## 2016-12-06 LAB — TSH: TSH: 4.65 m[IU]/L — AB

## 2016-12-06 LAB — VITAMIN D 25 HYDROXY (VIT D DEFICIENCY, FRACTURES): Vit D, 25-Hydroxy: 29 ng/mL — ABNORMAL LOW (ref 30–100)

## 2016-12-06 LAB — VITAMIN B12: VITAMIN B 12: 228 pg/mL (ref 200–1100)

## 2016-12-06 MED ORDER — TECHNETIUM TC 99M TETROFOSMIN IV KIT
30.6000 | PACK | Freq: Once | INTRAVENOUS | Status: AC | PRN
  Administered 2016-12-06: 30.6 via INTRAVENOUS
  Filled 2016-12-06: qty 31

## 2016-12-06 MED ORDER — REGADENOSON 0.4 MG/5ML IV SOLN
0.4000 mg | Freq: Once | INTRAVENOUS | Status: AC
  Administered 2016-12-06: 0.4 mg via INTRAVENOUS

## 2016-12-06 MED ORDER — TECHNETIUM TC 99M TETROFOSMIN IV KIT
10.2000 | PACK | Freq: Once | INTRAVENOUS | Status: AC | PRN
  Administered 2016-12-06: 10.2 via INTRAVENOUS
  Filled 2016-12-06: qty 11

## 2016-12-11 ENCOUNTER — Telehealth: Payer: Self-pay | Admitting: *Deleted

## 2016-12-11 ENCOUNTER — Telehealth: Payer: Self-pay

## 2016-12-11 NOTE — Telephone Encounter (Signed)
Pt daughter, [place], [place] on file, has been made aware of pts cath set up for 12/12/16 with Dr. Irish Lack.  Instructions have been verbally given over the phone. She verbalized understanding.    @[email or web address] @  [place] OFFICE [address] [place] [address] [address] Dept: [phone] Loc: [phone]  AYLINE DINGUS  12/11/2016  You are scheduled for a Cardiac Catheterization on Wednesday, July 18 with Dr. Larae Grooms.  1. Please arrive at the [facility] (Main Entrance A) at [facility]: [place], [place] [address] at 9:00 AM (two hours before your procedure to ensure your preparation). Free valet parking service is available.   Special note: Every effort is made to have your procedure done on time. Please understand that emergencies sometimes delay scheduled procedures.  2. Diet: Do not eat or drink anything after midnight prior to your procedure except sips of water to take medications.  3. Labs: Your labs will be performed at the hospital after you arrive for your procedure.  4. Medication instructions in preparation for your procedure:   On the morning of your procedure, take your Aspirin and any morning medicines NOT listed above.  You may use sips of water.  5. Plan for one night stay--bring personal belongings. 6. Bring a current list of your medications and current insurance cards. 7. You MUST have a responsible person to drive you home. 8. Someone MUST be with you the first 24 hours after you arrive home or your discharge will be delayed. 9. Please wear clothes that are easy to get on and off and wear slip-on shoes.  Thank you for allowing [place] to care for you!   -- [facility]

## 2016-12-11 NOTE — Telephone Encounter (Signed)
-----   Message from [place], [place] sent at 12/09/2016  3:26 PM EDT ----- Abnormal stress test. Concern regarding inferior/inferolateral wall ischemia. I have tried to reach patient multiple times over weekend. However, no has picked up phone.   Please schedule for cath in next week or so. Continue ASA and statin. Recommended smoking cessation.   Cath risk factors are not limited to stroke (1 in 1000), death (1 in 1000), kidney failure [usually temporary] (1 in 500), bleeding (1 in 200), allergic reaction [possibly serious] (1 in 200). If she has further question, I can call back on Tuesday otherwise setup appointment with APP to discuss cath.

## 2016-12-11 NOTE — Telephone Encounter (Signed)
Attempted outreach to Pt for pre cath instruction.  Call went to line with no sound.  Unable to leave message.

## 2016-12-12 ENCOUNTER — Inpatient Hospital Stay (HOSPITAL_COMMUNITY)
Admission: AD | Admit: 2016-12-12 | Discharge: 2016-12-23 | DRG: 234 | Disposition: A | Discharge status: Home Health | Payer: BLUE CROSS/BLUE SHIELD | Source: Ambulatory Visit | Attending: Surgery | Admitting: Surgery

## 2016-12-12 ENCOUNTER — Encounter (HOSPITAL_COMMUNITY): Payer: Self-pay | Admitting: *Deleted

## 2016-12-12 ENCOUNTER — Inpatient Hospital Stay (HOSPITAL_COMMUNITY)
Admission: AD | Disposition: A | Discharge status: Home Health | Payer: Self-pay | Source: Ambulatory Visit | Attending: Surgery

## 2016-12-12 ENCOUNTER — Ambulatory Visit (HOSPITAL_BASED_OUTPATIENT_CLINIC_OR_DEPARTMENT_OTHER): Payer: BLUE CROSS/BLUE SHIELD

## 2016-12-12 ENCOUNTER — Other Ambulatory Visit: Payer: Self-pay

## 2016-12-12 DIAGNOSIS — I219 Acute myocardial infarction, unspecified: Secondary | ICD-10-CM

## 2016-12-12 DIAGNOSIS — R4189 Other symptoms and signs involving cognitive functions and awareness: Secondary | ICD-10-CM | POA: Diagnosis present

## 2016-12-12 DIAGNOSIS — Z781 Physical restraint status: Secondary | ICD-10-CM | POA: Diagnosis not present

## 2016-12-12 DIAGNOSIS — E876 Hypokalemia: Secondary | ICD-10-CM | POA: Diagnosis present

## 2016-12-12 DIAGNOSIS — Z01810 Encounter for preprocedural cardiovascular examination: Secondary | ICD-10-CM | POA: Diagnosis not present

## 2016-12-12 DIAGNOSIS — F17210 Nicotine dependence, cigarettes, uncomplicated: Secondary | ICD-10-CM | POA: Diagnosis present

## 2016-12-12 DIAGNOSIS — I509 Heart failure, unspecified: Secondary | ICD-10-CM | POA: Diagnosis not present

## 2016-12-12 DIAGNOSIS — Z8673 Personal history of transient ischemic attack (TIA), and cerebral infarction without residual deficits: Secondary | ICD-10-CM | POA: Diagnosis not present

## 2016-12-12 DIAGNOSIS — Z833 Family history of diabetes mellitus: Secondary | ICD-10-CM

## 2016-12-12 DIAGNOSIS — I25119 Atherosclerotic heart disease of native coronary artery with unspecified angina pectoris: Secondary | ICD-10-CM | POA: Diagnosis not present

## 2016-12-12 DIAGNOSIS — Y92230 Patient room in hospital as the place of occurrence of the external cause: Secondary | ICD-10-CM | POA: Diagnosis not present

## 2016-12-12 DIAGNOSIS — Z9119 Patient's noncompliance with other medical treatment and regimen: Secondary | ICD-10-CM

## 2016-12-12 DIAGNOSIS — E785 Hyperlipidemia, unspecified: Secondary | ICD-10-CM | POA: Diagnosis present

## 2016-12-12 DIAGNOSIS — F17200 Nicotine dependence, unspecified, uncomplicated: Secondary | ICD-10-CM

## 2016-12-12 DIAGNOSIS — R0609 Other forms of dyspnea: Secondary | ICD-10-CM | POA: Diagnosis not present

## 2016-12-12 DIAGNOSIS — G43909 Migraine, unspecified, not intractable, without status migrainosus: Secondary | ICD-10-CM | POA: Diagnosis present

## 2016-12-12 DIAGNOSIS — R600 Localized edema: Secondary | ICD-10-CM | POA: Diagnosis present

## 2016-12-12 DIAGNOSIS — M545 Low back pain: Secondary | ICD-10-CM | POA: Diagnosis present

## 2016-12-12 DIAGNOSIS — W19XXXA Unspecified fall, initial encounter: Secondary | ICD-10-CM

## 2016-12-12 DIAGNOSIS — I25118 Atherosclerotic heart disease of native coronary artery with other forms of angina pectoris: Secondary | ICD-10-CM | POA: Diagnosis not present

## 2016-12-12 DIAGNOSIS — Z951 Presence of aortocoronary bypass graft: Secondary | ICD-10-CM

## 2016-12-12 DIAGNOSIS — I10 Essential (primary) hypertension: Secondary | ICD-10-CM | POA: Diagnosis present

## 2016-12-12 DIAGNOSIS — R9431 Abnormal electrocardiogram [ECG] [EKG]: Secondary | ICD-10-CM | POA: Diagnosis not present

## 2016-12-12 DIAGNOSIS — W1830XA Fall on same level, unspecified, initial encounter: Secondary | ICD-10-CM | POA: Diagnosis not present

## 2016-12-12 DIAGNOSIS — I5032 Chronic diastolic (congestive) heart failure: Secondary | ICD-10-CM | POA: Diagnosis present

## 2016-12-12 DIAGNOSIS — Z981 Arthrodesis status: Secondary | ICD-10-CM

## 2016-12-12 DIAGNOSIS — Z720 Tobacco use: Secondary | ICD-10-CM | POA: Diagnosis not present

## 2016-12-12 DIAGNOSIS — Z79899 Other long term (current) drug therapy: Secondary | ICD-10-CM

## 2016-12-12 DIAGNOSIS — R0602 Shortness of breath: Secondary | ICD-10-CM

## 2016-12-12 DIAGNOSIS — Z9181 History of falling: Secondary | ICD-10-CM

## 2016-12-12 DIAGNOSIS — K219 Gastro-esophageal reflux disease without esophagitis: Secondary | ICD-10-CM | POA: Diagnosis present

## 2016-12-12 DIAGNOSIS — E538 Deficiency of other specified B group vitamins: Secondary | ICD-10-CM | POA: Diagnosis present

## 2016-12-12 DIAGNOSIS — S0083XA Contusion of other part of head, initial encounter: Secondary | ICD-10-CM | POA: Diagnosis not present

## 2016-12-12 DIAGNOSIS — F05 Delirium due to known physiological condition: Secondary | ICD-10-CM | POA: Diagnosis not present

## 2016-12-12 DIAGNOSIS — J939 Pneumothorax, unspecified: Secondary | ICD-10-CM

## 2016-12-12 DIAGNOSIS — I209 Angina pectoris, unspecified: Secondary | ICD-10-CM

## 2016-12-12 DIAGNOSIS — I4581 Long QT syndrome: Secondary | ICD-10-CM | POA: Diagnosis present

## 2016-12-12 DIAGNOSIS — M7712 Lateral epicondylitis, left elbow: Secondary | ICD-10-CM | POA: Diagnosis present

## 2016-12-12 DIAGNOSIS — I25110 Atherosclerotic heart disease of native coronary artery with unstable angina pectoris: Secondary | ICD-10-CM | POA: Diagnosis not present

## 2016-12-12 DIAGNOSIS — I110 Hypertensive heart disease with heart failure: Secondary | ICD-10-CM | POA: Diagnosis present

## 2016-12-12 DIAGNOSIS — R069 Unspecified abnormalities of breathing: Secondary | ICD-10-CM

## 2016-12-12 DIAGNOSIS — R000 Tachycardia, unspecified: Secondary | ICD-10-CM | POA: Diagnosis present

## 2016-12-12 DIAGNOSIS — F314 Bipolar disorder, current episode depressed, severe, without psychotic features: Secondary | ICD-10-CM | POA: Diagnosis present

## 2016-12-12 DIAGNOSIS — D62 Acute posthemorrhagic anemia: Secondary | ICD-10-CM | POA: Diagnosis not present

## 2016-12-12 DIAGNOSIS — M5090 Cervical disc disorder, unspecified, unspecified cervical region: Secondary | ICD-10-CM | POA: Diagnosis present

## 2016-12-12 DIAGNOSIS — J9811 Atelectasis: Secondary | ICD-10-CM | POA: Diagnosis present

## 2016-12-12 DIAGNOSIS — Z8249 Family history of ischemic heart disease and other diseases of the circulatory system: Secondary | ICD-10-CM

## 2016-12-12 DIAGNOSIS — Z801 Family history of malignant neoplasm of trachea, bronchus and lung: Secondary | ICD-10-CM

## 2016-12-12 DIAGNOSIS — Z7982 Long term (current) use of aspirin: Secondary | ICD-10-CM

## 2016-12-12 DIAGNOSIS — I5031 Acute diastolic (congestive) heart failure: Secondary | ICD-10-CM | POA: Diagnosis not present

## 2016-12-12 HISTORY — PX: CARDIAC CATHETERIZATION: SHX172

## 2016-12-12 HISTORY — DX: Cerebral infarction, unspecified: I63.9

## 2016-12-12 HISTORY — PX: LEFT HEART CATH AND CORONARY ANGIOGRAPHY: CATH118249

## 2016-12-12 HISTORY — DX: Unspecified osteoarthritis, unspecified site: M19.90

## 2016-12-12 HISTORY — DX: Atherosclerotic heart disease of native coronary artery without angina pectoris: I25.10

## 2016-12-12 LAB — PROTIME-INR
INR: 0.94
PROTHROMBIN TIME: 12.5 s (ref 11.4–15.2)

## 2016-12-12 SURGERY — LEFT HEART CATH AND CORONARY ANGIOGRAPHY
Anesthesia: LOCAL

## 2016-12-12 MED ORDER — FAMOTIDINE 20 MG PO TABS
20.0000 mg | ORAL_TABLET | Freq: Every day | ORAL | Status: DC
  Administered 2016-12-12 – 2016-12-16 (×5): 20 mg via ORAL
  Filled 2016-12-12 (×5): qty 1

## 2016-12-12 MED ORDER — ADULT MULTIVITAMIN W/MINERALS CH
1.0000 | ORAL_TABLET | Freq: Every day | ORAL | Status: DC
  Administered 2016-12-13 – 2016-12-16 (×4): 1 via ORAL
  Filled 2016-12-12 (×4): qty 1

## 2016-12-12 MED ORDER — QUETIAPINE FUMARATE 200 MG PO TABS
200.0000 mg | ORAL_TABLET | Freq: Three times a day (TID) | ORAL | Status: DC
  Filled 2016-12-12: qty 1
  Filled 2016-12-12: qty 4

## 2016-12-12 MED ORDER — SODIUM CHLORIDE 0.9% FLUSH: 3.0000 mL | INTRAVENOUS | Status: DC | PRN

## 2016-12-12 MED ORDER — HEPARIN SODIUM (PORCINE) 1000 UNIT/ML IJ SOLN
INTRAMUSCULAR | Status: DC | PRN
  Administered 2016-12-12: 3500 [IU] via INTRAVENOUS

## 2016-12-12 MED ORDER — SODIUM CHLORIDE 0.9 % WEIGHT BASED INFUSION
3.0000 mL/kg/h | INTRAVENOUS | Status: DC
  Administered 2016-12-12: 3 mL/kg/h via INTRAVENOUS

## 2016-12-12 MED ORDER — SODIUM CHLORIDE 0.9 % IV SOLN: 250.0000 mL | INTRAVENOUS | Status: DC | PRN

## 2016-12-12 MED ORDER — ATORVASTATIN CALCIUM 40 MG PO TABS
40.0000 mg | ORAL_TABLET | Freq: Every day | ORAL | Status: DC
  Administered 2016-12-12 – 2016-12-16 (×5): 40 mg via ORAL
  Filled 2016-12-12 (×5): qty 1

## 2016-12-12 MED ORDER — IOPAMIDOL (ISOVUE-370) INJECTION 76%
INTRAVENOUS | Status: DC | PRN
  Administered 2016-12-12: 50 mL via INTRA_ARTERIAL

## 2016-12-12 MED ORDER — SODIUM CHLORIDE 0.9% FLUSH: 3.0000 mL | Freq: Two times a day (BID) | INTRAVENOUS | Status: DC

## 2016-12-12 MED ORDER — POLYETHYLENE GLYCOL 3350 17 GM/SCOOP PO POWD
17.0000 g | Freq: Every day | ORAL | Status: DC
  Filled 2016-12-12: qty 255

## 2016-12-12 MED ORDER — VENLAFAXINE HCL ER 150 MG PO CP24
300.0000 mg | ORAL_CAPSULE | Freq: Every day | ORAL | Status: DC
  Administered 2016-12-13 – 2016-12-16 (×4): 300 mg via ORAL
  Filled 2016-12-12 (×6): qty 2

## 2016-12-12 MED ORDER — SODIUM CHLORIDE 0.9 % IV SOLN: INTRAVENOUS | Status: AC

## 2016-12-12 MED ORDER — MIDAZOLAM HCL 2 MG/2ML IJ SOLN
INTRAMUSCULAR | Status: DC | PRN
  Administered 2016-12-12: 2 mg via INTRAVENOUS

## 2016-12-12 MED ORDER — FENTANYL CITRATE (PF) 100 MCG/2ML IJ SOLN
INTRAMUSCULAR | Status: AC
  Filled 2016-12-12: qty 2

## 2016-12-12 MED ORDER — VERAPAMIL HCL 2.5 MG/ML IV SOLN
INTRAVENOUS | Status: DC | PRN
  Administered 2016-12-12: 10 mL via INTRA_ARTERIAL

## 2016-12-12 MED ORDER — LIDOCAINE HCL (PF) 1 % IJ SOLN
INTRAMUSCULAR | Status: AC
  Filled 2016-12-12: qty 30

## 2016-12-12 MED ORDER — VERAPAMIL HCL 2.5 MG/ML IV SOLN
INTRAVENOUS | Status: AC
  Filled 2016-12-12: qty 2

## 2016-12-12 MED ORDER — QUETIAPINE FUMARATE 200 MG PO TABS
200.0000 mg | ORAL_TABLET | Freq: Three times a day (TID) | ORAL | Status: DC
  Administered 2016-12-12 – 2016-12-16 (×14): 200 mg via ORAL
  Filled 2016-12-12: qty 1
  Filled 2016-12-12: qty 4
  Filled 2016-12-12: qty 1
  Filled 2016-12-12: qty 4
  Filled 2016-12-12 (×5): qty 1
  Filled 2016-12-12 (×2): qty 4
  Filled 2016-12-12 (×3): qty 1
  Filled 2016-12-12: qty 4
  Filled 2016-12-12: qty 1
  Filled 2016-12-12 (×2): qty 4
  Filled 2016-12-12 (×4): qty 1
  Filled 2016-12-12: qty 4

## 2016-12-12 MED ORDER — ASPIRIN EC 81 MG PO TBEC
81.0000 mg | DELAYED_RELEASE_TABLET | Freq: Every day | ORAL | Status: DC
  Administered 2016-12-13 – 2016-12-16 (×4): 81 mg via ORAL
  Filled 2016-12-12 (×4): qty 1

## 2016-12-12 MED ORDER — FENTANYL CITRATE (PF) 100 MCG/2ML IJ SOLN
INTRAMUSCULAR | Status: DC | PRN
  Administered 2016-12-12: 25 ug via INTRAVENOUS

## 2016-12-12 MED ORDER — SODIUM CHLORIDE 0.9 % WEIGHT BASED INFUSION: 1.0000 mL/kg/h | INTRAVENOUS | Status: DC

## 2016-12-12 MED ORDER — GABAPENTIN 300 MG PO CAPS
300.0000 mg | ORAL_CAPSULE | Freq: Three times a day (TID) | ORAL | Status: DC
  Administered 2016-12-12 – 2016-12-16 (×14): 300 mg via ORAL
  Filled 2016-12-12 (×14): qty 1

## 2016-12-12 MED ORDER — ASPIRIN 81 MG PO CHEW: 81.0000 mg | CHEWABLE_TABLET | ORAL | Status: AC

## 2016-12-12 MED ORDER — PAROXETINE HCL 20 MG PO TABS
40.0000 mg | ORAL_TABLET | Freq: Every day | ORAL | Status: DC
  Administered 2016-12-13 – 2016-12-16 (×4): 40 mg via ORAL
  Filled 2016-12-12 (×4): qty 2

## 2016-12-12 MED ORDER — HEPARIN (PORCINE) IN NACL 2-0.9 UNIT/ML-% IJ SOLN
INTRAMUSCULAR | Status: AC | PRN
  Administered 2016-12-12: 1000 mL

## 2016-12-12 MED ORDER — ONDANSETRON HCL 4 MG/2ML IJ SOLN: 4.0000 mg | Freq: Four times a day (QID) | INTRAMUSCULAR | Status: DC | PRN

## 2016-12-12 MED ORDER — HEPARIN SODIUM (PORCINE) 1000 UNIT/ML IJ SOLN
INTRAMUSCULAR | Status: AC
  Filled 2016-12-12: qty 1

## 2016-12-12 MED ORDER — HEPARIN (PORCINE) IN NACL 2-0.9 UNIT/ML-% IJ SOLN
INTRAMUSCULAR | Status: AC
  Filled 2016-12-12: qty 1000

## 2016-12-12 MED ORDER — MIDAZOLAM HCL 2 MG/2ML IJ SOLN
INTRAMUSCULAR | Status: AC
  Filled 2016-12-12: qty 2

## 2016-12-12 MED ORDER — IOPAMIDOL (ISOVUE-370) INJECTION 76%
INTRAVENOUS | Status: AC
  Filled 2016-12-12: qty 100

## 2016-12-12 MED ORDER — ACETAMINOPHEN 325 MG PO TABS
650.0000 mg | ORAL_TABLET | ORAL | Status: DC | PRN
  Administered 2016-12-13: 650 mg via ORAL
  Administered 2016-12-15: 325 mg via ORAL
  Filled 2016-12-12 (×2): qty 2

## 2016-12-12 MED ORDER — SODIUM CHLORIDE 0.9% FLUSH
3.0000 mL | Freq: Two times a day (BID) | INTRAVENOUS | Status: DC
  Administered 2016-12-12 – 2016-12-14 (×4): 3 mL via INTRAVENOUS

## 2016-12-12 SURGICAL SUPPLY — 9 items
CATH 5FR JL3.5 JR4 ANG PIG MP (CATHETERS) ×2 IMPLANT
DEVICE RAD TR BAND REGULAR (VASCULAR PRODUCTS) ×2 IMPLANT
GLIDESHEATH SLEND SS 6F .021 (SHEATH) ×2 IMPLANT
GUIDEWIRE INQWIRE 1.5J.035X260 (WIRE) ×1 IMPLANT
INQWIRE 1.5J .035X260CM (WIRE) ×2
KIT HEART LEFT (KITS) ×2 IMPLANT
PACK CARDIAC CATHETERIZATION (CUSTOM PROCEDURE TRAY) ×2 IMPLANT
TRANSDUCER W/STOPCOCK (MISCELLANEOUS) ×2 IMPLANT
TUBING CIL FLEX 10 FLL-RA (TUBING) ×2 IMPLANT

## 2016-12-12 NOTE — H&P (View-Only) (Signed)
Cardiology Office Note    Date:  11/12/2016   ID:  Michelle [name], DOB 1960/11/15, MRN [id number]  PCP:  Girtha Rm, NP-C  Cardiologist:  New to Dr. Acie Fredrickson  Chief Complaint: elevated BNP and le edema  History of Present Illness:   BAYLEN DEA is a 56 y.o. female with PMH of HTN (not on any medication currently), CVA, seizure, cerebral aneurysm and HLD (not on any medications) and ongoing tobacco abuse referred by Girtha Rm, NP-C for evaluation of elevated BNP and edema.   Patient was recently seen by PCP for LE edema. BNP 369.6. CXR showed bibasilar atelectasis. Started on lasix with improved symptoms on follow up visit.  Here today for further evaluation. Her edema has been improved significantly. She complains of exertional dyspnea and chest pressure that relieved with rest. No associated diaphoresis or nausea. She denies N, dizziness, orthopnea, PND, syncope, melena or blood in her stool or urine. Currently smokes approximately 1/4-1/2 pack-a-day cigarette. She eats lots of salt. Grandmother had a MI at age 77.   Past Medical History:  Diagnosis Date  . ABDOMINAL PAIN RIGHT LOWER QUADRANT 07/26/2009  . Abdominal pain, left lower quadrant 07/26/2009  . ABDOMINAL PAIN, LOWER 11/29/2009  . ANEMIA-B12 DEFICIENCY 07/11/2009  . ANEMIA-IRON DEFICIENCY 06/21/2009  . ANXIETY 02/06/2007  . BACK PAIN 07/01/2007  . Benzodiazepine overdose 12/03/2011   July 2013  . CHEST PAIN 06/21/2009  . DEPRESSION 02/06/2007  . [place] DISEASE, CERVICAL 05/03/2009  . DYSPEPSIA 10/23/2007  . EAR PAIN, RIGHT 11/29/2009  . ELBOW PAIN, RIGHT 06/27/2010  . ELEVATED BLOOD PRESSURE WITHOUT DIAGNOSIS OF HYPERTENSION 07/01/2007  . FEVER UNSPECIFIED 06/03/2008  . FREQUENCY, URINARY 03/12/2007  . Genital herpes 01/03/2011  . GERD 07/11/2009  . Glossitis 10/23/2007  . HEAD TRAUMA, CLOSED 11/29/2009  . History of surgery for cerebral aneurysm 2015  . HYPERLIPIDEMIA 02/06/2007  . Insomnia, unspecified 02/06/2007  .  LATERAL EPICONDYLITIS, LEFT 06/03/2008  . LOW BACK PAIN 02/06/2007  . Major depression 11/16/2010  . MIGRAINE HEADACHE 02/06/2007  . OSTEOARTHRITIS 02/06/2007  . 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 OTITIS EXTERNA, RIGHT 10/04/2009  . VITAMIN B12 DEFICIENCY 07/26/2009    Past Surgical History:  Procedure Laterality Date  . ABDOMINAL HYSTERECTOMY  2004  . APPENDECTOMY    . CEREBRAL ANEURYSM REPAIR    . right ankle surgury    . s/p c-spine fusion c 5-6      Current Medications: Prior to Admission medications   Medication Sig Start Date End Date Taking? Authorizing Provider  calcium carbonate (OS-CAL) 600 MG tablet Take 600 mg by mouth 2 (two) times daily.    [provider]  furosemide (LASIX) 20 MG tablet Take 1 tablet (20 mg total) by mouth daily. 10/17/16   Henson, Vickie L, NP-C  gabapentin (NEURONTIN) 300 MG capsule Take 300 mg by mouth 3 (three) times daily.    [provider]  PARoxetine (PAXIL) 30 MG tablet Take 30 mg by mouth daily.    [provider]  QUEtiapine (SEROQUEL) 200 MG tablet Take 200 mg by mouth at bedtime.    [provider]  venlafaxine XR (EFFEXOR-XR) 150 MG 24 hr capsule Take 300 mg by mouth daily.    [provider]    Allergies:   Citalopram; Diclofenac sodium; Fluoxetine hcl; and Ibuprofen   Social History   Social History  . Marital status: Widowed    Spouse name: N/A  . Number of children: N/A  .  Years of education: N/A   Occupational History  . 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 Smoking status: Current Every Day Smoker    Packs/day: 1.00    Years: 38.00    Types: Cigarettes  . Smokeless tobacco: Never Used  . Alcohol use No  . Drug use: No  . Sexual activity: Not on file   Other Topics Concern  . Not on file   Social History Narrative   Currently living apart from husband     Family History:  The patient's family history  includes Coronary artery disease (age of onset: 77) in her other; Diabetes in her other; Hypertension in her other.   ROS:   Please see the history of present illness.    ROS All other systems reviewed and are negative.   PHYSICAL EXAM:   VS:  BP 112/72   Pulse (!) 102   Ht 5\' 3"  (1.6 m)   Wt 148 lb (67.1 kg)   BMI 26.22 kg/m    GEN: Well nourished, well developed, in no acute distress  HEENT: normal  Neck: no JVD, carotid bruits, or masses Cardiac: RRR; no murmurs, rubs, or gallops, Trace edema  Respiratory:  clear to auscultation bilaterally, normal work of breathing GI: soft, nontender, nondistended, + BS MS: no deformity or atrophy  Skin: warm and dry, no rash Neuro:  Alert and Oriented x 3, Strength and sensation are intact Psych: euthymic mood, full affect  Wt Readings from Last 3 Encounters:  11/12/16 148 lb (67.1 kg)  10/25/16 149 lb 12.8 oz (67.9 kg)  10/16/16 153 lb (69.4 kg)      Studies/Labs Reviewed:   EKG:  EKG is ordered today.  The ekg ordered today demonstrates Sinus tachycardia at rate of 102 bpm. With T-wave inversion in inferior and anterior lateral lead. Somewhat more pronounced compared to EKG of 09-06-14.  Recent Labs: 10/16/2016: ALT 12; Brain Natriuretic Peptide 369.6; Hemoglobin 11.0; Platelets 319 10/25/2016: BUN 12; Creat 0.74; Potassium 4.3; Sodium 142   Lipid Panel    Component Value Date/Time   CHOL 273 (H) 06/27/2010 0940   TRIG 95.0 06/27/2010 0940   HDL 66.50 06/27/2010 0940   CHOLHDL 4 06/27/2010 0940   VLDL 19.0 06/27/2010 0940   LDLCALC 99 06/14/2009 1249   LDLDIRECT 189.4 06/27/2010 0940    Additional studies/ records that were reviewed today include:   As above    ASSESSMENT & PLAN:    1. Exertional dyspnea with chest pressure - Her symptoms is concerning for angina. Relief with rest. Her cardiac risk factor includes hyperlipidemia, hypertension, CVA and ongoing tobacco abuse. EKG with nonspecific changes. Will get  echocardiogram and Lexiscan Myoview for further evaluation. Start ASA 81mg  qd.   2. Lower extremity edema - Edema has improved on Lasix. Advised to cut back on salt.  3. Hyperlipidemia - Lipid profile 01/2016 by PCP: LDL 157, triglyceride 404 and HDL 57. Start Lipitor 40 mg daily. Lipid panel and LFTs in 4-6 week  4.Hypertension - Stable and well controlled on Lasix only.  5. Tobacco abuse -Advised smoking cessation. Education given. She does not need any medication help.   The patient was seen and examined by Dr. Acie Fredrickson  Medication Adjustments/Labs and Tests Ordered: Current medicines are reviewed at length with the patient today.  Concerns regarding medicines are outlined above.  Medication changes, Labs and Tests ordered today are listed in the Patient Instructions below. Patient Instructions  Medication Instructions:   START TAKING  ASPIRIN 81 MG ONCE  A DAY   START TAKING  LIPITOR 40 MG ONCE A DAY    If you need a refill on your cardiac medications before your next appointment, please call your pharmacy.  Labwork: Your physician has requested that you have a lexiscan myoview. For further information please visit [email or web address]. Please follow instruction sheet, as given.  Your physician has requested that you have an echocardiogram. Echocardiography is a painless test that uses sound waves to create images of your heart. It provides your doctor with information about the size and shape of your heart and how well your heart's chambers and valves are working. This procedure takes approximately one hour. There are no restrictions for this procedure.  Testing/Procedures:   Follow-Up:  WITH BHAGAT AFTER PROCEDURES    Any Other Special Instructions Will Be Listed Below (If Applicable).                                                                                                                                                      Jarrett Soho, [place]    11/12/2016 3:26 PM    [facility] [place], [place], [place]  [address] Phone: [phone]; Fax: [phone]    Attending Note:   The patient was seen and examined.  Agree with assessment and plan as noted above.  Changes made to the above note as needed.  Patient seen and independently examined with Robbie Lis, PA .   We discussed all aspects of the encounter. I agree with the assessment and plan as stated above.  1.   Chest pain :   Some aspects of her chest discomfort are concerning for angina. She continues to smoke. We advised her to stop smoking as continued smoking will contribute  to coronary artery disease. We'll schedule a stress Myoview.  2.  Leg edema :   This may be due to COPD or CHF She still smokes.   Better on Lasix.  3.  Cigarette smoking:    I counseled her about smoking cessation-  she became upset that I mentioned it.      I have spent a total of 40 minutes with patient reviewing hospital  notes , telemetry, EKGs, labs and examining patient as well as establishing an assessment and plan that was discussed with the patient. > 50% of time was spent in direct patient care.    Thayer Headings, Brooke Bonito., MD, [facility] 11/15/2016, 8:48 AM [address]  [place] Pager [phone]

## 2016-12-12 NOTE — Interval H&P Note (Signed)
Cath Lab Visit (complete for each Cath Lab visit)  Clinical Evaluation Leading to the Procedure:   ACS: No.  Non-ACS:    Anginal Classification: CCS III  Anti-ischemic medical therapy: Minimal Therapy (1 class of medications)  Non-Invasive Test Results: Intermediate-risk stress test findings: cardiac mortality 1-3%/year  Prior CABG: No previous CABG      History and Physical Interval Note:  12/12/2016 1:46 PM  Michelle [name]  has presented today for surgery, with the diagnosis of abnormal stress test - cp - doe  The various methods of treatment have been discussed with the patient and family. After consideration of risks, benefits and other options for treatment, the patient has consented to  Procedure(s): Left Heart Cath and Coronary Angiography (N/A) as a surgical intervention .  The patient's history has been reviewed, patient examined, no change in status, stable for surgery.  I have reviewed the patient's chart and labs.  Questions were answered to the patient's satisfaction.     Larae Grooms

## 2016-12-13 ENCOUNTER — Encounter (HOSPITAL_COMMUNITY): Payer: Self-pay | Admitting: Interventional Cardiology

## 2016-12-13 ENCOUNTER — Inpatient Hospital Stay (HOSPITAL_COMMUNITY): Payer: BLUE CROSS/BLUE SHIELD

## 2016-12-13 DIAGNOSIS — I25110 Atherosclerotic heart disease of native coronary artery with unstable angina pectoris: Secondary | ICD-10-CM

## 2016-12-13 DIAGNOSIS — I25118 Atherosclerotic heart disease of native coronary artery with other forms of angina pectoris: Secondary | ICD-10-CM

## 2016-12-13 DIAGNOSIS — I5031 Acute diastolic (congestive) heart failure: Secondary | ICD-10-CM

## 2016-12-13 DIAGNOSIS — I509 Heart failure, unspecified: Secondary | ICD-10-CM

## 2016-12-13 LAB — PULMONARY FUNCTION TEST
DL/VA % PRED: 97 %
DL/VA: 4.43 ml/min/mmHg/L
DLCO unc % pred: 71 %
DLCO unc: 15.36 ml/min/mmHg
FEF 25-75 PRE: 1.93 L/s
FEF2575-%PRED-PRE: 82 %
FEV1-%PRED-PRE: 72 %
FEV1-PRE: 1.78 L
FEV1FVC-%Pred-Pre: 113 %
FEV6-%Pred-Pre: 65 %
FEV6-PRE: 1.99 L
FEV6FVC-%PRED-PRE: 104 %
FVC-%PRED-PRE: 63 %
FVC-PRE: 1.99 L
Pre FEV1/FVC ratio: 89 %
Pre FEV6/FVC Ratio: 100 %
RV % pred: 114 %
RV: 2.09 L
TLC % pred: 83 %
TLC: 3.97 L

## 2016-12-13 LAB — CBC
HCT: 36.7 % (ref 36.0–46.0)
HEMOGLOBIN: 11.2 g/dL — AB (ref 12.0–15.0)
MCH: 28 pg (ref 26.0–34.0)
MCHC: 30.5 g/dL (ref 30.0–36.0)
MCV: 91.8 fL (ref 78.0–100.0)
PLATELETS: 355 10*3/uL (ref 150–400)
RBC: 4 MIL/uL (ref 3.87–5.11)
RDW: 14.9 % (ref 11.5–15.5)
WBC: 10.7 10*3/uL — AB (ref 4.0–10.5)

## 2016-12-13 LAB — HEPARIN LEVEL (UNFRACTIONATED): Heparin Unfractionated: 0.32 IU/mL (ref 0.30–0.70)

## 2016-12-13 MED ORDER — NITROGLYCERIN 0.4 MG SL SUBL
0.4000 mg | SUBLINGUAL_TABLET | SUBLINGUAL | Status: DC | PRN
Start: 2016-12-13 — End: 2016-12-17

## 2016-12-13 MED ORDER — POLYETHYLENE GLYCOL 3350 17 G PO PACK
17.0000 g | PACK | Freq: Every day | ORAL | Status: DC
  Administered 2016-12-13 – 2016-12-16 (×4): 17 g via ORAL
  Filled 2016-12-13 (×4): qty 1

## 2016-12-13 MED ORDER — HEPARIN (PORCINE) IN NACL 100-0.45 UNIT/ML-% IJ SOLN
850.0000 [IU]/h | INTRAMUSCULAR | Status: DC
  Administered 2016-12-13: 900 [IU]/h via INTRAVENOUS
  Administered 2016-12-14: 950 [IU]/h via INTRAVENOUS
  Administered 2016-12-15: 850 [IU]/h via INTRAVENOUS
  Filled 2016-12-13 (×4): qty 250

## 2016-12-13 MED ORDER — HYDROCODONE-ACETAMINOPHEN 5-325 MG PO TABS
1.0000 | ORAL_TABLET | Freq: Four times a day (QID) | ORAL | Status: DC | PRN
  Administered 2016-12-13 – 2016-12-15 (×4): 1 via ORAL
  Administered 2016-12-16 (×2): 2 via ORAL
  Filled 2016-12-13 (×4): qty 1
  Filled 2016-12-13 (×2): qty 2

## 2016-12-13 MED ORDER — METOPROLOL TARTRATE 12.5 MG HALF TABLET
12.5000 mg | ORAL_TABLET | Freq: Two times a day (BID) | ORAL | Status: DC
  Administered 2016-12-13 – 2016-12-16 (×8): 12.5 mg via ORAL
  Filled 2016-12-13 (×8): qty 1

## 2016-12-13 MED FILL — Lidocaine HCl Local Preservative Free (PF) Inj 1%: INTRAMUSCULAR | Qty: 30 | Status: AC

## 2016-12-13 NOTE — Consult Note (Signed)
[address]       [place] [address]             [phone]      Cardiothoracic Surgery Consultation  Reason for Consult: severe multi-vessel coronary artery disease Referring Physician: Dr. Carie Caddy is an 56 y.o. female.  HPI:   The patient is a 56 year old woman with hyperlipidemia, hypertension, and ongoing smoking who was evaluated by Dr. Acie Fredrickson one month ago for edema in her legs and elevated BNP as well as substernal burning pain that she thought was heartburn. She was initially seen by her PCP and started on lasix with improvement in her edema. She underwent a nuclear stress test on 7/12 that was abnormal showing inferior and inferolateral ischemia. She was scheduled for echo and cath yesterday. Her echo showed an EF of 60-65% with grade 1 diastolic dysfunction. Valves ok. Cath yesterday showed severe 2-vessel CAD with 80% proximal to mid LAD, 75% ostial diagonal, 95% distal RCA bifurcation stenosis extending into PDA and distal RCA.  She reports a several month history of exertional fatigue and shortness of breath. She has SSCP that is like heartburn occurring with exertion and at rest. Swelling in both legs that was marked prior to going to her PCP and she never had that before.  Past Medical History:  Diagnosis Date  . ABDOMINAL PAIN RIGHT LOWER QUADRANT 07/26/2009  . Abdominal pain, left lower quadrant 07/26/2009  . ABDOMINAL PAIN, LOWER 11/29/2009  . ANEMIA-B12 DEFICIENCY 07/11/2009  . ANEMIA-IRON DEFICIENCY 06/21/2009  . ANXIETY 02/06/2007  . Arthritis    "back" (12/12/2016)  . BACK PAIN 07/01/2007  . Benzodiazepine overdose 12/03/2011   July 2013  . CAD (coronary artery disease)    LHC 12/12/16: Multivessel CAD  . CHEST PAIN 06/21/2009  . DEPRESSION 02/06/2007  . [place] DISEASE, CERVICAL 05/03/2009  . DYSPEPSIA 10/23/2007  . EAR PAIN, RIGHT 11/29/2009  . ELBOW PAIN, RIGHT 06/27/2010  . ELEVATED BLOOD PRESSURE WITHOUT DIAGNOSIS OF HYPERTENSION  07/01/2007  . FEVER UNSPECIFIED 06/03/2008  . FREQUENCY, URINARY 03/12/2007  . Genital herpes 01/03/2011  . GERD 07/11/2009  . Glossitis 10/23/2007  . HEAD TRAUMA, CLOSED 11/29/2009  . History of surgery for cerebral aneurysm 2015  . HYPERLIPIDEMIA 02/06/2007  . Insomnia, unspecified 02/06/2007  . LATERAL EPICONDYLITIS, LEFT 06/03/2008  . LOW BACK PAIN 02/06/2007  . Major depression 11/16/2010  . MIGRAINE HEADACHE 02/06/2007   "used to get them all the time; stopped a few years ago" (12/12/2016)  . OSTEOARTHRITIS 02/06/2007  . 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 OTITIS EXTERNA, RIGHT 10/04/2009  . Stroke [facility]) 2016-2017 X 3   RLE "goes to the side a little bit when I walk" (12/12/2016)  . VITAMIN B12 DEFICIENCY 07/26/2009    Past Surgical History:  Procedure Laterality Date  . ABDOMINAL HYSTERECTOMY  2005   "still have my ovaries"  . ANKLE FRACTURE SURGERY Right ?1991  . ANTERIOR CERVICAL DECOMP/DISCECTOMY FUSION  2008   C5-6  . APPENDECTOMY  04/2001   lap appy/notes 10/11/2010  . BACK SURGERY    . CARDIAC CATHETERIZATION  12/12/2016  . CEREBRAL ANEURYSM REPAIR    . CYSTOSCOPY W/ URETEROSCOPY  02/17/2002    Cystoscopy, right retrograde pyelogram with interpretation, left ureteroscopy with holmium and right ureteroscopy/notes 10/11/2010  . LEFT HEART CATH AND CORONARY ANGIOGRAPHY N/A 12/12/2016   Procedure: Left Heart Cath and Coronary Angiography;  Surgeon: Jettie Booze, MD;  Location: [facility]  INVASIVE CV LAB;  Service: Cardiovascular;  Laterality: N/A;  . TUBAL LIGATION      Family History  Problem Relation Age of Onset  . Diabetes Other   . Hypertension Other   . Coronary artery disease Other 54       Female 1st degree relative  . Cancer - Lung Father   . Cirrhosis Brother   . Diabetes Brother     Social History:  reports that she quit smoking about 4 weeks ago. Her smoking use included Cigarettes. She has a 63.00 pack-year smoking history. She has never used smokeless tobacco.  She reports that she drinks alcohol. She reports that she does not use drugs.  Allergies:  Allergies  Allergen Reactions  . Citalopram     REACTION: "feels wierd"  . Diclofenac Sodium     REACTION: GI Upset  . Fluoxetine Hcl Nausea Only  . Ibuprofen Nausea Only    Medications:  I have reviewed the patient's current medications. Prior to Admission:  Prescriptions Prior to Admission  Medication Sig Dispense Refill Last Dose  . aspirin EC 81 MG tablet Take 81 mg by mouth daily.   12/12/2016 at 0800  . atorvastatin (LIPITOR) 40 MG tablet Take 1 tablet (40 mg total) by mouth daily. 30 tablet 3 12/11/2016 at 2200  . furosemide (LASIX) 20 MG tablet TAKE 1 TABLET BY MOUTH EVERY DAY 30 tablet 1 12/11/2016 at Unknown time  . gabapentin (NEURONTIN) 300 MG capsule Take 300 mg by mouth 3 (three) times daily.   12/12/2016 at 0730  . Multiple Vitamin (MULTIVITAMIN WITH MINERALS) TABS tablet Take 1 tablet by mouth daily.   12/12/2016 at Unknown time  . PARoxetine (PAXIL) 40 MG tablet Take 40 mg by mouth daily.  2 12/12/2016 at Unknown time  . polyethylene glycol powder (GLYCOLAX/MIRALAX) powder Take 17 g by mouth 2 (two) times daily as needed. (Patient taking differently: Take 17 g by mouth daily. ) 3350 g 1 Past Week at Unknown time  . QUEtiapine (SEROQUEL) 200 MG tablet Take 200 mg by mouth 3 (three) times daily.    12/12/2016 at 0800  . ranitidine (ZANTAC) 150 MG tablet Take 1 tablet (150 mg total) by mouth 2 (two) times daily. 30 tablet 1 12/12/2016 at 0800  . venlafaxine XR (EFFEXOR-XR) 150 MG 24 hr capsule Take 300 mg by mouth daily.   12/12/2016 at 0800   Scheduled: . aspirin EC  81 mg Oral Daily  . atorvastatin  40 mg Oral QHS  . famotidine  20 mg Oral QHS  . gabapentin  300 mg Oral TID  . metoprolol tartrate  12.5 mg Oral BID  . multivitamin with minerals  1 tablet Oral Daily  . PARoxetine  40 mg Oral Daily  . polyethylene glycol  17 g Oral Daily  . QUEtiapine  200 mg Oral TID  . sodium  chloride flush  3 mL Intravenous Q12H  . venlafaxine XR  300 mg Oral Daily   Continuous: . sodium chloride    . heparin 900 Units/hr (12/13/16 1408)   VEH:MCNOBS chloride, acetaminophen, HYDROcodone-acetaminophen, nitroGLYCERIN, ondansetron (ZOFRAN) IV, sodium chloride flush Anti-infectives    None      Results for orders placed or performed during the hospital encounter of 12/12/16 (from the past 48 hour(s))  Protime-INR     Status: None   Collection Time: 12/12/16 10:30 AM  Result Value Ref Range   Prothrombin Time 12.5 11.4 - 15.2 seconds   INR 0.94  No results found.  Review of Systems  Constitutional: Positive for malaise/fatigue. Negative for chills and fever.  HENT: Negative.   Eyes: Negative.   Respiratory: Positive for shortness of breath.   Cardiovascular: Positive for chest pain and leg swelling. Negative for orthopnea and PND.  Gastrointestinal: Negative.   Genitourinary: Negative.   Musculoskeletal: Negative.   Skin: Negative.   Neurological: Negative.   Endo/Heme/Allergies: Negative.   Psychiatric/Behavioral: Negative.    Blood pressure (!) 157/86, pulse 96, temperature 98 F (36.7 C), temperature source Oral, resp. rate 18, height 5\' 2"  (1.575 m), weight 71.6 kg (157 lb 12.8 oz), SpO2 98 %. Physical Exam  Constitutional: She is oriented to person, place, and time. She appears well-developed and well-nourished. No distress.  HENT:  Head: Normocephalic and atraumatic.  Mouth/Throat: Oropharynx is clear and moist.  Eyes: Pupils are equal, round, and reactive to light. EOM are normal.  Neck: Normal range of motion. Neck supple. No JVD present. No thyromegaly present.  Cardiovascular: Normal rate, regular rhythm, normal heart sounds and intact distal pulses.   No murmur heard. Respiratory: Effort normal and breath sounds normal. No respiratory distress. She has no rales.  GI: Soft. Bowel sounds are normal. She exhibits no distension and no mass. There is  no tenderness.  Musculoskeletal: Normal range of motion. She exhibits no edema.  Lymphadenopathy:    She has no cervical adenopathy.  Neurological: She is alert and oriented to person, place, and time. She has normal strength. No cranial nerve deficit or sensory deficit.  Skin: Skin is warm and dry.  Psychiatric: She has a normal mood and affect.     [facility]                        [address], [place] [address]                            [phone]  ------------------------------------------------------------------- Transthoracic Echocardiography  Patient:    Shelton, Soler MR #:       [id number] Study Date: 12/12/2016 Gender:     F Age:        17 Height:     160 cm Weight:     67.1 kg BSA:        1.74 m^2 Pt. Status: Room:   Faylene Million, Scott T  Joanna Puff T  ATTENDING    Candee Furbish, M.D.  SONOGRAPHER  Cindy Hazy, RDCS  PERFORMING   [facility], Outpatient  cc:  ------------------------------------------------------------------- LV EF: 60% -   65%  ------------------------------------------------------------------- Indications:      R06.02 [place] of Breath.  ------------------------------------------------------------------- History:   PMH:  Acquired from the patient and from the patient&'s chart.  PMH:  Anxiety. Chest Pain.  Risk factors:  Dyslipidemia.   ------------------------------------------------------------------- Study Conclusions  - Left ventricle: The cavity size was normal. There was mild focal   basal hypertrophy of the septum. Systolic function was normal.   The estimated ejection fraction was in the range of 60% to 65%.   Wall motion was normal; there were no regional wall motion   abnormalities. Doppler parameters are consistent with abnormal   left ventricular relaxation (grade 1 diastolic dysfunction). - Mitral valve: There was  trivial regurgitation. -  Tricuspid valve: There was trivial regurgitation.  ------------------------------------------------------------------- Study data:   Study status:  Routine.  Procedure:  The patient reported no pain pre or post test. Transthoracic echocardiography for left ventricular function evaluation, for right ventricular function evaluation, and for assessment of valvular function. Image quality was adequate.  Study completion:  There were no complications.          Transthoracic echocardiography.  M-mode, complete 2D, spectral Doppler, and color Doppler.  Birthdate: Patient birthdate: [birth year].  Age:  Patient is 56 yr old.  Sex: Gender: female.    BMI: 26.2 kg/m^2.  Blood pressure:     112/72 Patient status:  Outpatient.  Study date:  Study date: 12/12/2016. Study time: 07:51 AM.  Location:  [facility]  -------------------------------------------------------------------  ------------------------------------------------------------------- Left ventricle:  The cavity size was normal. There was mild focal basal hypertrophy of the septum. Systolic function was normal. The estimated ejection fraction was in the range of 60% to 65%. Wall motion was normal; there were no regional wall motion abnormalities. Doppler parameters are consistent with abnormal left ventricular relaxation (grade 1 diastolic dysfunction).  ------------------------------------------------------------------- Aortic valve:   Trileaflet; normal thickness leaflets. Mobility was not restricted.  Doppler:  Transvalvular velocity was within the normal range. There was no stenosis. There was no regurgitation.   ------------------------------------------------------------------- Aorta:  Aortic root: The aortic root was normal in size.  ------------------------------------------------------------------- Mitral valve:   Structurally normal valve.   Mobility was not restricted.  Doppler:  Transvalvular velocity was within  the normal range. There was no evidence for stenosis. There was trivial regurgitation.  ------------------------------------------------------------------- Left atrium:  The atrium was normal in size.  ------------------------------------------------------------------- Right ventricle:  The cavity size was normal. Wall thickness was normal. Systolic function was normal.  ------------------------------------------------------------------- Pulmonic valve:   Poorly visualized.  Structurally normal valve. Cusp separation was normal.  Doppler:  Transvalvular velocity was within the normal range. There was no evidence for stenosis. There was no regurgitation.  ------------------------------------------------------------------- Tricuspid valve:   Structurally normal valve.    Doppler: Transvalvular velocity was within the normal range. There was trivial regurgitation.  ------------------------------------------------------------------- Pulmonary artery:   The main pulmonary artery was normal-sized. Systolic pressure was within the normal range.  ------------------------------------------------------------------- Right atrium:  The atrium was normal in size.  ------------------------------------------------------------------- Pericardium:  There was no pericardial effusion.  ------------------------------------------------------------------- Systemic veins: Inferior vena cava: The vessel was normal in size.  ------------------------------------------------------------------- Measurements   Left ventricle                         Value        Reference  LV ID, ED, PLAX chordal        (L)     41.1  mm     43 - 52  LV ID, ES, PLAX chordal                27.7  mm     23 - 38  LV fx shortening, PLAX chordal         33    %      >=29  LV PW thickness, ED                    11.6  mm     ---------  IVS/LV PW ratio, ED                    1.16         <=  1.3  Stroke volume, 2D                       46    ml     ---------  Stroke volume/bsa, 2D                  26    ml/m^2 ---------  LV e&', lateral                         5.76  cm/s   ---------  LV E/e&', lateral                       8.73         ---------  LV e&', medial                          4.61  cm/s   ---------  LV E/e&', medial                        10.91        ---------  LV e&', average                         5.19  cm/s   ---------  LV E/e&', average                       9.7          ---------    Ventricular septum                     Value        Reference  IVS thickness, ED                      13.4  mm     ---------    LVOT                                   Value        Reference  LVOT ID, S                             20    mm     ---------  LVOT area                              3.14  cm^2   ---------  LVOT ID                                20    mm     ---------  LVOT peak velocity, S                  91.1  cm/s   ---------  LVOT mean velocity, S                  63.4  cm/s   ---------  LVOT VTI, S  14.7  cm     ---------  LVOT peak gradient, S                  3     mm Hg  ---------  Stroke volume (SV), LVOT DP            46.2  ml     ---------  Stroke index (SV/bsa), LVOT DP         26.5  ml/m^2 ---------    Aorta                                  Value        Reference  Aortic root ID, ED                     32    mm     ---------  Ascending aorta ID, A-P, S             34    mm     ---------    Left atrium                            Value        Reference  LA ID, A-P, ES                         38    mm     ---------  LA ID/bsa, A-P                         2.18  cm/m^2 <=2.2  LA volume, S                           39    ml     ---------  LA volume/bsa, S                       22.4  ml/m^2 ---------  LA volume, ES, 1-p A4C                 33    ml     ---------  LA volume/bsa, ES, 1-p A4C             18.9  ml/m^2 ---------  LA volume, ES, 1-p A2C                  43    ml     ---------  LA volume/bsa, ES, 1-p A2C             24.7  ml/m^2 ---------    Mitral valve                           Value        Reference  Mitral E-wave peak velocity            50.3  cm/s   ---------  Mitral A-wave peak velocity            82.9  cm/s   ---------  Mitral deceleration time       (H)     232   ms     150 - 230  Mitral E/A ratio, peak  0.6          ---------    Right ventricle                        Value        Reference  RV s&', lateral, S                      19.6  cm/s   ---------  Legend: (L)  and  (H)  mark values outside specified reference range.  ------------------------------------------------------------------- Prepared and Electronically Authenticated by  Candee Furbish, M.D. 2018-07-18T12:07:29  [name]  Cardiac catheterization  Order# [id number]  Reading physician: Jettie Booze, MD Ordering physician: Jettie Booze, MD Study date: 12/12/16  Physicians   Panel Physicians Referring Physician Case Authorizing Physician  Jettie Booze, MD (Primary)    Procedures   Left Heart Cath and Coronary Angiography  Conclusion     Mid RCA lesion, 60 %stenosed.  RPDA lesion, 95 %stenosed.  Dist RCA lesion, 95 %stenosed.  Post Atrio lesion, 95 %stenosed.  Mid LAD lesion, 80 %stenosed.  Ost 2nd Diag to 2nd Diag lesion, 75 %stenosed.  The left ventricular systolic function is normal.  LV end diastolic pressure is normal.  The left ventricular ejection fraction is 55-65% by visual estimate.  There is no aortic valve stenosis.   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   It is reasonable to consider CABG given the bifurcation disease, although there are percutaneous options as well.  I think a consult is warranted.  She has had some rest pain.  Will admit to inpatient service.  Start heparin 6 hours post sheath pull.       Indications   Angina  pectoris ([place]) [I20.9 (ICD-10-CM)]  Procedural Details/Technique   Technical Details The risks, benefits, and details of the procedure were explained to the patient. The patient verbalized understanding and wanted to proceed. Informed written consent was obtained.  PROCEDURE TECHNIQUE: After Xylocaine anesthesia a 59F slender sheath was placed in the right radial artery with a single anterior needle wall stick. IV Heparin was given. Left ventriculography was done using a JR4 catheter. Right coronary angiography was done using a Judkins R4 guide catheter. Left coronary angiography was done using a Judkins L3.5 guide catheter. A TR band was used for hemostasis.  Contrast: 50 cc    Estimated blood loss <50 mL.  During this procedure the patient was administered the following to achieve and maintain moderate conscious sedation: Versed 2 mg, Fentanyl 25 mcg, while the patient's heart rate, blood pressure, and oxygen saturation were continuously monitored. The period of conscious sedation was 24 minutes, of which I was present face-to-face 100% of this time.    Complications   Complications documented before study signed (12/12/2016 2:31 PM EDT)    No complications were associated with this study.  Documented by Jettie Booze, MD - 12/12/2016 2:30 PM EDT    Coronary Findings   Dominance: Right  Left Anterior Descending  Mid LAD lesion, 80% stenosed.  Second Diagonal [employer] 2nd Diag to 2nd Diag lesion, 75% stenosed.  Right Coronary Artery  Mid RCA lesion, 60% stenosed.  Dist RCA lesion, 95% stenosed.  Right Posterior Descending Artery  RPDA filled by collaterals from Dist LAD.  RPDA lesion, 95% stenosed.  Right Posterior Atrioventricular Branch  Post Atrio lesion, 95% stenosed.  Wall Motion  Left Heart   Left Ventricle The left ventricular size is normal. The left ventricular systolic function is normal. LV end diastolic pressure is normal. The left  ventricular ejection fraction is 55-65% by visual estimate. There are LV function abnormalities due to segmental dysfunction.    Aortic Valve There is no aortic valve stenosis.    Coronary Diagrams   Diagnostic Diagram       Implants     No implant documentation for this case.  PACS Images   Show images for Cardiac catheterization   Link to Procedure Log   Procedure Log    Hemo Data    Most Recent Value  AO Systolic Pressure 749 mmHg  AO Diastolic Pressure 79 mmHg  AO Mean 449 mmHg  LV Systolic Pressure 675 mmHg  LV Diastolic Pressure -2 mmHg  LV EDP 3 mmHg  Arterial Occlusion Pressure Extended Systolic Pressure 916 mmHg  Arterial Occlusion Pressure Extended Diastolic Pressure 75 mmHg  Arterial Occlusion Pressure Extended Mean Pressure 95 mmHg  Left Ventricular Apex Extended Systolic Pressure 384 mmHg  Left Ventricular Apex Extended Diastolic Pressure 5 mmHg  Left Ventricular Apex Extended EDP Pressure 9 mmHg  Order-Level Documents - 12/12/2016:       Assessment/Plan:  She has severe 2-vessel CAD with unstable angina and NYHA class III congestive heart failure symptoms with a normal LVEF on echo. I agree that CABG is the best treatment for her. I reviewed her echo and cath studies with her and her daughter. I discussed the operative procedure with the patient and her daughter including alternatives, benefits and risks; including but not limited to bleeding, blood transfusion, infection, stroke, myocardial infarction, graft failure, heart block requiring a permanent pacemaker, organ dysfunction, and death.  [name] understands and agrees to proceed.  We will schedule surgery for Monday am. She should remain in the hospital on heparin.   I spent 60 minutes performing this consultation and > 50% of this time was spent face to face counseling and coordinating the care of this patient's coronary artery disease.  Fernande Boyden Lamia Mariner 12/13/2016, 3:24 PM

## 2016-12-13 NOTE — Progress Notes (Signed)
Progress Note  Patient Name: Michelle [name] Date of Encounter: 12/13/2016  Primary Cardiologist: New- Dr. Acie Fredrickson  Subjective   The patient is currently chest pain-free and without dyspnea. She did have mild orthopnea last night, no PND. Her lower extremity edema is nearly resolved.   Inpatient Medications    Scheduled Meds: . aspirin EC  81 mg Oral Daily  . atorvastatin  40 mg Oral QHS  . famotidine  20 mg Oral QHS  . gabapentin  300 mg Oral TID  . multivitamin with minerals  1 tablet Oral Daily  . PARoxetine  40 mg Oral Daily  . polyethylene glycol  17 g Oral Daily  . QUEtiapine  200 mg Oral TID  . sodium chloride flush  3 mL Intravenous Q12H  . venlafaxine XR  300 mg Oral Daily   Continuous Infusions: . sodium chloride     PRN Meds: sodium chloride, acetaminophen, ondansetron (ZOFRAN) IV, sodium chloride flush   Vital Signs    Vitals:   12/12/16 1555 12/12/16 1631 12/12/16 2020 12/13/16 0500  BP: 118/72 126/84 118/82 120/77  Pulse: 97 96 92 87  Resp: 16 17 17 16   Temp:  98.4 F (36.9 C) 97.8 F (36.6 C) (!) 97.5 F (36.4 C)  TempSrc:  Oral Oral Oral  SpO2: 96% 97% 96% 99%  Weight:    157 lb 12.8 oz (71.6 kg)  Height:        Intake/Output Summary (Last 24 hours) at 12/13/16 1204 Last data filed at 12/13/16 0853  Gross per 24 hour  Intake              945 ml  Output              200 ml  Net              745 ml   Filed Weights   12/12/16 0909 12/13/16 0500  Weight: 158 lb (71.7 kg) 157 lb 12.8 oz (71.6 kg)    Telemetry    Sinus rhythm with PVCs and resting rates in the 70s to 80s- Personally Reviewed  ECG    No recent tracings  Physical Exam   GEN: No acute distress.   Neck: No JVD Cardiac: RRR, no murmurs, rubs, or gallops.  Respiratory: Clear to auscultation bilaterally. GI: Soft, nontender, non-distended  MS: Trace lower extremity edema; No deformity. Neuro:  Nonfocal  Psych: Normal affect   Radiology    No results  found.  Cardiac Studies   Left Heart Cath and Coronary Angiography 12/12/16  Conclusion    Mid RCA lesion, 60 %stenosed.  RPDA lesion, 95 %stenosed.  Dist RCA lesion, 95 %stenosed.  Post Atrio lesion, 95 %stenosed.  Mid LAD lesion, 80 %stenosed.  Ost 2nd Diag to 2nd Diag lesion, 75 %stenosed.  The left ventricular systolic function is normal.  LV end diastolic pressure is normal.  The left ventricular ejection fraction is 55-65% by visual estimate.  There is no aortic valve stenosis.   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   It is reasonable to consider CABG given the bifurcation disease, although there are percutaneous options as well.  I think a consult is warranted.  She has had some rest pain.  Will admit to inpatient service.  Start heparin 6 hours post sheath pull.      Echocardiogram 12/12/16 Study Conclusions  - Left ventricle: The cavity size was normal. There was mild focal   basal hypertrophy  of the septum. Systolic function was normal.   The estimated ejection fraction was in the range of 60% to 65%.   Wall motion was normal; there were no regional wall motion   abnormalities. Doppler parameters are consistent with abnormal   left ventricular relaxation (grade 1 diastolic dysfunction). - Mitral valve: There was trivial regurgitation. - Tricuspid valve: There was trivial regurgitation.  Patient Profile     56 y.o. female  with PMH of HTN (not on any medication currently), CVA, seizure, cerebral aneurysm and HLD (not on any medications) and ongoing tobacco abuse who presented for evaluation of elevated BNP and edema. Pt also had exertional chest pressure and dyspnea relieved with rest. Her edema improved with Lasix. She was scheduled for cardiac cath.   Assessment & Plan    CAD: Patient with symptoms of exertional chest pressure and dyspnea and lower extremity edema along with elevated BNP. She was taken to the  cath lab yesterday and found to have severe two-vessel disease with both lesions being at significant bifurcations. See full report above. Currently chest pain-free. We'll make referral to cardiovascular surgery for assessment for possible CABG. echocardiogram showed normal LV function with EF 60-65%, no regional wall motion abnormalities, grade 1 diastolic dysfunction. Continue aspirin 81 mg, statin and IV heparin and as needed nitroglycerin. We'll add low-dose beta blocker.   Lower extremity edema and shortness of breath: Symptoms improved with Lasix. Echo showed normal LV systolic function with EF 60-65%.  Hypertension: Not previously treated. Blood pressure is well controlled.   Hyperlipidemia: Not previously treated. Most recent lipid profile found was on 11/13/2013 with LDL of 183. We'll check fasting lipid panel. Atorvastatin 40 mg has been initiated.  Tobacco use: The patient has quit smoking as of 4 weeks ago. I strongly advised her to continue with cessation.   Signed, Daune Perch, NP  12/13/2016, 12:04 PM    I have seen and examined the patient along with Daune Perch, NP .  I have reviewed the chart, notes and new data.  I agree with NP's note.  Key new complaints: no angina or dyspnea at rest at this time Key examination changes: no overt CHF, no arrhythmia Key new findings / data: cath results discussed  PLAN: Called for TCTS consultation and Dr. Cyndia Bent will see later today.  Sanda Klein, MD, [place] [phone] 12/13/2016, 1:11 PM

## 2016-12-13 NOTE — Plan of Care (Signed)
Problem: Education: Goal: Knowledge of [facility] Education information/materials will improve Outcome: Progressing POC reviewed with pt.   

## 2016-12-13 NOTE — Progress Notes (Signed)
ANTICOAGULATION CONSULT NOTE - Initial Consult  Pharmacy Consult for heparin  Indication: multivessel CAD  Allergies  Allergen Reactions  . Citalopram     REACTION: "feels wierd"  . Diclofenac Sodium     REACTION: GI Upset  . Fluoxetine Hcl Nausea Only  . Ibuprofen Nausea Only    Patient Measurements: Height: 5\' 2"  (157.5 cm) Weight: 157 lb 12.8 oz (71.6 kg) IBW/kg (Calculated) : 50.1 Heparin Dosing Weight: 65 kg  Vital Signs: Temp: 97.5 F (36.4 C) (07/19 0500) Temp Source: Oral (07/19 0500) BP: 122/85 (07/19 1312) Pulse Rate: 87 (07/19 0500)  Labs:  Recent Labs  12/12/16 1030  LABPROT 12.5  INR 0.94    Estimated Creatinine Clearance: 71.9 mL/min (by C-G formula based on SCr of 0.78 mg/dL).   Medical History: Past Medical History:  Diagnosis Date  . ABDOMINAL PAIN RIGHT LOWER QUADRANT 07/26/2009  . Abdominal pain, left lower quadrant 07/26/2009  . ABDOMINAL PAIN, LOWER 11/29/2009  . ANEMIA-B12 DEFICIENCY 07/11/2009  . ANEMIA-IRON DEFICIENCY 06/21/2009  . ANXIETY 02/06/2007  . Arthritis    "back" (12/12/2016)  . BACK PAIN 07/01/2007  . Benzodiazepine overdose 12/03/2011   July 2013  . CAD (coronary artery disease)    LHC 12/12/16: Multivessel CAD  . CHEST PAIN 06/21/2009  . DEPRESSION 02/06/2007  . [place] DISEASE, CERVICAL 05/03/2009  . DYSPEPSIA 10/23/2007  . EAR PAIN, RIGHT 11/29/2009  . ELBOW PAIN, RIGHT 06/27/2010  . ELEVATED BLOOD PRESSURE WITHOUT DIAGNOSIS OF HYPERTENSION 07/01/2007  . FEVER UNSPECIFIED 06/03/2008  . FREQUENCY, URINARY 03/12/2007  . Genital herpes 01/03/2011  . GERD 07/11/2009  . Glossitis 10/23/2007  . HEAD TRAUMA, CLOSED 11/29/2009  . History of surgery for cerebral aneurysm 2015  . HYPERLIPIDEMIA 02/06/2007  . Insomnia, unspecified 02/06/2007  . LATERAL EPICONDYLITIS, LEFT 06/03/2008  . LOW BACK PAIN 02/06/2007  . Major depression 11/16/2010  . MIGRAINE HEADACHE 02/06/2007   "used to get them all the time; stopped a few years ago" (12/12/2016)  .  OSTEOARTHRITIS 02/06/2007  . 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 OTITIS EXTERNA, RIGHT 10/04/2009  . Stroke [facility]) 2016-2017 X 3   RLE "goes to the side a little bit when I walk" (12/12/2016)  . VITAMIN B12 DEFICIENCY 07/26/2009    Medications:  Prescriptions Prior to Admission  Medication Sig Dispense Refill Last Dose  . aspirin EC 81 MG tablet Take 81 mg by mouth daily.   12/12/2016 at 0800  . atorvastatin (LIPITOR) 40 MG tablet Take 1 tablet (40 mg total) by mouth daily. 30 tablet 3 12/11/2016 at 2200  . furosemide (LASIX) 20 MG tablet TAKE 1 TABLET BY MOUTH EVERY DAY 30 tablet 1 12/11/2016 at Unknown time  . gabapentin (NEURONTIN) 300 MG capsule Take 300 mg by mouth 3 (three) times daily.   12/12/2016 at 0730  . Multiple Vitamin (MULTIVITAMIN WITH MINERALS) TABS tablet Take 1 tablet by mouth daily.   12/12/2016 at Unknown time  . PARoxetine (PAXIL) 40 MG tablet Take 40 mg by mouth daily.  2 12/12/2016 at Unknown time  . polyethylene glycol powder (GLYCOLAX/MIRALAX) powder Take 17 g by mouth 2 (two) times daily as needed. (Patient taking differently: Take 17 g by mouth daily. ) 3350 g 1 Past Week at Unknown time  . QUEtiapine (SEROQUEL) 200 MG tablet Take 200 mg by mouth 3 (three) times daily.    12/12/2016 at 0800  . ranitidine (ZANTAC) 150 MG tablet Take 1 tablet (150 mg total) by mouth 2 (two) times daily. 30 tablet  1 12/12/2016 at 0800  . venlafaxine XR (EFFEXOR-XR) 150 MG 24 hr capsule Take 300 mg by mouth daily.   12/12/2016 at 0800    Assessment: 56 y/o female s/p cath on 7/18 which showed 2V CAD - awaiting surgery consult. Pharmacy consulted to begin a heparin drip. No bleeding noted, CBC from 7/11 is normal.   Goal of Therapy:  Heparin level 0.3-0.7 units/ml Monitor platelets by anticoagulation protocol: Yes   Plan:  - Begin heparin drip at 900 units/hr with no bolus - 6 hr heparin level - Daily heparin level and CBC - Monitor for s/sx of bleeding   Renold Genta, PharmD,  BCPS Clinical Pharmacist Phone for today - [place] - [phone] 12/13/2016 1:47 PM

## 2016-12-13 NOTE — Care Management Note (Signed)
Case Management Note  Patient Details  Name: Michelle [name] MRN: [id number] Date of Birth: 01-28-1961  Subjective/Objective:  Pt presented for Chest Pressure and lower extremity edema. Post Cardiac Cath 12-12-16- Revealed Severe two vessel disease with both lesions being at significant bifurcations. Plan for CABG Monday. Pt continues on IV Heparin Gtt. Pt is from home with support of her mother and grandmother. Pt has DME [facility].                 Action/Plan: CM will continue to monitor for additional needs.   Expected Discharge Date:                  Expected Discharge Plan:  [place]  In-House Referral:  NA  Discharge planning Services  CM Consult  Post Acute Care Choice:    Choice offered to:     DME Arranged:    DME Agency:     HH Arranged:    HH Agency:     Status of Service:  In process, will continue to follow  If discussed at Long Length of Stay Meetings, dates discussed:    Additional Comments:  Bethena Roys, RN 12/13/2016, 3:24 PM

## 2016-12-13 NOTE — Progress Notes (Signed)
ANTICOAGULATION CONSULT NOTE - Initial Consult  Pharmacy Consult for heparin  Indication: multivessel CAD  Allergies  Allergen Reactions  . Citalopram     REACTION: "feels wierd"  . Diclofenac Sodium     REACTION: GI Upset  . Fluoxetine Hcl Nausea Only  . Ibuprofen Nausea Only    Patient Measurements: Height: 5\' 2"  (157.5 cm) Weight: 157 lb 12.8 oz (71.6 kg) IBW/kg (Calculated) : 50.1 Heparin Dosing Weight: 65 kg  Vital Signs: Temp: 98 F (36.7 C) (07/19 1518) Temp Source: Oral (07/19 1518) BP: 157/86 (07/19 1518) Pulse Rate: 96 (07/19 1518)  Labs:  Recent Labs  12/12/16 1030 12/13/16 2009  HGB  --  11.2*  HCT  --  36.7  PLT  --  355  LABPROT 12.5  --   INR 0.94  --   HEPARINUNFRC  --  0.32    Estimated Creatinine Clearance: 71.9 mL/min (by C-G formula based on SCr of 0.78 mg/dL).   Medical History: Past Medical History:  Diagnosis Date  . ABDOMINAL PAIN RIGHT LOWER QUADRANT 07/26/2009  . Abdominal pain, left lower quadrant 07/26/2009  . ABDOMINAL PAIN, LOWER 11/29/2009  . ANEMIA-B12 DEFICIENCY 07/11/2009  . ANEMIA-IRON DEFICIENCY 06/21/2009  . ANXIETY 02/06/2007  . Arthritis    "back" (12/12/2016)  . BACK PAIN 07/01/2007  . Benzodiazepine overdose 12/03/2011   July 2013  . CAD (coronary artery disease)    LHC 12/12/16: Multivessel CAD  . CHEST PAIN 06/21/2009  . DEPRESSION 02/06/2007  . [place] DISEASE, CERVICAL 05/03/2009  . DYSPEPSIA 10/23/2007  . EAR PAIN, RIGHT 11/29/2009  . ELBOW PAIN, RIGHT 06/27/2010  . ELEVATED BLOOD PRESSURE WITHOUT DIAGNOSIS OF HYPERTENSION 07/01/2007  . FEVER UNSPECIFIED 06/03/2008  . FREQUENCY, URINARY 03/12/2007  . Genital herpes 01/03/2011  . GERD 07/11/2009  . Glossitis 10/23/2007  . HEAD TRAUMA, CLOSED 11/29/2009  . History of surgery for cerebral aneurysm 2015  . HYPERLIPIDEMIA 02/06/2007  . Insomnia, unspecified 02/06/2007  . LATERAL EPICONDYLITIS, LEFT 06/03/2008  . LOW BACK PAIN 02/06/2007  . Major depression 11/16/2010  . MIGRAINE HEADACHE  02/06/2007   "used to get them all the time; stopped a few years ago" (12/12/2016)  . OSTEOARTHRITIS 02/06/2007  . 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 OTITIS EXTERNA, RIGHT 10/04/2009  . Stroke [facility]) 2016-2017 X 3   RLE "goes to the side a little bit when I walk" (12/12/2016)  . VITAMIN B12 DEFICIENCY 07/26/2009    Medications:  Prescriptions Prior to Admission  Medication Sig Dispense Refill Last Dose  . aspirin EC 81 MG tablet Take 81 mg by mouth daily.   12/12/2016 at 0800  . atorvastatin (LIPITOR) 40 MG tablet Take 1 tablet (40 mg total) by mouth daily. 30 tablet 3 12/11/2016 at 2200  . furosemide (LASIX) 20 MG tablet TAKE 1 TABLET BY MOUTH EVERY DAY 30 tablet 1 12/11/2016 at Unknown time  . gabapentin (NEURONTIN) 300 MG capsule Take 300 mg by mouth 3 (three) times daily.   12/12/2016 at 0730  . Multiple Vitamin (MULTIVITAMIN WITH MINERALS) TABS tablet Take 1 tablet by mouth daily.   12/12/2016 at Unknown time  . PARoxetine (PAXIL) 40 MG tablet Take 40 mg by mouth daily.  2 12/12/2016 at Unknown time  . polyethylene glycol powder (GLYCOLAX/MIRALAX) powder Take 17 g by mouth 2 (two) times daily as needed. (Patient taking differently: Take 17 g by mouth daily. ) 3350 g 1 Past Week at Unknown time  . QUEtiapine (SEROQUEL) 200 MG tablet Take 200 mg by  mouth 3 (three) times daily.    12/12/2016 at 0800  . ranitidine (ZANTAC) 150 MG tablet Take 1 tablet (150 mg total) by mouth 2 (two) times daily. 30 tablet 1 12/12/2016 at 0800  . venlafaxine XR (EFFEXOR-XR) 150 MG 24 hr capsule Take 300 mg by mouth daily.   12/12/2016 at 0800    Assessment: 56 y/o female s/p cath on 7/18 which showed 2V CAD, awaiting CABG 7/23, continue on IV heparin. Heparin level 0.32, low end therapeutic on 900 units/hr, hgb 11.2 stable, pltc wnl.   Goal of Therapy:  Heparin level 0.3-0.7 units/ml Monitor platelets by anticoagulation protocol: Yes   Plan:  - Increase heparin rate slightly to 950 units/hr to stay in goal  range - f/u AM heparin level - Daily heparin level and CBC - Monitor for s/sx of bleeding   Maryanna Shape, PharmD, BCPS  Clinical Pharmacist  Pager: [phone]   12/13/2016 9:11 PM

## 2016-12-13 NOTE — Progress Notes (Signed)
CARDIAC REHAB PHASE I   PRE:  Rate/Rhythm: 101 ST  BP:  Sitting: 126/86        SaO2: 100 RA  MODE:  Ambulation: 350 ft   POST:  Rate/Rhythm: 98 SR  BP:  Sitting: 157/86         SaO2: 98 RA  Pt anxious, thinks she is going home, eager to speak with the surgeon, daughter at bedside, emotional support provided. Pt spoke with surgeon, states she feels much better, agrees that she will be staying in the hospital until surgery. Pt agreeable to walk, states she walked earlier today as well. Pt ambulated 350 ft on RA, IV, assist x1, mostly steady gait, tolerated fairly well. Pt denies any complaints other than some fatigue with distance. Left cardiac surgery booklet and cardiac surgery guidelines, pt states she would prefer to discuss pre-op education tomorrow. Pt to recliner after walk, call bell within reach. Will follow.   [id number] [name], RN, BSN 12/13/2016 3:21 PM

## 2016-12-14 ENCOUNTER — Encounter: Payer: Self-pay | Admitting: Internal Medicine

## 2016-12-14 ENCOUNTER — Inpatient Hospital Stay (HOSPITAL_COMMUNITY): Payer: BLUE CROSS/BLUE SHIELD

## 2016-12-14 DIAGNOSIS — E785 Hyperlipidemia, unspecified: Secondary | ICD-10-CM

## 2016-12-14 DIAGNOSIS — I10 Essential (primary) hypertension: Secondary | ICD-10-CM

## 2016-12-14 DIAGNOSIS — Z720 Tobacco use: Secondary | ICD-10-CM

## 2016-12-14 DIAGNOSIS — I25110 Atherosclerotic heart disease of native coronary artery with unstable angina pectoris: Principal | ICD-10-CM

## 2016-12-14 DIAGNOSIS — Z01810 Encounter for preprocedural cardiovascular examination: Secondary | ICD-10-CM

## 2016-12-14 LAB — CBC
HCT: 37.2 % (ref 36.0–46.0)
HEMOGLOBIN: 11.6 g/dL — AB (ref 12.0–15.0)
MCH: 28.6 pg (ref 26.0–34.0)
MCHC: 31.2 g/dL (ref 30.0–36.0)
MCV: 91.9 fL (ref 78.0–100.0)
PLATELETS: 344 10*3/uL (ref 150–400)
RBC: 4.05 MIL/uL (ref 3.87–5.11)
RDW: 14.8 % (ref 11.5–15.5)
WBC: 8.9 10*3/uL (ref 4.0–10.5)

## 2016-12-14 LAB — VAS US DOPPLER PRE CABG
LCCAPDIAS: 23 cm/s
LCCAPSYS: 71 cm/s
LEFT ECA DIAS: -17 cm/s
LEFT VERTEBRAL DIAS: -16 cm/s
LICAPDIAS: -24 cm/s
LICAPSYS: -77 cm/s
Left CCA dist dias: -34 cm/s
Left CCA dist sys: -84 cm/s
RCCAPDIAS: 28 cm/s
RCCAPSYS: 78 cm/s
RIGHT ECA DIAS: -28 cm/s
RIGHT VERTEBRAL DIAS: -14 cm/s
Right cca dist sys: -82 cm/s

## 2016-12-14 LAB — BASIC METABOLIC PANEL
ANION GAP: 10 (ref 5–15)
BUN: 9 mg/dL (ref 6–20)
CALCIUM: 9.8 mg/dL (ref 8.9–10.3)
CO2: 26 mmol/L (ref 22–32)
Chloride: 106 mmol/L (ref 101–111)
Creatinine, Ser: 0.8 mg/dL (ref 0.44–1.00)
GLUCOSE: 84 mg/dL (ref 65–99)
POTASSIUM: 3.8 mmol/L (ref 3.5–5.1)
Sodium: 142 mmol/L (ref 135–145)

## 2016-12-14 LAB — SURGICAL PCR SCREEN
MRSA, PCR: NEGATIVE
Staphylococcus aureus: POSITIVE — AB

## 2016-12-14 LAB — LIPID PANEL
CHOL/HDL RATIO: 3.4 ratio
CHOLESTEROL: 173 mg/dL (ref 0–200)
HDL: 51 mg/dL (ref >40)
LDL Cholesterol: 87 mg/dL (ref 0–99)
TRIGLYCERIDES: 173 mg/dL — AB (ref <150)
VLDL: 35 mg/dL (ref 0–40)

## 2016-12-14 LAB — HEPARIN LEVEL (UNFRACTIONATED): Heparin Unfractionated: 0.62 IU/mL (ref 0.30–0.70)

## 2016-12-14 MED ORDER — SENNOSIDES-DOCUSATE SODIUM 8.6-50 MG PO TABS
1.0000 | ORAL_TABLET | Freq: Two times a day (BID) | ORAL | Status: DC | PRN
  Administered 2016-12-14 – 2016-12-16 (×3): 1 via ORAL
  Filled 2016-12-14 (×3): qty 1

## 2016-12-14 NOTE — Progress Notes (Signed)
Progress Note  Patient Name: Michelle [name] Date of Encounter: 12/14/2016  Primary Cardiologist: New- Dr. Acie Fredrickson  Subjective   The patient continues to be chest pain-free without dyspnea. Improved breathing since admission. No orthopnea last night. Right hand pain is improved. Right wrist cath site is without hematoma.  Inpatient Medications    Scheduled Meds: . aspirin EC  81 mg Oral Daily  . atorvastatin  40 mg Oral QHS  . famotidine  20 mg Oral QHS  . gabapentin  300 mg Oral TID  . metoprolol tartrate  12.5 mg Oral BID  . multivitamin with minerals  1 tablet Oral Daily  . PARoxetine  40 mg Oral Daily  . polyethylene glycol  17 g Oral Daily  . QUEtiapine  200 mg Oral TID  . sodium chloride flush  3 mL Intravenous Q12H  . venlafaxine XR  300 mg Oral Daily   Continuous Infusions: . sodium chloride    . heparin 950 Units/hr (12/13/16 2212)   PRN Meds: sodium chloride, acetaminophen, HYDROcodone-acetaminophen, nitroGLYCERIN, ondansetron (ZOFRAN) IV, sodium chloride flush   Vital Signs    Vitals:   12/13/16 1518 12/13/16 2143 12/14/16 0603 12/14/16 0817  BP: (!) 157/86 130/72 129/88 127/81  Pulse: 96 85 85   Resp: 18 18 16    Temp: 98 F (36.7 C) 97.8 F (36.6 C) 98.2 F (36.8 C)   TempSrc: Oral Oral Oral   SpO2: 98% 95% 96%   Weight:   151 lb 12.8 oz (68.9 kg)   Height:        Intake/Output Summary (Last 24 hours) at 12/14/16 0859 Last data filed at 12/14/16 0700  Gross per 24 hour  Intake            879.2 ml  Output              600 ml  Net            279.2 ml   Filed Weights   12/12/16 0909 12/13/16 0500 12/14/16 0603  Weight: 158 lb (71.7 kg) 157 lb 12.8 oz (71.6 kg) 151 lb 12.8 oz (68.9 kg)    Telemetry   Sinus rhythm with rates in the 70s to 80s- Personally Reviewed  ECG    No recent tracings  Physical Exam   Physical Exam  Constitutional: She is oriented to person, place, and time. She appears well-developed and well-nourished. No  distress.  HENT:  Head: Normocephalic and atraumatic.  Neck: Normal range of motion. Neck supple. No JVD present.  Cardiovascular: Normal rate, regular rhythm and normal heart sounds.  Exam reveals no gallop and no friction rub.   No murmur heard. Pulmonary/Chest: Effort normal and breath sounds normal.  Mildly decreased in the bases  Abdominal: Soft. Bowel sounds are normal. She exhibits no distension. There is no tenderness.  Musculoskeletal: Normal range of motion. She exhibits no edema or deformity.  Neurological: She is alert and oriented to person, place, and time.  Skin: Skin is warm and dry.  Psychiatric: She has a normal mood and affect. Her behavior is normal.    Radiology    No results found.  Cardiac Studies   Left Heart Cath and Coronary Angiography 12/12/16  Conclusion    Mid RCA lesion, 60 %stenosed.  RPDA lesion, 95 %stenosed.  Dist RCA lesion, 95 %stenosed.  Post Atrio lesion, 95 %stenosed.  Mid LAD lesion, 80 %stenosed.  Ost 2nd Diag to 2nd Diag lesion, 75 %stenosed.  The left ventricular systolic  function is normal.  LV end diastolic pressure is normal.  The left ventricular ejection fraction is 55-65% by visual estimate.  There is no aortic valve stenosis.   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   It is reasonable to consider CABG given the bifurcation disease, although there are percutaneous options as well.  I think a consult is warranted.  She has had some rest pain.  Will admit to inpatient service.  Start heparin 6 hours post sheath pull.      Echocardiogram 12/12/16 Study Conclusions  - Left ventricle: The cavity size was normal. There was mild focal   basal hypertrophy of the septum. Systolic function was normal.   The estimated ejection fraction was in the range of 60% to 65%.   Wall motion was normal; there were no regional wall motion   abnormalities. Doppler parameters are consistent  with abnormal   left ventricular relaxation (grade 1 diastolic dysfunction). - Mitral valve: There was trivial regurgitation. - Tricuspid valve: There was trivial regurgitation.  Patient Profile     56 y.o. female  with PMH of HTN (not on any medication currently), CVA, seizure, cerebral aneurysm and HLD (not on any medications) and ongoing tobacco abuse who presented for evaluation of elevated BNP and edema. Pt also had exertional chest pressure and dyspnea relieved with rest. Her edema improved with Lasix. She was scheduled for cardiac cath.   Assessment & Plan    CAD: Patient with symptoms of exertional chest pressure and dyspnea and lower extremity edema along with elevated BNP.  Cardiac cath 7/18 found severe two-vessel disease with both lesions being at significant bifurcations. See full report above. Echocardiogram showed normal LV function with EF 60-65%, no regional wall motion abnormalities, grade 1 diastolic dysfunction Currently chest pain-free. Cardiac surgery referral is made and the patient was seen by Dr. Cyndia Bent yesterday who recommends CABG tentatively scheduled for Monday. We'll continue IV heparin until surgery. Continue aspirin, statin, beta blocker. Patient has normal electrolytes and kidney function on review of today's labs. Decreased breath sounds in the lung bases. Patient advised to use incentive spirometer prior to her surgery.  Lower extremity edema and shortness of breath: Symptoms improved with Lasix. Echo showed normal LV systolic function with EF 60-65%.  Hypertension: Not previously treated. Blood pressure is well controlled and stable with the addition of beta blocker.   Hyperlipidemia: Not previously treated. Most recent lipid profile found was on 11/13/2013 with LDL of 183. Lipid panel checked on 12/14/16 showing a total cholesterol of 173 and an LDL of 87. Atorvastatin 40 mg has been initiated. Target LDL will be < 70.  Tobacco use: The patient has quit smoking  as of 4 weeks ago. Advised on continued cessation.   Signed, Daune Perch, NP  12/14/2016, 8:59 AM    I have seen and examined the patient along with Daune Perch, NP .  I have reviewed the chart, notes and new data.  I agree with NP's note.  Key new complaints: currently angina free, on IV heparin Key examination changes: no overt hypervolemia, no arrhythmia Key new findings / data: angiograms reviewed  PLAN: For CABG on Monday. On IV heparin until then.  Sanda Klein, MD, [place] [phone] 12/14/2016, 11:05 AM

## 2016-12-14 NOTE — Progress Notes (Signed)
CARDIAC REHAB PHASE I   PRE:  Rate/Rhythm: 78 SR  BP:  Sitting: 103/74        SaO2: 93 RA  MODE:  Ambulation: 520 ft   POST:  Rate/Rhythm: 92 SR  BP:  Sitting: 91/66         SaO2: 99 RA  Pt ambulated 520 ft on RA, IV, assist x1, mostly steady gait, tolerated well. Pt c/o some DOE, some fatigue with distance, declined rest stop. Cardiac surgery pre-op education completed. Reviewed IS, sternal precautions, activity progression, cardiac surgery booklet and cardiac surgery guidelines. Pt verbalized understanding, declined pre-op videos. Pt to chair after walk, call bell within reach. Will follow.   [phone] [name], RN, BSN 12/14/2016 11:52 AM

## 2016-12-14 NOTE — Progress Notes (Signed)
Per patient has not had a bowel movement since Saturday.  Patient has been taking scheduled Miralax with no results.  Cardiology text paged with this information.

## 2016-12-14 NOTE — Plan of Care (Signed)
Problem: Education: Goal: Knowledge of [facility] Education information/materials will improve Outcome: Progressing Patient aware of plan of care.  RN provided medication education on all medications administered thus far this shift.  Patient stated understanding.  Patient denies pain thus far.

## 2016-12-14 NOTE — Progress Notes (Signed)
[place] for heparin  Indication: multivessel CAD  Allergies  Allergen Reactions  . Citalopram     REACTION: "feels wierd"  . Diclofenac Sodium     REACTION: GI Upset  . Fluoxetine Hcl Nausea Only  . Ibuprofen Nausea Only    Patient Measurements: Height: 5\' 2"  (157.5 cm) Weight: 151 lb 12.8 oz (68.9 kg) IBW/kg (Calculated) : 50.1 Heparin Dosing Weight: 65 kg  Vital Signs: Temp: 98.2 F (36.8 C) (07/20 0603) Temp Source: Oral (07/20 0603) BP: 127/81 (07/20 0817) Pulse Rate: 85 (07/20 0603)  Labs:  Recent Labs  12/12/16 1030 12/13/16 2009 12/14/16 0240  HGB  --  11.2* 11.6*  HCT  --  36.7 37.2  PLT  --  355 344  LABPROT 12.5  --   --   INR 0.94  --   --   HEPARINUNFRC  --  0.32 0.62  CREATININE  --   --  0.80    Estimated Creatinine Clearance: 70.6 mL/min (by C-G formula based on SCr of 0.8 mg/dL).  Assessment: 56 y/o female s/p cath on 7/18 which showed 2V CAD, awaiting CABG 7/23, continue on IV heparin.   Heparin level 0.6, cbc remains stable. No bleeding issues noted.  Goal of Therapy:  Heparin level 0.3-0.7 units/ml Monitor platelets by anticoagulation protocol: Yes   Plan:  - Continue heparin rate at 950 units/hr - Daily heparin level and CBC - Monitor for s/sx of bleeding   Erin Hearing PharmD., BCPS Clinical Pharmacist Pager [phone] 12/14/2016 11:28 AM

## 2016-12-14 NOTE — Progress Notes (Signed)
Pre-op Cardiac Surgery  Carotid Findings:  Bilateral:  1-39% ICA stenosis.  Vertebral artery flow is antegrade.     Upper Extremity Right Left  Brachial Pressures 118 Triphasic 123 Triphasic  Radial Waveforms Triphasic Triphasic  Ulnar Waveforms Triphasic Triphasic  Palmar Arch (Allen's Test) Normal Normal   Findings:  Doppler waveforms remained normal bilaterally at rest.    Lower  Extremity Right Left  Dorsalis Pedis 123 Triphasic 143 Triphasic  Posterior Tibial 146 Triphasic 143 Triphasic  Ankle/Brachial Indices 1.19 1.16    Findings:  ABIs and Doppler waveforms indicate normal arterial flow at rest.  Michelle [name], [place]  12/14/16,  01:10 PM

## 2016-12-14 NOTE — Plan of Care (Signed)
Problem: Safety: Goal: Ability to remain free from injury will improve Outcome: Progressing Per RN fall risk assessment patient moderate risk for falls.  RN instructed patient to call and wait for staff assistance prior to getting out of bed.  Patient stated understanding.  Once, thus far this shift RN responded to sounding bed alarm and patient in the process of getting out of bed to go to the bathroom.  RN assisted patient and re-educated patient on importance of calling for staff to assist prior to patient getting out of bed.  Patient stated understanding.

## 2016-12-15 LAB — HEPARIN LEVEL (UNFRACTIONATED)
HEPARIN UNFRACTIONATED: 0.69 [IU]/mL (ref 0.30–0.70)
Heparin Unfractionated: 0.88 IU/mL — ABNORMAL HIGH (ref 0.30–0.70)

## 2016-12-15 LAB — CBC
HCT: 38.2 % (ref 36.0–46.0)
Hemoglobin: 12 g/dL (ref 12.0–15.0)
MCH: 29 pg (ref 26.0–34.0)
MCHC: 31.4 g/dL (ref 30.0–36.0)
MCV: 92.3 fL (ref 78.0–100.0)
PLATELETS: 348 10*3/uL (ref 150–400)
RBC: 4.14 MIL/uL (ref 3.87–5.11)
RDW: 14.8 % (ref 11.5–15.5)
WBC: 9.2 10*3/uL (ref 4.0–10.5)

## 2016-12-15 LAB — BASIC METABOLIC PANEL
ANION GAP: 9 (ref 5–15)
BUN: 13 mg/dL (ref 6–20)
CALCIUM: 9.3 mg/dL (ref 8.9–10.3)
CO2: 25 mmol/L (ref 22–32)
CREATININE: 0.75 mg/dL (ref 0.44–1.00)
Chloride: 105 mmol/L (ref 101–111)
Glucose, Bld: 87 mg/dL (ref 65–99)
Potassium: 3.7 mmol/L (ref 3.5–5.1)
SODIUM: 139 mmol/L (ref 135–145)

## 2016-12-15 MED ORDER — MUPIROCIN 2 % EX OINT
1.0000 "application " | TOPICAL_OINTMENT | Freq: Two times a day (BID) | CUTANEOUS | Status: DC
  Administered 2016-12-15 – 2016-12-16 (×3): 1 via NASAL
  Filled 2016-12-15 (×2): qty 22

## 2016-12-15 MED ORDER — CHLORHEXIDINE GLUCONATE CLOTH 2 % EX PADS
6.0000 | MEDICATED_PAD | Freq: Every day | CUTANEOUS | Status: DC
  Administered 2016-12-16: 6 via TOPICAL

## 2016-12-15 MED ORDER — CHLORHEXIDINE GLUCONATE CLOTH 2 % EX PADS: 6.0000 | MEDICATED_PAD | Freq: Every day | CUTANEOUS | Status: DC

## 2016-12-15 NOTE — Plan of Care (Signed)
Problem: Activity: Goal: Risk for activity intolerance will decrease Outcome: Progressing Patient to and from bathroom with staff assist of 1.  No shortness of breath noted per RN.

## 2016-12-15 NOTE — Progress Notes (Signed)
ANTICOAGULATION CONSULT NOTE - Follow Up Consult  Pharmacy Consult for Heparin Indication: multi-vessel CAD  Allergies  Allergen Reactions  . Citalopram Other (See Comments)    "Feels wierd"  . Diclofenac Sodium Nausea Only    GI Upset  . Fluoxetine Hcl Nausea Only  . Ibuprofen Nausea Only    Patient Measurements: Height: 5\' 2"  (157.5 cm) Weight: 150 lb 14.4 oz (68.4 kg) IBW/kg (Calculated) : 50.1 Heparin Dosing Weight: 65 kg  Vital Signs: Temp: 98.4 F (36.9 C) (07/21 0447) Temp Source: Oral (07/21 0447) BP: 110/79 (07/21 0845) Pulse Rate: 88 (07/21 0447)  Labs:  Recent Labs  12/13/16 2009 12/14/16 0240 12/15/16 0211 12/15/16 1156  HGB 11.2* 11.6* 12.0  --   HCT 36.7 37.2 38.2  --   PLT 355 344 348  --   HEPARINUNFRC 0.32 0.62 0.88* 0.69  CREATININE  --  0.80 0.75  --     Estimated Creatinine Clearance: 70.3 mL/min (by C-G formula based on SCr of 0.75 mg/dL).  Assessment: 23 yoF s/p cath on 7/18, showed 2 vessel CAD, awaiting CABG 7/23. Pharmacy consulted to dose heparin.   Heparin level at 1200 was therapeutic, 0.69. CBC stable/ no bleeding noted.  Goal of Therapy:  Heparin level 0.3-0.7 units/ml Monitor platelets by anticoagulation protocol: Yes   Plan:  Continue heparin drip 850 units/hour Daily heparin level and CBC  Bridgett Larsson, PharmD, MS PGY1 Pharmacy Resident 12/15/2016,2:24 PM

## 2016-12-15 NOTE — Progress Notes (Signed)
[place] for heparin  Indication: multivessel CAD  Allergies  Allergen Reactions  . Citalopram Other (See Comments)    "Feels wierd"  . Diclofenac Sodium Nausea Only    GI Upset  . Fluoxetine Hcl Nausea Only  . Ibuprofen Nausea Only   Patient Measurements: Height: 5\' 2"  (157.5 cm) Weight: 151 lb 12.8 oz (68.9 kg) IBW/kg (Calculated) : 50.1 Heparin Dosing Weight: 65 kg  Vital Signs: Temp: 98.3 F (36.8 C) (07/20 2037) Temp Source: Oral (07/20 2037) BP: 102/68 (07/20 2143) Pulse Rate: 89 (07/20 2037)  Labs:  Recent Labs  12/12/16 1030  12/13/16 2009 12/14/16 0240 12/15/16 0211  HGB  --   < > 11.2* 11.6* 12.0  HCT  --   --  36.7 37.2 38.2  PLT  --   --  355 344 348  LABPROT 12.5  --   --   --   --   INR 0.94  --   --   --   --   HEPARINUNFRC  --   --  0.32 0.62 0.88*  CREATININE  --   --   --  0.80 0.75  < > = values in this interval not displayed.  Estimated Creatinine Clearance: 70.6 mL/min (by C-G formula based on SCr of 0.75 mg/dL).  Assessment: 56 y/o female s/p cath on 7/18 which showed 2V CAD, awaiting CABG 7/23, continue on IV heparin.   Heparin level 0.88, CBC stable, no bleeding per RN  Goal of Therapy:  Heparin level 0.3-0.7 units/ml Monitor platelets by anticoagulation protocol: Yes   Plan:  - Reduce heparin gtt to 850 units/hr - F/U 8 hour heparin level - Daily heparin level and CBC - Monitor for s/sx of bleeding   Georga Bora, PharmD Clinical Pharmacist 12/15/2016 3:45 AM

## 2016-12-15 NOTE — Plan of Care (Signed)
Problem: Skin Integrity: Goal: Risk for impaired skin integrity will decrease Outcome: Not Applicable Date Met: 99/80/01 [place] greater than 18.  Patient able to turn self.

## 2016-12-15 NOTE — Progress Notes (Signed)
Progress Note  Patient Name: Michelle [name] Date of Encounter: 12/15/2016  Primary Cardiologist: Dr. Acie Fredrickson  Subjective   No recurrent chest pain or palpitations. Has ambulated around the unit without difficulty.  Anxious about upcoming surgery on Monday.   Inpatient Medications    Scheduled Meds: . aspirin EC  81 mg Oral Daily  . atorvastatin  40 mg Oral QHS  . famotidine  20 mg Oral QHS  . gabapentin  300 mg Oral TID  . metoprolol tartrate  12.5 mg Oral BID  . multivitamin with minerals  1 tablet Oral Daily  . PARoxetine  40 mg Oral Daily  . polyethylene glycol  17 g Oral Daily  . QUEtiapine  200 mg Oral TID  . sodium chloride flush  3 mL Intravenous Q12H  . venlafaxine XR  300 mg Oral Daily   Continuous Infusions: . sodium chloride    . heparin 850 Units/hr (12/15/16 0345)   PRN Meds: sodium chloride, acetaminophen, HYDROcodone-acetaminophen, nitroGLYCERIN, ondansetron (ZOFRAN) IV, senna-docusate, sodium chloride flush   Vital Signs    Vitals:   12/14/16 2037 12/14/16 2143 12/15/16 0447 12/15/16 0845  BP: 97/63 102/68 90/63 110/79  Pulse: 89  88   Resp: 16  16   Temp: 98.3 F (36.8 C)  98.4 F (36.9 C)   TempSrc: Oral  Oral   SpO2: 97%  97%   Weight:   150 lb 14.4 oz (68.4 kg)   Height:        Intake/Output Summary (Last 24 hours) at 12/15/16 1025 Last data filed at 12/15/16 0800  Gross per 24 hour  Intake          1030.76 ml  Output              650 ml  Net           380.76 ml   Filed Weights   12/13/16 0500 12/14/16 0603 12/15/16 0447  Weight: 157 lb 12.8 oz (71.6 kg) 151 lb 12.8 oz (68.9 kg) 150 lb 14.4 oz (68.4 kg)    Telemetry    NSR, HR in 70's - 90's. No ectopic events. - Personally Reviewed  ECG    No new tracings.   Physical Exam   General: Well developed, well nourished, female appearing in no acute distress. Head: Normocephalic, atraumatic.  Neck: Supple without bruits, JVD not elevated. Lungs:  Resp regular and unlabored,  CTA without wheezing or rales. Heart: RRR, S1, S2, no S3, S4, or murmur; no rub. Abdomen: Soft, non-tender, non-distended with normoactive bowel sounds. No hepatomegaly. No rebound/guarding. No obvious abdominal masses. Extremities: No clubbing, cyanosis, or lower extremity edema. Distal pedal pulses are 2+ bilaterally. Neuro: Alert and oriented X 3. Moves all extremities spontaneously. Psych: Normal affect.  Labs    Chemistry Recent Labs Lab 12/14/16 0240 12/15/16 0211  NA 142 139  K 3.8 3.7  CL 106 105  CO2 26 25  GLUCOSE 84 87  BUN 9 13  CREATININE 0.80 0.75  CALCIUM 9.8 9.3  GFRNONAA >60 >60  GFRAA >60 >60  ANIONGAP 10 9     Hematology Recent Labs Lab 12/13/16 2009 12/14/16 0240 12/15/16 0211  WBC 10.7* 8.9 9.2  RBC 4.00 4.05 4.14  HGB 11.2* 11.6* 12.0  HCT 36.7 37.2 38.2  MCV 91.8 91.9 92.3  MCH 28.0 28.6 29.0  MCHC 30.5 31.2 31.4  RDW 14.9 14.8 14.8  PLT 355 344 348    Cardiac EnzymesNo results for input(s): TROPONINI in  the last 168 hours. No results for input(s): TROPIPOC in the last 168 hours.   BNPNo results for input(s): BNP, PROBNP in the last 168 hours.   DDimer No results for input(s): DDIMER in the last 168 hours.   Radiology    No results found.  Cardiac Studies   Cardiac Catheterization: 12/12/2016  Mid RCA lesion, 60 %stenosed.  RPDA lesion, 95 %stenosed.  Dist RCA lesion, 95 %stenosed.  Post Atrio lesion, 95 %stenosed.  Mid LAD lesion, 80 %stenosed.  Ost 2nd Diag to 2nd Diag lesion, 75 %stenosed.  The left ventricular systolic function is normal.  LV end diastolic pressure is normal.  The left ventricular ejection fraction is 55-65% by visual estimate.  There is no aortic valve stenosis.   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   It is reasonable to consider CABG given the bifurcation disease, although there are percutaneous options as well.  I think a consult is warranted.  She  has had some rest pain.  Will admit to inpatient service.  Start heparin 6 hours post sheath pull.  Patient Profile     56 y.o. female w/ PMH of HTN, HLD, seizure disorder, and cerebral aneurysm who presented to [facility] on 12/12/2016 for planned cardiac cath in the setting of chest pain and dyspnea on exertion. Cath showed severe 2-vessel disease with lesions at significant bifurications. CABG recommended.   Assessment & Plan    1. Multivessel CAD - Patient reported symptoms of exertional chest pressure and dyspnea on exertion with cath showing severe two-vessel disease with both lesions being at significant bifurcations as above.  - CT Surgery has been consulted with CABG recommended which has tentatively been scheduled for Monday. Continue ASA, statin, beta blocker, and Heparin.  2. Lower extremity edema and shortness of breath - Symptoms improved with Lasix. Echo showed normal LV systolic function with EF 60-65%. - she does not appear volume overloaded by physical examination.   3. Hypertension - BP has been well-controlled, last reading at 110/79. - continue Lopressor 12.5mg  BID.   4. Hyperlipidemia - Lipid panel this admission shows total cholesterol of 173 and an LDL of 87. Atorvastatin 40 mg has been initiated. Target LDL is < 70 with known CAD.  5. Tobacco use - she quit smoking 4 weeks prior to admission. Continued cessation advised.   Arna Medici , PA-C 10:25 AM 12/15/2016 Pager: [phone]  Patient seen and examined.  Questions answered about surgery.  No ischemic chest pain overnight.  Bypass grafting tentatively planned on Monday.  Above note read and agree with exam.  Lungs clear, no S3  W. Tollie Eth, Brooke Bonito. MD [facility] 11:20 AM

## 2016-12-16 ENCOUNTER — Inpatient Hospital Stay (HOSPITAL_COMMUNITY): Payer: BLUE CROSS/BLUE SHIELD

## 2016-12-16 LAB — URINALYSIS, ROUTINE W REFLEX MICROSCOPIC
BILIRUBIN URINE: NEGATIVE
Glucose, UA: NEGATIVE mg/dL
Hgb urine dipstick: NEGATIVE
KETONES UR: NEGATIVE mg/dL
Leukocytes, UA: NEGATIVE
NITRITE: NEGATIVE
PH: 6 (ref 5.0–8.0)
Protein, ur: NEGATIVE mg/dL
SPECIFIC GRAVITY, URINE: 1.015 (ref 1.005–1.030)

## 2016-12-16 LAB — BLOOD GAS, ARTERIAL
ACID-BASE DEFICIT: 0.1 mmol/L (ref 0.0–2.0)
Bicarbonate: 23.7 mmol/L (ref 20.0–28.0)
DRAWN BY: 280981
FIO2: 0.21
O2 SAT: 95.8 %
PATIENT TEMPERATURE: 98.6
PCO2 ART: 36 mmHg (ref 32.0–48.0)
PO2 ART: 76.4 mmHg — AB (ref 83.0–108.0)
pH, Arterial: 7.434 (ref 7.350–7.450)

## 2016-12-16 LAB — BASIC METABOLIC PANEL
Anion gap: 8 (ref 5–15)
BUN: 16 mg/dL (ref 6–20)
CALCIUM: 8.9 mg/dL (ref 8.9–10.3)
CO2: 26 mmol/L (ref 22–32)
CREATININE: 0.76 mg/dL (ref 0.44–1.00)
Chloride: 106 mmol/L (ref 101–111)
GLUCOSE: 82 mg/dL (ref 65–99)
Potassium: 3.9 mmol/L (ref 3.5–5.1)
Sodium: 140 mmol/L (ref 135–145)

## 2016-12-16 LAB — CBC
HCT: 36.9 % (ref 36.0–46.0)
Hemoglobin: 11.5 g/dL — ABNORMAL LOW (ref 12.0–15.0)
MCH: 28.4 pg (ref 26.0–34.0)
MCHC: 31.2 g/dL (ref 30.0–36.0)
MCV: 91.1 fL (ref 78.0–100.0)
PLATELETS: 365 10*3/uL (ref 150–400)
RBC: 4.05 MIL/uL (ref 3.87–5.11)
RDW: 14.9 % (ref 11.5–15.5)
WBC: 8.7 10*3/uL (ref 4.0–10.5)

## 2016-12-16 LAB — ABO/RH: ABO/RH(D): A POS

## 2016-12-16 LAB — HEPARIN LEVEL (UNFRACTIONATED): HEPARIN UNFRACTIONATED: 0.62 [IU]/mL (ref 0.30–0.70)

## 2016-12-16 LAB — APTT: aPTT: 79 seconds — ABNORMAL HIGH (ref 24–36)

## 2016-12-16 MED ORDER — TRANEXAMIC ACID 1000 MG/10ML IV SOLN
1.5000 mg/kg/h | INTRAVENOUS | Status: AC
  Administered 2016-12-17: 1.5 mg/kg/h via INTRAVENOUS
  Filled 2016-12-16: qty 25

## 2016-12-16 MED ORDER — TRANEXAMIC ACID (OHS) BOLUS VIA INFUSION
15.0000 mg/kg | INTRAVENOUS | Status: DC
  Filled 2016-12-16: qty 1026

## 2016-12-16 MED ORDER — POTASSIUM CHLORIDE 2 MEQ/ML IV SOLN
80.0000 meq | INTRAVENOUS | Status: DC
  Filled 2016-12-16 (×2): qty 40

## 2016-12-16 MED ORDER — DEXTROSE 5 % IV SOLN
750.0000 mg | INTRAVENOUS | Status: DC
  Filled 2016-12-16: qty 750

## 2016-12-16 MED ORDER — MAGNESIUM SULFATE 50 % IJ SOLN
40.0000 meq | INTRAMUSCULAR | Status: DC
  Filled 2016-12-16: qty 10

## 2016-12-16 MED ORDER — SODIUM CHLORIDE 0.9 % IV SOLN
INTRAVENOUS | Status: DC
  Filled 2016-12-16: qty 30

## 2016-12-16 MED ORDER — EPINEPHRINE PF 1 MG/ML IJ SOLN
0.0000 ug/min | INTRAVENOUS | Status: DC
  Filled 2016-12-16: qty 4

## 2016-12-16 MED ORDER — SODIUM CHLORIDE 0.9 % IV SOLN
INTRAVENOUS | Status: AC
  Administered 2016-12-17: .4 [IU]/h via INTRAVENOUS
  Administered 2016-12-17: .8 [IU]/h via INTRAVENOUS
  Filled 2016-12-16: qty 1

## 2016-12-16 MED ORDER — TEMAZEPAM 15 MG PO CAPS
15.0000 mg | ORAL_CAPSULE | Freq: Once | ORAL | Status: DC | PRN
Start: 2016-12-16 — End: 2016-12-17

## 2016-12-16 MED ORDER — CEFUROXIME SODIUM 1.5 G IV SOLR
1.5000 g | INTRAVENOUS | Status: AC
  Administered 2016-12-17: .75 g via INTRAVENOUS
  Administered 2016-12-17: 1.5 g via INTRAVENOUS
  Filled 2016-12-16: qty 1.5

## 2016-12-16 MED ORDER — CHLORHEXIDINE GLUCONATE CLOTH 2 % EX PADS: 6.0000 | MEDICATED_PAD | Freq: Once | CUTANEOUS | Status: DC

## 2016-12-16 MED ORDER — NITROGLYCERIN IN D5W 200-5 MCG/ML-% IV SOLN
2.0000 ug/min | INTRAVENOUS | Status: AC
  Administered 2016-12-17: 10 ug/min via INTRAVENOUS
  Filled 2016-12-16 (×2): qty 250

## 2016-12-16 MED ORDER — CHLORHEXIDINE GLUCONATE 0.12 % MT SOLN
15.0000 mL | Freq: Once | OROMUCOSAL | Status: AC
  Administered 2016-12-17: 15 mL via OROMUCOSAL
  Filled 2016-12-16: qty 15

## 2016-12-16 MED ORDER — DEXMEDETOMIDINE HCL IN NACL 400 MCG/100ML IV SOLN
0.1000 ug/kg/h | INTRAVENOUS | Status: AC
  Administered 2016-12-17: .5 ug/kg/h via INTRAVENOUS
  Filled 2016-12-16: qty 100

## 2016-12-16 MED ORDER — TRANEXAMIC ACID (OHS) PUMP PRIME SOLUTION
2.0000 mg/kg | INTRAVENOUS | Status: DC
  Filled 2016-12-16: qty 1.37

## 2016-12-16 MED ORDER — DIAZEPAM 5 MG PO TABS
5.0000 mg | ORAL_TABLET | Freq: Once | ORAL | Status: AC
  Administered 2016-12-17: 5 mg via ORAL
  Filled 2016-12-16: qty 1

## 2016-12-16 MED ORDER — PHENYLEPHRINE HCL 10 MG/ML IJ SOLN
30.0000 ug/min | INTRAMUSCULAR | Status: AC
  Administered 2016-12-17: 35 ug/min via INTRAVENOUS
  Filled 2016-12-16: qty 2

## 2016-12-16 MED ORDER — DOPAMINE-DEXTROSE 3.2-5 MG/ML-% IV SOLN
0.0000 ug/kg/min | INTRAVENOUS | Status: DC
  Filled 2016-12-16 (×2): qty 250

## 2016-12-16 MED ORDER — CHLORHEXIDINE GLUCONATE CLOTH 2 % EX PADS
6.0000 | MEDICATED_PAD | Freq: Once | CUTANEOUS | Status: AC
  Administered 2016-12-16: 6 via TOPICAL

## 2016-12-16 MED ORDER — METOPROLOL TARTRATE 12.5 MG HALF TABLET
12.5000 mg | ORAL_TABLET | Freq: Once | ORAL | Status: AC
  Administered 2016-12-17: 12.5 mg via ORAL
  Filled 2016-12-16: qty 1

## 2016-12-16 MED ORDER — VANCOMYCIN HCL 10 G IV SOLR
1250.0000 mg | INTRAVENOUS | Status: AC
  Administered 2016-12-17: 1250 mg via INTRAVENOUS
  Filled 2016-12-16: qty 1250

## 2016-12-16 MED ORDER — PLASMA-LYTE 148 IV SOLN
INTRAVENOUS | Status: AC
  Administered 2016-12-17: 400 mL
  Filled 2016-12-16: qty 2.5

## 2016-12-16 MED ORDER — BISACODYL 5 MG PO TBEC
5.0000 mg | DELAYED_RELEASE_TABLET | Freq: Once | ORAL | Status: AC
  Administered 2016-12-16: 5 mg via ORAL
  Filled 2016-12-16: qty 1

## 2016-12-16 NOTE — Progress Notes (Signed)
ANTICOAGULATION CONSULT NOTE - Follow Up Consult  Pharmacy Consult for Heparin Indication: multi-vessel CAD  Allergies  Allergen Reactions  . Citalopram Other (See Comments)    "Feels wierd"  . Diclofenac Sodium Nausea Only    GI Upset  . Fluoxetine Hcl Nausea Only  . Ibuprofen Nausea Only    Patient Measurements: Height: 5\' 2"  (157.5 cm) Weight: 150 lb 14.4 oz (68.4 kg) IBW/kg (Calculated) : 50.1 Heparin Dosing Weight: 65 kg  Vital Signs: Temp: 98.9 F (37.2 C) (07/22 0807) Temp Source: Oral (07/22 0807) BP: 115/77 (07/22 0807) Pulse Rate: 87 (07/22 0807)  Labs:  Recent Labs  12/14/16 0240 12/15/16 0211 12/15/16 1156 12/16/16 0413  HGB 11.6* 12.0  --  11.5*  HCT 37.2 38.2  --  36.9  PLT 344 348  --  365  HEPARINUNFRC 0.62 0.88* 0.69 0.62  CREATININE 0.80 0.75  --  0.76    Estimated Creatinine Clearance: 70.3 mL/min (by C-G formula based on SCr of 0.76 mg/dL).  Assessment: 89 yoF s/p cath on 7/18, showed 2 vessel CAD, awaiting CABG 7/23. Pharmacy consulted to dose heparin.   Heparin level therapeutic, 0.62. CBC stable/ no bleeding noted.  Goal of Therapy:  Heparin level 0.3-0.7 units/ml Monitor platelets by anticoagulation protocol: Yes   Plan:  Continue heparin drip 850 units/hour  Monitor heparin level and CBC    Bridgett Larsson, PharmD, MS PGY1 Pharmacy Resident 12/16/2016,11:15 AM

## 2016-12-16 NOTE — Progress Notes (Signed)
Progress Note  Patient Name: Michelle [name] Date of Encounter: 12/16/2016  Primary Cardiologist: Dr. Acie Fredrickson  Subjective   No recurrence of chest pain  Inpatient Medications    Scheduled Meds: . aspirin EC  81 mg Oral Daily  . atorvastatin  40 mg Oral QHS  . Chlorhexidine Gluconate Cloth  6 each Topical Daily  . famotidine  20 mg Oral QHS  . gabapentin  300 mg Oral TID  . metoprolol tartrate  12.5 mg Oral BID  . multivitamin with minerals  1 tablet Oral Daily  . mupirocin ointment  1 application Nasal BID  . PARoxetine  40 mg Oral Daily  . polyethylene glycol  17 g Oral Daily  . QUEtiapine  200 mg Oral TID  . sodium chloride flush  3 mL Intravenous Q12H  . venlafaxine XR  300 mg Oral Daily   Continuous Infusions: . sodium chloride    . heparin 850 Units/hr (12/15/16 2040)   PRN Meds: sodium chloride, acetaminophen, HYDROcodone-acetaminophen, nitroGLYCERIN, ondansetron (ZOFRAN) IV, senna-docusate, sodium chloride flush   Vital Signs    Vitals:   12/15/16 1433 12/15/16 2035 12/16/16 0608 12/16/16 0807  BP: 108/74 122/74 112/73 115/77  Pulse: 85 86 85 87  Resp: 16 20 19 18   Temp: 98.6 F (37 C) (!) 97.5 F (36.4 C) (!) 97.5 F (36.4 C) 98.9 F (37.2 C)  TempSrc: Oral Oral Oral Oral  SpO2: 98% 98% 97% 97%  Weight:   68.4 kg (150 lb 14.4 oz)   Height:        Intake/Output Summary (Last 24 hours) at 12/16/16 1044 Last data filed at 12/16/16 0800  Gross per 24 hour  Intake           1052.5 ml  Output                0 ml  Net           1052.5 ml   Filed Weights   12/14/16 0603 12/15/16 0447 12/16/16 0608  Weight: 68.9 kg (151 lb 12.8 oz) 68.4 kg (150 lb 14.4 oz) 68.4 kg (150 lb 14.4 oz)    Telemetry    NSR, HR in 70's - 90's. No ectopic events. - Personally Reviewed  ECG    No new tracings.   Physical Exam   General: Well developed, well nourished, female appearing in no acute distress. Head: Normocephalic, atraumatic.  Neck: Supple without  bruits, JVD not elevated. Lungs:  Resp regular and unlabored, CTA without wheezing or rales. Extremities: No clubbing, cyanosis, or lower extremity edema. Distal pedal pulses are 2+ bilaterally. Psych: Normal affect.  Labs    Chemistry  Recent Labs Lab 12/14/16 0240 12/15/16 0211 12/16/16 0413  NA 142 139 140  K 3.8 3.7 3.9  CL 106 105 106  CO2 26 25 26   GLUCOSE 84 87 82  BUN 9 13 16   CREATININE 0.80 0.75 0.76  CALCIUM 9.8 9.3 8.9  GFRNONAA >60 >60 >60  GFRAA >60 >60 >60  ANIONGAP 10 9 8      Hematology  Recent Labs Lab 12/14/16 0240 12/15/16 0211 12/16/16 0413  WBC 8.9 9.2 8.7  RBC 4.05 4.14 4.05  HGB 11.6* 12.0 11.5*  HCT 37.2 38.2 36.9  MCV 91.9 92.3 91.1  MCH 28.6 29.0 28.4  MCHC 31.2 31.4 31.2  RDW 14.8 14.8 14.9  PLT 344 348 365    Cardiac Studies   Cardiac Catheterization: 12/12/2016  Mid RCA lesion, 60 %stenosed.  RPDA  lesion, 95 %stenosed.  Dist RCA lesion, 95 %stenosed.  Post Atrio lesion, 95 %stenosed.  Mid LAD lesion, 80 %stenosed.  Ost 2nd Diag to 2nd Diag lesion, 75 %stenosed.  The left ventricular systolic function is normal.  LV end diastolic pressure is normal.  The left ventricular ejection fraction is 55-65% by visual estimate.  There is no aortic valve stenosis.   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   It is reasonable to consider CABG given the bifurcation disease, although there are percutaneous options as well.  I think a consult is warranted.  She has had some rest pain.  Will admit to inpatient service.  Start heparin 6 hours post sheath pull.  Patient Profile     56 y.o. female w/ PMH of HTN, HLD, seizure disorder, and cerebral aneurysm who presented to [facility] on 12/12/2016 for planned cardiac cath in the setting of chest pain and dyspnea on exertion. Cath showed severe 2-vessel disease with lesions at significant bifurications. CABG recommended.   Assessment & Plan      1. Multivessel CAD - Patient reported symptoms of exertional chest pressure and dyspnea on exertion with cath showing severe two-vessel disease with both lesions being at significant bifurcations as above.  Currently no chest pain and awaiting surgery in the morning.  2. Lower extremity edema and shortness of breath - Symptoms improved with Lasix. Echo showed normal LV systolic function with EF 60-65%. - she does not appear volume overloaded by physical examination.   3. Hypertension - BP has been well-controlled, last reading at 115/77 - continue Lopressor 12.5mg  BID.   4. Hyperlipidemia - Lipid panel this admission shows total cholesterol of 173 and an LDL of 87. Atorvastatin 40 mg has been initiated. Target LDL is < 70 with known CAD.  5. Tobacco use - she quit smoking 4 weeks prior to admission. Continued cessation advised.    Kerry Hough MD [facility] [place] 10:44 AM

## 2016-12-16 NOTE — Progress Notes (Signed)
TCTS BRIEF PROGRESS NOTE   No complaints For OR tomorrow All questions answered  Rexene Alberts, MD 12/16/2016 10:02 AM

## 2016-12-16 NOTE — Plan of Care (Signed)
Problem: Fluid Volume: Goal: Ability to maintain a balanced intake and output will improve Outcome: Not Applicable Date Met: 34/94/94 Patient has not had issues with fluid volume thus far this admission per this RN's assessment.  Patient not on fluid restriction.

## 2016-12-16 NOTE — Anesthesia Preprocedure Evaluation (Addendum)
Anesthesia Evaluation  Patient identified by MRN, date of birth, ID band Patient awake    Reviewed: Allergy & Precautions, NPO status , Patient's Chart, lab work & pertinent test results  Airway Mallampati: III  TM Distance: <3 FB Neck ROM: Full  Mouth opening: Limited Mouth Opening  Dental no notable dental hx. (+) Teeth Intact, Dental Advisory Given   Pulmonary shortness of breath, former smoker,    breath sounds clear to auscultation       Cardiovascular hypertension, + angina + CAD   Rhythm:Regular Rate:Normal     Neuro/Psych  Headaches, CVA    GI/Hepatic GERD  ,  Endo/Other    Renal/GU      Musculoskeletal   Abdominal   Peds  Hematology   Anesthesia Other Findings   Reproductive/Obstetrics                           Anesthesia Physical Anesthesia Plan  ASA: III  Anesthesia Plan: General   Post-op Pain Management:    Induction: Intravenous  PONV Risk Score and Plan: 4 or greater and Ondansetron, Dexamethasone, Propofol, Midazolam, Scopolamine patch - Pre-op and Treatment may vary due to age or medical condition  Airway Management Planned: Oral ETT  Additional Equipment: Arterial line, PA Cath, TEE and Ultrasound Guidance Line Placement  Intra-op Plan:   Post-operative Plan: Post-operative intubation/ventilation  Informed Consent: I have reviewed the patients History and Physical, chart, labs and discussed the procedure including the risks, benefits and alternatives for the proposed anesthesia with the patient or authorized representative who has indicated his/her understanding and acceptance.   Dental advisory given  Plan Discussed with: CRNA  Anesthesia Plan Comments:         Anesthesia Quick Evaluation

## 2016-12-17 ENCOUNTER — Inpatient Hospital Stay (HOSPITAL_COMMUNITY): Payer: BLUE CROSS/BLUE SHIELD | Admitting: Certified Registered"

## 2016-12-17 ENCOUNTER — Inpatient Hospital Stay (HOSPITAL_COMMUNITY): Payer: BLUE CROSS/BLUE SHIELD

## 2016-12-17 ENCOUNTER — Inpatient Hospital Stay (HOSPITAL_COMMUNITY)
Admission: AD | Disposition: A | Discharge status: Home Health | Payer: Self-pay | Source: Ambulatory Visit | Attending: Surgery

## 2016-12-17 ENCOUNTER — Other Ambulatory Visit: Payer: Self-pay

## 2016-12-17 DIAGNOSIS — I25110 Atherosclerotic heart disease of native coronary artery with unstable angina pectoris: Secondary | ICD-10-CM

## 2016-12-17 DIAGNOSIS — Z951 Presence of aortocoronary bypass graft: Secondary | ICD-10-CM

## 2016-12-17 HISTORY — PX: CORONARY ARTERY BYPASS GRAFT: SHX141

## 2016-12-17 HISTORY — PX: INTRAOPERATIVE TRANSESOPHAGEAL ECHOCARDIOGRAM: SHX5062

## 2016-12-17 LAB — PREPARE RBC (CROSSMATCH)

## 2016-12-17 LAB — CBC WITH DIFFERENTIAL/PLATELET
BASOS ABS: 0 10*3/uL (ref 0.0–0.1)
BASOS PCT: 0 %
EOS ABS: 0 10*3/uL (ref 0.0–0.7)
Eosinophils Relative: 0 %
HCT: 27 % — ABNORMAL LOW (ref 36.0–46.0)
HEMOGLOBIN: 8.7 g/dL — AB (ref 12.0–15.0)
LYMPHS ABS: 1.4 10*3/uL (ref 0.7–4.0)
Lymphocytes Relative: 9 %
MCH: 29 pg (ref 26.0–34.0)
MCHC: 32.2 g/dL (ref 30.0–36.0)
MCV: 90 fL (ref 78.0–100.0)
Monocytes Absolute: 1 10*3/uL (ref 0.1–1.0)
Monocytes Relative: 6 %
NEUTROS PCT: 85 %
Neutro Abs: 13.7 10*3/uL — ABNORMAL HIGH (ref 1.7–7.7)
PLATELETS: 231 10*3/uL (ref 150–400)
RBC: 3 MIL/uL — AB (ref 3.87–5.11)
RDW: 15 % (ref 11.5–15.5)
WBC: 16.1 10*3/uL — AB (ref 4.0–10.5)

## 2016-12-17 LAB — POCT I-STAT 3, ART BLOOD GAS (G3+)
ACID-BASE DEFICIT: 2 mmol/L (ref 0.0–2.0)
ACID-BASE DEFICIT: 3 mmol/L — AB (ref 0.0–2.0)
Acid-Base Excess: 4 mmol/L — ABNORMAL HIGH (ref 0.0–2.0)
Acid-base deficit: 2 mmol/L (ref 0.0–2.0)
Acid-base deficit: 3 mmol/L — ABNORMAL HIGH (ref 0.0–2.0)
Acid-base deficit: 4 mmol/L — ABNORMAL HIGH (ref 0.0–2.0)
BICARBONATE: 27.9 mmol/L (ref 20.0–28.0)
BICARBONATE: 21.6 mmol/L (ref 20.0–28.0)
BICARBONATE: 23.3 mmol/L (ref 20.0–28.0)
Bicarbonate: 23.4 mmol/L (ref 20.0–28.0)
Bicarbonate: 23 mmol/L (ref 20.0–28.0)
Bicarbonate: 21.5 mmol/L (ref 20.0–28.0)
O2 SAT: 93 %
O2 SAT: 84 %
O2 SAT: 88 %
O2 SAT: 88 %
O2 Saturation: 100 %
O2 Saturation: 93 %
PCO2 ART: 41.5 mmHg (ref 32.0–48.0)
PCO2 ART: 39.9 mmHg (ref 32.0–48.0)
PH ART: 7.45 (ref 7.350–7.450)
PH ART: 7.446 (ref 7.350–7.450)
PH ART: 7.339 — AB (ref 7.350–7.450)
PO2 ART: 370 mmHg — AB (ref 83.0–108.0)
PO2 ART: 58 mmHg — AB (ref 83.0–108.0)
Patient temperature: 35.7
Patient temperature: 37
TCO2: 29 mmol/L (ref 0–100)
TCO2: 22 mmol/L (ref 0–100)
TCO2: 25 mmol/L (ref 0–100)
TCO2: 25 mmol/L (ref 0–100)
TCO2: 24 mmol/L (ref 0–100)
TCO2: 23 mmol/L (ref 0–100)
pCO2 arterial: 40.2 mmHg (ref 32.0–48.0)
pCO2 arterial: 31.3 mmHg — ABNORMAL LOW (ref 32.0–48.0)
pCO2 arterial: 47.5 mmHg (ref 32.0–48.0)
pCO2 arterial: 44.2 mmHg (ref 32.0–48.0)
pH, Arterial: 7.354 (ref 7.350–7.450)
pH, Arterial: 7.295 — ABNORMAL LOW (ref 7.350–7.450)
pH, Arterial: 7.321 — ABNORMAL LOW (ref 7.350–7.450)
pO2, Arterial: 62 mmHg — ABNORMAL LOW (ref 83.0–108.0)
pO2, Arterial: 65 mmHg — ABNORMAL LOW (ref 83.0–108.0)
pO2, Arterial: 52 mmHg — ABNORMAL LOW (ref 83.0–108.0)
pO2, Arterial: 59 mmHg — ABNORMAL LOW (ref 83.0–108.0)

## 2016-12-17 LAB — POCT I-STAT, CHEM 8
BUN: 16 mg/dL (ref 6–20)
BUN: 10 mg/dL (ref 6–20)
BUN: 12 mg/dL (ref 6–20)
BUN: 10 mg/dL (ref 6–20)
BUN: 11 mg/dL (ref 6–20)
BUN: 10 mg/dL (ref 6–20)
CALCIUM ION: 1.04 mmol/L — AB (ref 1.15–1.40)
CALCIUM ION: 1.12 mmol/L — AB (ref 1.15–1.40)
CALCIUM ION: 1.15 mmol/L (ref 1.15–1.40)
CHLORIDE: 104 mmol/L (ref 101–111)
CHLORIDE: 103 mmol/L (ref 101–111)
CHLORIDE: 106 mmol/L (ref 101–111)
CREATININE: 0.4 mg/dL — AB (ref 0.44–1.00)
CREATININE: 0.3 mg/dL — AB (ref 0.44–1.00)
CREATININE: 0.5 mg/dL (ref 0.44–1.00)
CREATININE: 0.3 mg/dL — AB (ref 0.44–1.00)
CREATININE: 0.5 mg/dL (ref 0.44–1.00)
CREATININE: 0.5 mg/dL (ref 0.44–1.00)
Calcium, Ion: 1.23 mmol/L (ref 1.15–1.40)
Calcium, Ion: 0.91 mmol/L — ABNORMAL LOW (ref 1.15–1.40)
Calcium, Ion: 1.19 mmol/L (ref 1.15–1.40)
Chloride: 105 mmol/L (ref 101–111)
Chloride: 100 mmol/L — ABNORMAL LOW (ref 101–111)
Chloride: 103 mmol/L (ref 101–111)
GLUCOSE: 101 mg/dL — AB (ref 65–99)
GLUCOSE: 87 mg/dL (ref 65–99)
GLUCOSE: 139 mg/dL — AB (ref 65–99)
GLUCOSE: 111 mg/dL — AB (ref 65–99)
Glucose, Bld: 81 mg/dL (ref 65–99)
Glucose, Bld: 119 mg/dL — ABNORMAL HIGH (ref 65–99)
HCT: 21 % — ABNORMAL LOW (ref 36.0–46.0)
HCT: 26 % — ABNORMAL LOW (ref 36.0–46.0)
HEMATOCRIT: 32 % — AB (ref 36.0–46.0)
HEMATOCRIT: 22 % — AB (ref 36.0–46.0)
HEMATOCRIT: 29 % — AB (ref 36.0–46.0)
HEMATOCRIT: 26 % — AB (ref 36.0–46.0)
HEMOGLOBIN: 10.9 g/dL — AB (ref 12.0–15.0)
HEMOGLOBIN: 9.9 g/dL — AB (ref 12.0–15.0)
HEMOGLOBIN: 8.8 g/dL — AB (ref 12.0–15.0)
Hemoglobin: 7.5 g/dL — ABNORMAL LOW (ref 12.0–15.0)
Hemoglobin: 7.1 g/dL — ABNORMAL LOW (ref 12.0–15.0)
Hemoglobin: 8.8 g/dL — ABNORMAL LOW (ref 12.0–15.0)
POTASSIUM: 4.4 mmol/L (ref 3.5–5.1)
POTASSIUM: 4 mmol/L (ref 3.5–5.1)
POTASSIUM: 4.1 mmol/L (ref 3.5–5.1)
POTASSIUM: 4.7 mmol/L (ref 3.5–5.1)
POTASSIUM: 4.5 mmol/L (ref 3.5–5.1)
Potassium: 4.5 mmol/L (ref 3.5–5.1)
SODIUM: 139 mmol/L (ref 135–145)
SODIUM: 140 mmol/L (ref 135–145)
Sodium: 139 mmol/L (ref 135–145)
Sodium: 140 mmol/L (ref 135–145)
Sodium: 137 mmol/L (ref 135–145)
Sodium: 139 mmol/L (ref 135–145)
TCO2: 30 mmol/L (ref 0–100)
TCO2: 25 mmol/L (ref 0–100)
TCO2: 28 mmol/L (ref 0–100)
TCO2: 26 mmol/L (ref 0–100)
TCO2: 22 mmol/L (ref 0–100)
TCO2: 21 mmol/L (ref 0–100)

## 2016-12-17 LAB — GLUCOSE, CAPILLARY
GLUCOSE-CAPILLARY: 110 mg/dL — AB (ref 65–99)
GLUCOSE-CAPILLARY: 116 mg/dL — AB (ref 65–99)
GLUCOSE-CAPILLARY: 117 mg/dL — AB (ref 65–99)
GLUCOSE-CAPILLARY: 123 mg/dL — AB (ref 65–99)
GLUCOSE-CAPILLARY: 124 mg/dL — AB (ref 65–99)
GLUCOSE-CAPILLARY: 145 mg/dL — AB (ref 65–99)
Glucose-Capillary: 114 mg/dL — ABNORMAL HIGH (ref 65–99)
Glucose-Capillary: 123 mg/dL — ABNORMAL HIGH (ref 65–99)
Glucose-Capillary: 119 mg/dL — ABNORMAL HIGH (ref 65–99)
Glucose-Capillary: 128 mg/dL — ABNORMAL HIGH (ref 65–99)

## 2016-12-17 LAB — PROTIME-INR
INR: 1.32
Prothrombin Time: 16.4 seconds — ABNORMAL HIGH (ref 11.4–15.2)

## 2016-12-17 LAB — POCT I-STAT 4, (NA,K, GLUC, HGB,HCT)
GLUCOSE: 81 mg/dL (ref 65–99)
HEMATOCRIT: 26 % — AB (ref 36.0–46.0)
HEMOGLOBIN: 8.8 g/dL — AB (ref 12.0–15.0)
Potassium: 3.6 mmol/L (ref 3.5–5.1)
Sodium: 141 mmol/L (ref 135–145)

## 2016-12-17 LAB — CBC
HCT: 36.4 % (ref 36.0–46.0)
HEMATOCRIT: 28.4 % — AB (ref 36.0–46.0)
Hemoglobin: 11.5 g/dL — ABNORMAL LOW (ref 12.0–15.0)
Hemoglobin: 9 g/dL — ABNORMAL LOW (ref 12.0–15.0)
MCH: 28.5 pg (ref 26.0–34.0)
MCH: 28.3 pg (ref 26.0–34.0)
MCHC: 31.6 g/dL (ref 30.0–36.0)
MCHC: 31.7 g/dL (ref 30.0–36.0)
MCV: 90.3 fL (ref 78.0–100.0)
MCV: 89.3 fL (ref 78.0–100.0)
PLATELETS: 346 10*3/uL (ref 150–400)
Platelets: 201 10*3/uL (ref 150–400)
RBC: 4.03 MIL/uL (ref 3.87–5.11)
RBC: 3.18 MIL/uL — ABNORMAL LOW (ref 3.87–5.11)
RDW: 14.8 % (ref 11.5–15.5)
RDW: 14.7 % (ref 11.5–15.5)
WBC: 8.5 10*3/uL (ref 4.0–10.5)
WBC: 16.6 10*3/uL — AB (ref 4.0–10.5)

## 2016-12-17 LAB — CREATININE, SERUM: CREATININE: 0.65 mg/dL (ref 0.44–1.00)

## 2016-12-17 LAB — HEMOGLOBIN AND HEMATOCRIT, BLOOD
HCT: 23.4 % — ABNORMAL LOW (ref 36.0–46.0)
Hemoglobin: 7.6 g/dL — ABNORMAL LOW (ref 12.0–15.0)

## 2016-12-17 LAB — BASIC METABOLIC PANEL
ANION GAP: 7 (ref 5–15)
BUN: 13 mg/dL (ref 6–20)
CALCIUM: 9.2 mg/dL (ref 8.9–10.3)
CO2: 25 mmol/L (ref 22–32)
CREATININE: 0.74 mg/dL (ref 0.44–1.00)
Chloride: 107 mmol/L (ref 101–111)
Glucose, Bld: 87 mg/dL (ref 65–99)
Potassium: 4 mmol/L (ref 3.5–5.1)
SODIUM: 139 mmol/L (ref 135–145)

## 2016-12-17 LAB — HEPARIN LEVEL (UNFRACTIONATED): HEPARIN UNFRACTIONATED: 0.5 [IU]/mL (ref 0.30–0.70)

## 2016-12-17 LAB — APTT: APTT: 32 s (ref 24–36)

## 2016-12-17 LAB — PLATELET COUNT: PLATELETS: 168 10*3/uL (ref 150–400)

## 2016-12-17 LAB — MAGNESIUM: MAGNESIUM: 3.2 mg/dL — AB (ref 1.7–2.4)

## 2016-12-17 SURGERY — CORONARY ARTERY BYPASS GRAFTING (CABG)
Anesthesia: General | Site: Chest

## 2016-12-17 MED ORDER — BISACODYL 10 MG RE SUPP: 10.0000 mg | Freq: Every day | RECTAL | Status: DC

## 2016-12-17 MED ORDER — MAGNESIUM SULFATE 4 GM/100ML IV SOLN
INTRAVENOUS | Status: AC
  Filled 2016-12-17: qty 100

## 2016-12-17 MED ORDER — GABAPENTIN 300 MG PO CAPS
300.0000 mg | ORAL_CAPSULE | Freq: Three times a day (TID) | ORAL | Status: DC
  Administered 2016-12-18 – 2016-12-23 (×15): 300 mg via ORAL
  Filled 2016-12-17 (×16): qty 1

## 2016-12-17 MED ORDER — MORPHINE SULFATE (PF) 2 MG/ML IV SOLN: 2.0000 mg | INTRAVENOUS | Status: DC | PRN

## 2016-12-17 MED ORDER — ACETAMINOPHEN 160 MG/5ML PO SOLN
1000.0000 mg | Freq: Four times a day (QID) | ORAL | Status: DC
  Administered 2016-12-17 – 2016-12-21 (×3): 1000 mg
  Filled 2016-12-17 (×3): qty 40.6

## 2016-12-17 MED ORDER — LACTATED RINGERS IV SOLN
INTRAVENOUS | Status: DC | PRN
  Administered 2016-12-17: 07:00:00 via INTRAVENOUS

## 2016-12-17 MED ORDER — THROMBIN 20000 UNITS EX SOLR
CUTANEOUS | Status: DC | PRN
  Administered 2016-12-17: 20000 [IU] via TOPICAL

## 2016-12-17 MED ORDER — PROPOFOL 10 MG/ML IV BOLUS
INTRAVENOUS | Status: AC
  Filled 2016-12-17: qty 20

## 2016-12-17 MED ORDER — DOPAMINE-DEXTROSE 3.2-5 MG/ML-% IV SOLN
2.0000 ug/kg/min | INTRAVENOUS | Status: DC
  Administered 2016-12-17: 2 ug/kg/min via INTRAVENOUS
  Filled 2016-12-17: qty 250

## 2016-12-17 MED ORDER — SODIUM CHLORIDE 0.9 % IV SOLN: INTRAVENOUS | Status: DC

## 2016-12-17 MED ORDER — PAROXETINE HCL 20 MG PO TABS
40.0000 mg | ORAL_TABLET | Freq: Every day | ORAL | Status: DC
  Administered 2016-12-18 – 2016-12-23 (×6): 40 mg via ORAL
  Filled 2016-12-17 (×6): qty 2

## 2016-12-17 MED ORDER — CHLORHEXIDINE GLUCONATE 0.12% ORAL RINSE (MEDLINE KIT)
15.0000 mL | Freq: Two times a day (BID) | OROMUCOSAL | Status: DC
  Administered 2016-12-18: 15 mL via OROMUCOSAL

## 2016-12-17 MED ORDER — ORAL CARE MOUTH RINSE
15.0000 mL | OROMUCOSAL | Status: DC
  Administered 2016-12-17 – 2016-12-18 (×5): 15 mL via OROMUCOSAL

## 2016-12-17 MED ORDER — LACTATED RINGERS IV SOLN: INTRAVENOUS | Status: DC

## 2016-12-17 MED ORDER — ASPIRIN 81 MG PO CHEW
324.0000 mg | CHEWABLE_TABLET | Freq: Every day | ORAL | Status: DC
  Administered 2016-12-21: 324 mg
  Filled 2016-12-17: qty 4

## 2016-12-17 MED ORDER — LEVALBUTEROL HCL 1.25 MG/0.5ML IN NEBU
1.2500 mg | INHALATION_SOLUTION | Freq: Four times a day (QID) | RESPIRATORY_TRACT | Status: DC
  Administered 2016-12-18 (×4): 1.25 mg via RESPIRATORY_TRACT
  Filled 2016-12-17 (×4): qty 0.5

## 2016-12-17 MED ORDER — FAMOTIDINE IN NACL 20-0.9 MG/50ML-% IV SOLN
20.0000 mg | Freq: Two times a day (BID) | INTRAVENOUS | Status: AC
  Administered 2016-12-17 (×2): 20 mg via INTRAVENOUS
  Filled 2016-12-17: qty 50

## 2016-12-17 MED ORDER — OXYCODONE HCL 5 MG PO TABS: 5.0000 mg | ORAL_TABLET | ORAL | Status: DC | PRN

## 2016-12-17 MED ORDER — ACETAMINOPHEN 500 MG PO TABS
1000.0000 mg | ORAL_TABLET | Freq: Four times a day (QID) | ORAL | Status: DC
  Administered 2016-12-18 – 2016-12-21 (×9): 1000 mg via ORAL
  Filled 2016-12-17 (×12): qty 2

## 2016-12-17 MED ORDER — METOPROLOL TARTRATE 5 MG/5ML IV SOLN: 2.5000 mg | INTRAVENOUS | Status: DC | PRN

## 2016-12-17 MED ORDER — FENTANYL CITRATE (PF) 100 MCG/2ML IJ SOLN
INTRAMUSCULAR | Status: DC | PRN
  Administered 2016-12-17 (×3): 50 ug via INTRAVENOUS
  Administered 2016-12-17: 100 ug via INTRAVENOUS
  Administered 2016-12-17: 50 ug via INTRAVENOUS
  Administered 2016-12-17 (×4): 100 ug via INTRAVENOUS
  Administered 2016-12-17 (×2): 150 ug via INTRAVENOUS

## 2016-12-17 MED ORDER — ARTIFICIAL TEARS OPHTHALMIC OINT
TOPICAL_OINTMENT | OPHTHALMIC | Status: DC | PRN
  Administered 2016-12-17: 1 via OPHTHALMIC

## 2016-12-17 MED ORDER — ROCURONIUM BROMIDE 100 MG/10ML IV SOLN
INTRAVENOUS | Status: DC | PRN
  Administered 2016-12-17: 40 mg via INTRAVENOUS
  Administered 2016-12-17 (×2): 50 mg via INTRAVENOUS
  Administered 2016-12-17 (×3): 20 mg via INTRAVENOUS

## 2016-12-17 MED ORDER — SODIUM CHLORIDE 0.9 % IV SOLN
0.0000 ug/kg/h | INTRAVENOUS | Status: DC
  Filled 2016-12-17: qty 2

## 2016-12-17 MED ORDER — LIDOCAINE HCL (CARDIAC) 20 MG/ML IV SOLN
INTRAVENOUS | Status: DC | PRN
  Administered 2016-12-17: 50 mg via INTRAVENOUS

## 2016-12-17 MED ORDER — FUROSEMIDE 10 MG/ML IJ SOLN
20.0000 mg | Freq: Once | INTRAMUSCULAR | Status: AC
  Administered 2016-12-17: 20 mg via INTRAVENOUS
  Filled 2016-12-17: qty 2

## 2016-12-17 MED ORDER — MIDAZOLAM HCL 10 MG/2ML IJ SOLN
INTRAMUSCULAR | Status: AC
  Filled 2016-12-17: qty 2

## 2016-12-17 MED ORDER — LEVALBUTEROL HCL 1.25 MG/0.5ML IN NEBU
1.2500 mg | INHALATION_SOLUTION | Freq: Four times a day (QID) | RESPIRATORY_TRACT | Status: DC
  Administered 2016-12-17: 1.25 mg via RESPIRATORY_TRACT

## 2016-12-17 MED ORDER — INSULIN REGULAR BOLUS VIA INFUSION
0.0000 [IU] | Freq: Three times a day (TID) | INTRAVENOUS | Status: DC
  Filled 2016-12-17: qty 10

## 2016-12-17 MED ORDER — LIDOCAINE HCL (CARDIAC) 20 MG/ML IV SOLN
INTRAVENOUS | Status: AC
  Filled 2016-12-17: qty 5

## 2016-12-17 MED ORDER — THROMBIN 20000 UNITS EX SOLR
OROMUCOSAL | Status: DC | PRN
  Administered 2016-12-17 (×3): 4 mL via TOPICAL

## 2016-12-17 MED ORDER — ONDANSETRON HCL 4 MG/2ML IJ SOLN: 4.0000 mg | Freq: Four times a day (QID) | INTRAMUSCULAR | Status: DC | PRN

## 2016-12-17 MED ORDER — SODIUM CHLORIDE 0.9% FLUSH: 3.0000 mL | INTRAVENOUS | Status: DC | PRN

## 2016-12-17 MED ORDER — TRAMADOL HCL 50 MG PO TABS: 50.0000 mg | ORAL_TABLET | ORAL | Status: DC | PRN

## 2016-12-17 MED ORDER — PHENYLEPHRINE 40 MCG/ML (10ML) SYRINGE FOR IV PUSH (FOR BLOOD PRESSURE SUPPORT)
PREFILLED_SYRINGE | INTRAVENOUS | Status: AC
  Filled 2016-12-17: qty 10

## 2016-12-17 MED ORDER — THROMBIN 20000 UNITS EX SOLR
CUTANEOUS | Status: AC
  Filled 2016-12-17: qty 20000

## 2016-12-17 MED ORDER — MIDAZOLAM HCL 5 MG/5ML IJ SOLN
INTRAMUSCULAR | Status: DC | PRN
  Administered 2016-12-17: 4 mg via INTRAVENOUS
  Administered 2016-12-17: 1 mg via INTRAVENOUS
  Administered 2016-12-17: 4 mg via INTRAVENOUS
  Administered 2016-12-17: 1 mg via INTRAVENOUS

## 2016-12-17 MED ORDER — ALBUMIN HUMAN 5 % IV SOLN
INTRAVENOUS | Status: DC | PRN
  Administered 2016-12-17: 12:00:00 via INTRAVENOUS

## 2016-12-17 MED ORDER — MIDAZOLAM HCL 2 MG/2ML IJ SOLN
2.0000 mg | INTRAMUSCULAR | Status: DC | PRN
  Administered 2016-12-17 – 2016-12-18 (×6): 2 mg via INTRAVENOUS
  Filled 2016-12-17 (×7): qty 2

## 2016-12-17 MED ORDER — DOCUSATE SODIUM 100 MG PO CAPS
200.0000 mg | ORAL_CAPSULE | Freq: Every day | ORAL | Status: DC
  Administered 2016-12-18 – 2016-12-19 (×2): 200 mg via ORAL
  Filled 2016-12-17 (×2): qty 2

## 2016-12-17 MED ORDER — SODIUM CHLORIDE 0.9 % IV SOLN
0.0000 ug/min | INTRAVENOUS | Status: DC
  Administered 2016-12-17 – 2016-12-18 (×2): 55 ug/min via INTRAVENOUS
  Administered 2016-12-18: 70 ug/min via INTRAVENOUS
  Administered 2016-12-18: 40 ug/min via INTRAVENOUS
  Filled 2016-12-17 (×4): qty 2

## 2016-12-17 MED ORDER — SODIUM CHLORIDE 0.45 % IV SOLN: INTRAVENOUS | Status: DC | PRN

## 2016-12-17 MED ORDER — PHENYLEPHRINE HCL 10 MG/ML IJ SOLN
INTRAVENOUS | Status: DC | PRN
  Administered 2016-12-17: 25 ug/min via INTRAVENOUS
  Administered 2016-12-17: 10 ug/min via INTRAVENOUS

## 2016-12-17 MED ORDER — ASPIRIN EC 325 MG PO TBEC
325.0000 mg | DELAYED_RELEASE_TABLET | Freq: Every day | ORAL | Status: DC
  Administered 2016-12-18 – 2016-12-20 (×3): 325 mg via ORAL
  Filled 2016-12-17 (×3): qty 1

## 2016-12-17 MED ORDER — VANCOMYCIN HCL IN DEXTROSE 1-5 GM/200ML-% IV SOLN
1000.0000 mg | Freq: Once | INTRAVENOUS | Status: AC
  Administered 2016-12-17: 1000 mg via INTRAVENOUS
  Filled 2016-12-17: qty 200

## 2016-12-17 MED ORDER — PANTOPRAZOLE SODIUM 40 MG PO TBEC
40.0000 mg | DELAYED_RELEASE_TABLET | Freq: Every day | ORAL | Status: DC
  Administered 2016-12-18 – 2016-12-21 (×4): 40 mg via ORAL
  Filled 2016-12-17 (×4): qty 1

## 2016-12-17 MED ORDER — MORPHINE SULFATE (PF) 2 MG/ML IV SOLN: 1.0000 mg | INTRAVENOUS | Status: DC | PRN

## 2016-12-17 MED ORDER — QUETIAPINE FUMARATE 200 MG PO TABS
200.0000 mg | ORAL_TABLET | Freq: Three times a day (TID) | ORAL | Status: DC
  Administered 2016-12-18 – 2016-12-20 (×6): 200 mg via ORAL
  Filled 2016-12-17 (×2): qty 2
  Filled 2016-12-17 (×5): qty 1
  Filled 2016-12-17: qty 4
  Filled 2016-12-17: qty 1
  Filled 2016-12-17 (×3): qty 2
  Filled 2016-12-17: qty 4
  Filled 2016-12-17 (×2): qty 1

## 2016-12-17 MED ORDER — HEPARIN SODIUM (PORCINE) 1000 UNIT/ML IJ SOLN
INTRAMUSCULAR | Status: DC | PRN
  Administered 2016-12-17: 35000 [IU] via INTRAVENOUS

## 2016-12-17 MED ORDER — SODIUM CHLORIDE 0.9 % IV SOLN
INTRAVENOUS | Status: DC
  Administered 2016-12-17: 2 [IU]/h via INTRAVENOUS
  Filled 2016-12-17 (×2): qty 1

## 2016-12-17 MED ORDER — ACETAMINOPHEN 650 MG RE SUPP
650.0000 mg | Freq: Once | RECTAL | Status: AC
  Administered 2016-12-17: 650 mg via RECTAL

## 2016-12-17 MED ORDER — SODIUM CHLORIDE 0.9 % IV SOLN: 250.0000 mL | INTRAVENOUS | Status: DC

## 2016-12-17 MED ORDER — POTASSIUM CHLORIDE 10 MEQ/50ML IV SOLN
10.0000 meq | INTRAVENOUS | Status: AC
  Administered 2016-12-17 (×3): 10 meq via INTRAVENOUS

## 2016-12-17 MED ORDER — LIDOCAINE HCL (CARDIAC) 20 MG/ML IV SOLN: INTRAVENOUS | Status: DC | PRN

## 2016-12-17 MED ORDER — MORPHINE SULFATE (PF) 4 MG/ML IV SOLN
2.0000 mg | INTRAVENOUS | Status: DC | PRN
  Administered 2016-12-18: 2 mg via INTRAVENOUS
  Administered 2016-12-18: 4 mg via INTRAVENOUS
  Filled 2016-12-17 (×3): qty 1

## 2016-12-17 MED ORDER — BISACODYL 5 MG PO TBEC
10.0000 mg | DELAYED_RELEASE_TABLET | Freq: Every day | ORAL | Status: DC
  Administered 2016-12-18 – 2016-12-19 (×2): 10 mg via ORAL
  Filled 2016-12-17 (×2): qty 2

## 2016-12-17 MED ORDER — SODIUM CHLORIDE 0.9% FLUSH
3.0000 mL | Freq: Two times a day (BID) | INTRAVENOUS | Status: DC
  Administered 2016-12-18 – 2016-12-21 (×6): 3 mL via INTRAVENOUS

## 2016-12-17 MED ORDER — METOPROLOL TARTRATE 25 MG/10 ML ORAL SUSPENSION: 12.5000 mg | Freq: Two times a day (BID) | ORAL | Status: DC

## 2016-12-17 MED ORDER — SUCCINYLCHOLINE 20MG/ML (10ML) SYRINGE FOR MEDFUSION PUMP - OPTIME
INTRAMUSCULAR | Status: DC | PRN
  Administered 2016-12-17: 100 mg via INTRAVENOUS

## 2016-12-17 MED ORDER — ATORVASTATIN CALCIUM 40 MG PO TABS
40.0000 mg | ORAL_TABLET | Freq: Every day | ORAL | Status: DC
  Administered 2016-12-19 – 2016-12-22 (×4): 40 mg via ORAL
  Filled 2016-12-17 (×5): qty 1

## 2016-12-17 MED ORDER — NITROGLYCERIN IN D5W 200-5 MCG/ML-% IV SOLN: 0.0000 ug/min | INTRAVENOUS | Status: DC

## 2016-12-17 MED ORDER — LACTATED RINGERS IV SOLN: 500.0000 mL | Freq: Once | INTRAVENOUS | Status: DC | PRN

## 2016-12-17 MED ORDER — PROPOFOL 10 MG/ML IV BOLUS: INTRAVENOUS | Status: DC | PRN

## 2016-12-17 MED ORDER — METOPROLOL TARTRATE 12.5 MG HALF TABLET: 12.5000 mg | ORAL_TABLET | Freq: Two times a day (BID) | ORAL | Status: DC

## 2016-12-17 MED ORDER — DEXMEDETOMIDINE HCL IN NACL 400 MCG/100ML IV SOLN
0.0000 ug/kg/h | INTRAVENOUS | Status: DC
  Administered 2016-12-17 – 2016-12-18 (×2): 1 ug/kg/h via INTRAVENOUS
  Filled 2016-12-17 (×2): qty 100

## 2016-12-17 MED ORDER — MORPHINE SULFATE (PF) 4 MG/ML IV SOLN: 1.0000 mg | INTRAVENOUS | Status: AC | PRN

## 2016-12-17 MED ORDER — MAGNESIUM SULFATE 4 GM/100ML IV SOLN
4.0000 g | Freq: Once | INTRAVENOUS | Status: AC
  Administered 2016-12-17: 4 g via INTRAVENOUS

## 2016-12-17 MED ORDER — SODIUM CHLORIDE 0.9 % IV SOLN: 0.0000 ug/kg/h | INTRAVENOUS | Status: DC

## 2016-12-17 MED ORDER — CHLORHEXIDINE GLUCONATE 0.12 % MT SOLN: 15.0000 mL | OROMUCOSAL | Status: AC

## 2016-12-17 MED ORDER — ACETAMINOPHEN 160 MG/5ML PO SOLN: 650.0000 mg | Freq: Once | ORAL | Status: AC

## 2016-12-17 MED ORDER — PROTAMINE SULFATE 10 MG/ML IV SOLN
INTRAVENOUS | Status: DC | PRN
  Administered 2016-12-17: 50 mg via INTRAVENOUS
  Administered 2016-12-17: 30 mg via INTRAVENOUS
  Administered 2016-12-17: 40 mg via INTRAVENOUS
  Administered 2016-12-17 (×2): 30 mg via INTRAVENOUS
  Administered 2016-12-17: 10 mg via INTRAVENOUS
  Administered 2016-12-17: 30 mg via INTRAVENOUS
  Administered 2016-12-17: 50 mg via INTRAVENOUS
  Administered 2016-12-17: 30 mg via INTRAVENOUS

## 2016-12-17 MED ORDER — MIDAZOLAM HCL 2 MG/2ML IJ SOLN
INTRAMUSCULAR | Status: AC
  Filled 2016-12-17: qty 2

## 2016-12-17 MED ORDER — LEVALBUTEROL HCL 1.25 MG/0.5ML IN NEBU
INHALATION_SOLUTION | RESPIRATORY_TRACT | Status: AC
  Filled 2016-12-17: qty 0.5

## 2016-12-17 MED ORDER — ALBUMIN HUMAN 5 % IV SOLN
250.0000 mL | INTRAVENOUS | Status: AC | PRN
  Administered 2016-12-17 – 2016-12-18 (×4): 250 mL via INTRAVENOUS
  Filled 2016-12-17 (×2): qty 250

## 2016-12-17 MED ORDER — PROPOFOL 10 MG/ML IV BOLUS
INTRAVENOUS | Status: DC | PRN
  Administered 2016-12-17: 70 mg via INTRAVENOUS
  Administered 2016-12-17: 40 mg via INTRAVENOUS

## 2016-12-17 MED ORDER — FENTANYL CITRATE (PF) 250 MCG/5ML IJ SOLN
INTRAMUSCULAR | Status: AC
  Filled 2016-12-17: qty 25

## 2016-12-17 MED ORDER — DEXTROSE 5 % IV SOLN
1.5000 g | Freq: Two times a day (BID) | INTRAVENOUS | Status: AC
  Administered 2016-12-17 – 2016-12-19 (×4): 1.5 g via INTRAVENOUS
  Filled 2016-12-17 (×4): qty 1.5

## 2016-12-17 MED ORDER — VENLAFAXINE HCL ER 150 MG PO CP24
300.0000 mg | ORAL_CAPSULE | Freq: Every day | ORAL | Status: DC
  Administered 2016-12-18 – 2016-12-20 (×3): 300 mg via ORAL
  Filled 2016-12-17 (×3): qty 2

## 2016-12-17 MED ORDER — TRANEXAMIC ACID 1000 MG/10ML IV SOLN
INTRAVENOUS | Status: DC | PRN
  Administered 2016-12-17: 1026 mg via INTRAVENOUS

## 2016-12-17 SURGICAL SUPPLY — 107 items
BAG DECANTER FOR FLEXI CONT (MISCELLANEOUS) ×3 IMPLANT
BANDAGE ACE 4X5 VEL STRL LF (GAUZE/BANDAGES/DRESSINGS) ×3 IMPLANT
BANDAGE ACE 6X5 VEL STRL LF (GAUZE/BANDAGES/DRESSINGS) ×3 IMPLANT
BASKET HEART  (ORDER IN 25'S) (MISCELLANEOUS) ×1
BASKET HEART (ORDER IN 25'S) (MISCELLANEOUS) ×1
BASKET HEART (ORDER IN 25S) (MISCELLANEOUS) ×1 IMPLANT
BLADE NEEDLE 3 SS STRL (BLADE) ×2 IMPLANT
BLADE NEEDLE 3MM SS STRL (BLADE) ×1
BLADE STERNUM SYSTEM 6 (BLADE) ×3 IMPLANT
BLADE SURG 11 STRL SS (BLADE) ×3 IMPLANT
BNDG GAUZE ELAST 4 BULKY (GAUZE/BANDAGES/DRESSINGS) ×3 IMPLANT
CANISTER SUCT 3000ML PPV (MISCELLANEOUS) ×3 IMPLANT
CATH ROBINSON RED A/P 18FR (CATHETERS) ×6 IMPLANT
CATH THORACIC 28FR (CATHETERS) ×3 IMPLANT
CATH THORACIC 36FR (CATHETERS) ×3 IMPLANT
CATH THORACIC 36FR RT ANG (CATHETERS) ×3 IMPLANT
CLIP VESOCCLUDE SM WIDE 24/CT (CLIP) ×3 IMPLANT
CRADLE DONUT ADULT HEAD (MISCELLANEOUS) ×3 IMPLANT
DRAPE CARDIOVASCULAR INCISE (DRAPES) ×2
DRAPE SLUSH/WARMER DISC (DRAPES) ×3 IMPLANT
DRAPE SRG 135X102X78XABS (DRAPES) ×1 IMPLANT
DRSG COVADERM 4X14 (GAUZE/BANDAGES/DRESSINGS) ×3 IMPLANT
ELECT CAUTERY BLADE 6.4 (BLADE) ×3 IMPLANT
ELECT REM PT RETURN 9FT ADLT (ELECTROSURGICAL) ×6
ELECTRODE REM PT RTRN 9FT ADLT (ELECTROSURGICAL) ×2 IMPLANT
FELT TEFLON 1X6 (MISCELLANEOUS) ×3 IMPLANT
GAUZE SPONGE 4X4 12PLY STRL (GAUZE/BANDAGES/DRESSINGS) ×6 IMPLANT
GLOVE BIO SURGEON STRL SZ 6 (GLOVE) ×3 IMPLANT
GLOVE BIO SURGEON STRL SZ 6.5 (GLOVE) ×12 IMPLANT
GLOVE BIO SURGEON STRL SZ7 (GLOVE) IMPLANT
GLOVE BIO SURGEON STRL SZ7.5 (GLOVE) IMPLANT
GLOVE BIO SURGEONS STRL SZ 6.5 (GLOVE) ×6
GLOVE BIOGEL PI IND STRL 6 (GLOVE) IMPLANT
GLOVE BIOGEL PI IND STRL 6.5 (GLOVE) ×4 IMPLANT
GLOVE BIOGEL PI IND STRL 7.0 (GLOVE) IMPLANT
GLOVE BIOGEL PI INDICATOR 6 (GLOVE)
GLOVE BIOGEL PI INDICATOR 6.5 (GLOVE) ×8
GLOVE BIOGEL PI INDICATOR 7.0 (GLOVE)
GLOVE EUDERMIC 7 POWDERFREE (GLOVE) ×6 IMPLANT
GLOVE ORTHO TXT STRL SZ7.5 (GLOVE) IMPLANT
GOWN STRL REUS W/ TWL LRG LVL3 (GOWN DISPOSABLE) ×6 IMPLANT
GOWN STRL REUS W/ TWL XL LVL3 (GOWN DISPOSABLE) ×1 IMPLANT
GOWN STRL REUS W/TWL LRG LVL3 (GOWN DISPOSABLE) ×12
GOWN STRL REUS W/TWL XL LVL3 (GOWN DISPOSABLE) ×2
HEMOSTAT POWDER SURGIFOAM 1G (HEMOSTASIS) ×9 IMPLANT
HEMOSTAT SURGICEL 2X14 (HEMOSTASIS) ×3 IMPLANT
KIT BASIN OR (CUSTOM PROCEDURE TRAY) ×3 IMPLANT
KIT CATH CPB BARTLE (MISCELLANEOUS) ×3 IMPLANT
KIT ROOM TURNOVER OR (KITS) ×3 IMPLANT
KIT SUCTION CATH 14FR (SUCTIONS) ×3 IMPLANT
KIT VASOVIEW HEMOPRO VH 3000 (KITS) ×3 IMPLANT
NS IRRIG 1000ML POUR BTL (IV SOLUTION) ×18 IMPLANT
PACK OPEN HEART (CUSTOM PROCEDURE TRAY) ×3 IMPLANT
PAD ARMBOARD 7.5X6 YLW CONV (MISCELLANEOUS) ×6 IMPLANT
PAD ELECT DEFIB RADIOL ZOLL (MISCELLANEOUS) ×3 IMPLANT
PENCIL BUTTON HOLSTER BLD 10FT (ELECTRODE) ×3 IMPLANT
PUNCH AORTIC ROTATE 4.0MM (MISCELLANEOUS) IMPLANT
PUNCH AORTIC ROTATE 4.5MM 8IN (MISCELLANEOUS) ×3 IMPLANT
PUNCH AORTIC ROTATE 5MM 8IN (MISCELLANEOUS) IMPLANT
SET CARDIOPLEGIA MPS 5001102 (MISCELLANEOUS) ×3 IMPLANT
SOLUTION ANTI FOG 6CC (MISCELLANEOUS) ×3 IMPLANT
SPONGE INTESTINAL PEANUT (DISPOSABLE) IMPLANT
SPONGE LAP 18X18 X RAY DECT (DISPOSABLE) IMPLANT
SPONGE LAP 4X18 X RAY DECT (DISPOSABLE) ×3 IMPLANT
SUT BONE WAX W31G (SUTURE) ×3 IMPLANT
SUT ETHIBOND 2 0 SH (SUTURE) ×8
SUT ETHIBOND 2 0 SH 36X2 (SUTURE) ×4 IMPLANT
SUT MNCRL AB 4-0 PS2 18 (SUTURE) ×3 IMPLANT
SUT PROLENE 3 0 SH DA (SUTURE) IMPLANT
SUT PROLENE 3 0 SH1 36 (SUTURE) ×3 IMPLANT
SUT PROLENE 4 0 RB 1 (SUTURE) ×2
SUT PROLENE 4 0 SH DA (SUTURE) IMPLANT
SUT PROLENE 4-0 RB1 .5 CRCL 36 (SUTURE) ×1 IMPLANT
SUT PROLENE 5 0 C 1 36 (SUTURE) ×6 IMPLANT
SUT PROLENE 6 0 C 1 30 (SUTURE) ×9 IMPLANT
SUT PROLENE 7 0 BV 1 (SUTURE) IMPLANT
SUT PROLENE 7 0 BV1 MDA (SUTURE) ×6 IMPLANT
SUT PROLENE 8 0 BV175 6 (SUTURE) ×6 IMPLANT
SUT SILK  1 MH (SUTURE) ×6
SUT SILK 1 MH (SUTURE) ×3 IMPLANT
SUT SILK 1 TIES 10X30 (SUTURE) ×3 IMPLANT
SUT SILK 2 0 SH CR/8 (SUTURE) ×6 IMPLANT
SUT SILK 2 0 TIES 10X30 (SUTURE) ×3 IMPLANT
SUT SILK 2 0 TIES 17X18 (SUTURE) ×2
SUT SILK 2-0 18XBRD TIE BLK (SUTURE) ×1 IMPLANT
SUT SILK 3 0 SH CR/8 (SUTURE) ×3 IMPLANT
SUT SILK 4 0 TIE 10X30 (SUTURE) ×6 IMPLANT
SUT STEEL 6MS V (SUTURE) ×6 IMPLANT
SUT TEM PAC WIRE 2 0 SH (SUTURE) ×12 IMPLANT
SUT VIC AB 1 CTX 36 (SUTURE) ×4
SUT VIC AB 1 CTX36XBRD ANBCTR (SUTURE) ×2 IMPLANT
SUT VIC AB 2-0 CT1 27 (SUTURE) ×2
SUT VIC AB 2-0 CT1 TAPERPNT 27 (SUTURE) ×1 IMPLANT
SUT VIC AB 2-0 CTX 27 (SUTURE) ×6 IMPLANT
SUT VIC AB 3-0 SH 27 (SUTURE)
SUT VIC AB 3-0 SH 27X BRD (SUTURE) IMPLANT
SUT VIC AB 3-0 X1 27 (SUTURE) ×6 IMPLANT
SUT VICRYL 4-0 PS2 18IN ABS (SUTURE) IMPLANT
SYSTEM SAHARA CHEST DRAIN ATS (WOUND CARE) ×3 IMPLANT
TAPE CLOTH SURG 4X10 WHT LF (GAUZE/BANDAGES/DRESSINGS) ×3 IMPLANT
TAPE PAPER 2X10 WHT MICROPORE (GAUZE/BANDAGES/DRESSINGS) ×3 IMPLANT
TOWEL GREEN STERILE (TOWEL DISPOSABLE) ×3 IMPLANT
TOWEL GREEN STERILE FF (TOWEL DISPOSABLE) ×3 IMPLANT
TRAY FOLEY SILVER 16FR TEMP (SET/KITS/TRAYS/PACK) ×3 IMPLANT
TUBING INSUFFLATION (TUBING) ×3 IMPLANT
UNDERPAD 30X30 (UNDERPADS AND DIAPERS) ×3 IMPLANT
WATER STERILE IRR 1000ML POUR (IV SOLUTION) ×6 IMPLANT

## 2016-12-17 NOTE — Progress Notes (Signed)
Pt failed wean due to dec PH 7.28, SPO2 90%, RR 30s.

## 2016-12-17 NOTE — Transfer of Care (Signed)
Immediate Anesthesia Transfer of Care Note  Patient: Michelle [name]  Procedure(s) Performed: Procedure(s): CORONARY ARTERY BYPASS GRAFTING (CABG) x 4, using left internal mammary artery and right greater saphenous vein harvested endoscopically (N/A) INTRAOPERATIVE TRANSESOPHAGEAL ECHOCARDIOGRAM (N/A)  Patient Location: SICU  Anesthesia Type:General  Level of Consciousness: Patient remains intubated per anesthesia plan  Airway & Oxygen Therapy: Patient remains intubated per anesthesia plan  Post-op Assessment: Post -op Vital signs reviewed and stable  Post vital signs: stable  Last Vitals:  Vitals:   12/16/16 1933 12/17/16 0355  BP: 130/87 120/71  Pulse: 85 81  Resp: 18   Temp: 36.8 C 36.8 C    Last Pain:  Vitals:   12/17/16 0355  TempSrc: Oral  PainSc:       Patients Stated Pain Goal: 3 (16/24/46 9507)  Complications: No apparent anesthesia complications

## 2016-12-17 NOTE — Anesthesia Postprocedure Evaluation (Signed)
Anesthesia Post Note  Patient: Michelle [name]  Procedure(s) Performed: Procedure(s) (LRB): CORONARY ARTERY BYPASS GRAFTING (CABG) x 4, using left internal mammary artery and right greater saphenous vein harvested endoscopically (N/A) INTRAOPERATIVE TRANSESOPHAGEAL ECHOCARDIOGRAM (N/A)     Patient location during evaluation: SICU Anesthesia Type: General Level of consciousness: sedated and patient remains intubated per anesthesia plan Pain management: pain level controlled Vital Signs Assessment: post-procedure vital signs reviewed and stable Respiratory status: patient remains intubated per anesthesia plan Cardiovascular status: stable Anesthetic complications: no    Last Vitals:  Vitals:   12/17/16 1415 12/17/16 1430  BP:    Pulse: 88 89  Resp: 12 20  Temp: (!) 35.7 C (!) 35.8 C    Last Pain:  Vitals:   12/17/16 0355  TempSrc: Oral  PainSc:                  Randee Huston,JAMES TERRILL

## 2016-12-17 NOTE — Progress Notes (Signed)
Pt has remained stable, no chest pain, no ectopy noted on telemetry. Surgical prep completed per orders, premeds given (metoprolol 12.5mg  and Valium 5mg ) given per orders. Jessie Foot, RN

## 2016-12-17 NOTE — Progress Notes (Signed)
Patient ID: Michelle [name], female   DOB: [birth year], 56 y.o.   MRN: [id number] Cardiothoracic Surgery  She has done well over the weekend with no chest pain or shortness of breath. Ready for CABG this am.

## 2016-12-17 NOTE — Anesthesia Procedure Notes (Addendum)
Central Venous Catheter Insertion Performed by: Rica Koyanagi, anesthesiologist Patient location: Pre-op. Preanesthetic checklist: patient identified, IV checked, site marked, risks and benefits discussed, surgical consent, monitors and equipment checked, pre-op evaluation, timeout performed and anesthesia consent Position: Trendelenburg Lidocaine 1% used for infiltration and patient sedated Hand hygiene performed  and maximum sterile barriers used  Catheter size: 8.5 Fr PA cath was placed.Sheath introducer Swan type:thermodilution Procedure performed using ultrasound guided technique. Ultrasound Notes:anatomy identified, needle tip was noted to be adjacent to the nerve/plexus identified, no ultrasound evidence of intravascular and/or intraneural injection and image(s) printed for medical record Attempts: 1 Following insertion, line sutured and dressing applied. Post procedure assessment: blood return through all ports, free fluid flow and no air  Patient tolerated the procedure well with no immediate complications.

## 2016-12-17 NOTE — Anesthesia Procedure Notes (Addendum)
Procedure Name: Intubation Date/Time: 12/17/2016 7:50 AM Performed by: Lavell Luster Pre-anesthesia Checklist: Patient identified, Emergency Drugs available, Suction available, Patient being monitored and Timeout performed Patient Re-evaluated:Patient Re-evaluated prior to induction Oxygen Delivery Method: Circle system utilized Preoxygenation: Pre-oxygenation with 100% oxygen Induction Type: IV induction Ventilation: Mask ventilation without difficulty Laryngoscope Size: Mac and 3 Grade View: Grade I Tube type: Oral Tube size: 7.5 mm Number of attempts: 1 Airway Equipment and Method: Stylet Placement Confirmation: ETT inserted through vocal cords under direct vision,  positive ETCO2 and breath sounds checked- equal and bilateral Secured at: 19 cm Tube secured with: Tape Dental Injury: Teeth and Oropharynx as per pre-operative assessment

## 2016-12-17 NOTE — Brief Op Note (Signed)
12/12/2016 - 12/17/2016  11:55 AM  PATIENT:  Michelle [name]  56 y.o. female  PRE-OPERATIVE DIAGNOSIS:  CAD  POST-OPERATIVE DIAGNOSIS:  CAD  PROCEDURE:  Procedure(s): CORONARY ARTERY BYPASS GRAFTING (CABG) x 4, using left internal mammary artery and right greater saphenous vein harvested endoscopically (N/A) INTRAOPERATIVE TRANSESOPHAGEAL ECHOCARDIOGRAM (N/A)  SURGEON:  Surgeon(s) and Role:    * Bartle, Fernande Boyden, MD - Primary  PHYSICIAN ASSISTANT:  Nicholes Rough, PA-C  ANESTHESIA:   general  EBL:  Total I/O In: 1500 [I.V.:1500] Out: 7670 [Urine:1375]  BLOOD ADMINISTERED:none  DRAINS: routine   LOCAL MEDICATIONS USED:  NONE  SPECIMEN:  No Specimen  DISPOSITION OF SPECIMEN:  PATHOLOGY  COUNTS:  YES  TOURNIQUET:  * No tourniquets in log *  DICTATION: .Dragon Dictation  PLAN OF CARE: Admit to inpatient   PATIENT DISPOSITION:  ICU - intubated and hemodynamically stable.   Delay start of Pharmacological VTE agent (>24hrs) due to surgical blood loss or risk of bleeding: yes

## 2016-12-17 NOTE — Op Note (Signed)
CARDIOVASCULAR SURGERY OPERATIVE NOTE  12/17/2016  Surgeon:  Gaye Pollack, MD  First Assistant: Nicholes Rough,  PA-C   Preoperative Diagnosis:  Severe multi-vessel coronary artery disease   Postoperative Diagnosis:  Same   Procedure:  1. Median Sternotomy 2. Extracorporeal circulation 3.   Coronary artery bypass grafting x 4   Left internal mammary graft to the LAD  SVG to diagonal  Sequential SVG to PDA and PL  4.   Endoscopic vein harvest from the right leg   Anesthesia:  General Endotracheal   Clinical History/Surgical Indication:  The patient is a 56 year old woman with hyperlipidemia, hypertension, and ongoing smoking who was evaluated by Dr. Acie Fredrickson one month ago for edema in her legs and elevated BNP as well as substernal burning pain that she thought was heartburn. She was initially seen by her PCP and started on lasix with improvement in her edema. She underwent a nuclear stress test on 7/12 that was abnormal showing inferior and inferolateral ischemia. She was scheduled for echo and cath yesterday. Her echo showed an EF of 60-65% with grade 1 diastolic dysfunction. Valves ok. Cath yesterday showed severe 2-vessel CAD with 80% proximal to mid LAD, 75% ostial diagonal, 95% distal RCA bifurcation stenosis extending into PDA and distal RCA.  She has severe 2-vessel CAD with unstable angina and NYHA class III congestive heart failure symptoms with a normal LVEF on echo. I agree that CABG is the best treatment for her. I reviewed her echo and cath studies with her and her daughter. I discussed the operative procedure with the patient and her daughter including alternatives, benefits and risks; including but not limited to bleeding, blood transfusion, infection, stroke, myocardial infarction, graft failure, heart block requiring a permanent pacemaker, organ dysfunction, and death.   [name] understands and agrees to proceed.    Preparation:  The patient was seen in the preoperative holding area and the correct patient, correct operation were confirmed with the patient after reviewing the medical record and catheterization. The consent was signed by me. Preoperative antibiotics were given. A pulmonary arterial line and radial arterial line were placed by the anesthesia team. The patient was taken back to the operating room and positioned supine on the operating room table. After being placed under general endotracheal anesthesia by the anesthesia team a foley catheter was placed. The neck, chest, abdomen, and both legs were prepped with betadine soap and solution and draped in the usual sterile manner. A surgical time-out was taken and the correct patient and operative procedure were confirmed with the nursing and anesthesia staff.   Cardiopulmonary Bypass:  A median sternotomy was performed. The pericardium was opened in the midline. Right ventricular function appeared normal. The ascending aorta was of normal size and had no palpable plaque. There were no contraindications to aortic cannulation or cross-clamping. The patient was fully systemically heparinized and the ACT was maintained > 400 sec. The proximal aortic arch was cannulated with a 20 F aortic cannula for arterial inflow. Venous cannulation was performed via the right atrial appendage using a two-staged venous cannula. An antegrade cardioplegia/vent cannula was inserted into the mid-ascending aorta. Aortic occlusion was performed with a single cross-clamp. Systemic cooling to 32 degrees Centigrade and topical cooling of the heart with iced saline were used. Hyperkalemic antegrade cold blood cardioplegia was used to induce diastolic arrest and was then given at about 20 minute intervals throughout the period of arrest to maintain myocardial temperature at or below 10 degrees  centigrade. A temperature probe was  inserted into the interventricular septum and an insulating pad was placed in the pericardium.   Left internal mammary harvest:  The left side of the sternum was retracted using the Rultract retractor. The left internal mammary artery was harvested as a pedicle graft. All side branches were clipped. It was a medium-sized vessel of good quality with excellent blood flow. It was ligated distally and divided. It was sprayed with topical papaverine solution to prevent vasospasm.   Endoscopic vein harvest:  The right greater saphenous vein was harvested endoscopically through a 2 cm incision medial to the right knee. It was harvested from the upper thigh to below the knee. It was a medium-sized vein of good quality. The side branches were all ligated with 4-0 silk ties.    Coronary arteries:  The coronary arteries were examined.   LAD:  Medium caliber vessel with heavy proximal disease but not distal disease. Diagonal was small but graftable  LCX:  No significant disease  RCA:  PDA medium sized proximally but had diffuse segmental plaque. The PL1 was small and borderline for grafting. It looked better on cath than in the OR. PL2 was tiny.   Grafts:  1. LIMA to the LAD: 1.75 mm. It was sewn end to side using 8-0 prolene continuous suture. 2. SVG to diagonal:  1.6 mm. It was sewn end to side using 7-0 prolene continuous suture. 3. Sequential SVG to PDA:  1.6 mm. It was sewn sequential side to side using 7-0 prolene continuous suture. 4. Sequential SVG to PL:  1.0 mm. It was sewn sequential end to side using 7-0 prolene continuous suture. This vessel is marginal.  The proximal vein graft anastomoses were performed to the mid-ascending aorta using continuous 6-0 prolene suture. Graft markers were placed around the proximal anastomoses.   Completion:  The patient was rewarmed to 37 degrees Centigrade. The clamp was removed from the LIMA pedicle and there was rapid warming of the septum and  return of ventricular fibrillation. The crossclamp was removed with a time of 94 minutes. There was spontaneous return of sinus rhythm. The distal and proximal anastomoses were checked for hemostasis. The position of the grafts was satisfactory. Two temporary epicardial pacing wires were placed on the right atrium and two on the right ventricle. The patient was weaned from CPB without difficulty on no inotropes. CPB time was 113 minutes. Cardiac output was 5 LPM. Heparin was fully reversed with protamine and the aortic and venous cannulas removed. Hemostasis was achieved. Mediastinal and left pleural drainage tubes were placed. The sternum was closed with double #6 stainless steel wires. The fascia was closed with continuous # 1 vicryl suture. The subcutaneous tissue was closed with 2-0 vicryl continuous suture. The skin was closed with 3-0 vicryl subcuticular suture. All sponge, needle, and instrument counts were reported correct at the end of the case. Dry sterile dressings were placed over the incisions and around the chest tubes which were connected to pleurevac suction. The patient was then transported to the surgical intensive care unit in critical but stable condition.

## 2016-12-17 NOTE — Progress Notes (Signed)
Initiated Open Heart Rapid Wean per Protocol. Pt VS within normal limits. RN at bedside. RT will continue to monitor.

## 2016-12-17 NOTE — Progress Notes (Signed)
CT surgery p.m. Rounds  Patient anxious tachypneic with low tidal volumes and low O2 sats on ventilator Not ready for extubation We'll resume precedex and give prn Versed. Try again later. Xopenex nebulize therapy started for her COPD, low O2 sat

## 2016-12-17 NOTE — Progress Notes (Signed)
RT attempted wean for 2nd time. ABG below acceptable parameters per Protocol (PaO2 61, SaO2 88%). Pt placed back on SIMV/PRVC R 10 fio2 50%. Will attempt wean again in 1 hour. RN at bedside and aware of plan.  RT will continue to closely monitor.

## 2016-12-17 NOTE — Anesthesia Procedure Notes (Signed)
Arterial Line Insertion Start/End7/23/2018 6:45 AM Performed by: Lavell Luster, CRNA  Patient location: Pre-op. Preanesthetic checklist: patient identified, IV checked, site marked, risks and benefits discussed, surgical consent, monitors and equipment checked, pre-op evaluation and timeout performed Lidocaine 1% used for infiltration Left, radial was placed Catheter size: 20 G Hand hygiene performed , maximum sterile barriers used  and Seldinger technique used Allen's test indicative of satisfactory collateral circulation Attempts: 1 Procedure performed without using ultrasound guided technique. Following insertion, dressing applied and Biopatch. Post procedure assessment: unchanged  Patient tolerated the procedure well with no immediate complications.

## 2016-12-17 NOTE — Progress Notes (Signed)
Michelle Gum MD round to see the patient this evening. Patient with increased anxiety and agitation on Precedex gtt and need for additional FiO2 up to 70% based on low pO2 of 58 and sO2 of 88%. Verbal orders received from MD to increase Precedex gtt to 1.0 and keep patient intubated overnight. Will continue to monitor the patient closely.   Michelle [name]

## 2016-12-17 NOTE — Progress Notes (Addendum)
  Echocardiogram Echocardiogram Transesophageal has been performed.  Michelle [name] M 12/17/2016, 8:17 AM

## 2016-12-18 ENCOUNTER — Encounter (HOSPITAL_COMMUNITY): Payer: Self-pay | Admitting: Surgery

## 2016-12-18 ENCOUNTER — Inpatient Hospital Stay (HOSPITAL_COMMUNITY): Payer: BLUE CROSS/BLUE SHIELD

## 2016-12-18 LAB — MAGNESIUM
Magnesium: 2.4 mg/dL (ref 1.7–2.4)
Magnesium: 2 mg/dL (ref 1.7–2.4)

## 2016-12-18 LAB — POCT I-STAT, CHEM 8
BUN: 12 mg/dL (ref 6–20)
CHLORIDE: 102 mmol/L (ref 101–111)
CREATININE: 0.5 mg/dL (ref 0.44–1.00)
Calcium, Ion: 1.11 mmol/L — ABNORMAL LOW (ref 1.15–1.40)
GLUCOSE: 120 mg/dL — AB (ref 65–99)
HCT: 26 % — ABNORMAL LOW (ref 36.0–46.0)
HEMOGLOBIN: 8.8 g/dL — AB (ref 12.0–15.0)
POTASSIUM: 3.7 mmol/L (ref 3.5–5.1)
Sodium: 138 mmol/L (ref 135–145)
TCO2: 19 mmol/L (ref 0–100)

## 2016-12-18 LAB — GLUCOSE, CAPILLARY
GLUCOSE-CAPILLARY: 115 mg/dL — AB (ref 65–99)
GLUCOSE-CAPILLARY: 134 mg/dL — AB (ref 65–99)
GLUCOSE-CAPILLARY: 104 mg/dL — AB (ref 65–99)
GLUCOSE-CAPILLARY: 99 mg/dL (ref 65–99)
GLUCOSE-CAPILLARY: 123 mg/dL — AB (ref 65–99)
Glucose-Capillary: 116 mg/dL — ABNORMAL HIGH (ref 65–99)
Glucose-Capillary: 109 mg/dL — ABNORMAL HIGH (ref 65–99)
Glucose-Capillary: 107 mg/dL — ABNORMAL HIGH (ref 65–99)
Glucose-Capillary: 108 mg/dL — ABNORMAL HIGH (ref 65–99)
Glucose-Capillary: 131 mg/dL — ABNORMAL HIGH (ref 65–99)
Glucose-Capillary: 94 mg/dL (ref 65–99)
Glucose-Capillary: 105 mg/dL — ABNORMAL HIGH (ref 65–99)

## 2016-12-18 LAB — CBC
HCT: 25.7 % — ABNORMAL LOW (ref 36.0–46.0)
HEMATOCRIT: 26.8 % — AB (ref 36.0–46.0)
HEMOGLOBIN: 8.6 g/dL — AB (ref 12.0–15.0)
Hemoglobin: 8.2 g/dL — ABNORMAL LOW (ref 12.0–15.0)
MCH: 28.7 pg (ref 26.0–34.0)
MCH: 28.5 pg (ref 26.0–34.0)
MCHC: 32.1 g/dL (ref 30.0–36.0)
MCHC: 31.9 g/dL (ref 30.0–36.0)
MCV: 89.3 fL (ref 78.0–100.0)
MCV: 89.2 fL (ref 78.0–100.0)
PLATELETS: 233 10*3/uL (ref 150–400)
Platelets: 228 10*3/uL (ref 150–400)
RBC: 3 MIL/uL — AB (ref 3.87–5.11)
RBC: 2.88 MIL/uL — AB (ref 3.87–5.11)
RDW: 15 % (ref 11.5–15.5)
RDW: 15.4 % (ref 11.5–15.5)
WBC: 14.6 10*3/uL — AB (ref 4.0–10.5)
WBC: 17.7 10*3/uL — ABNORMAL HIGH (ref 4.0–10.5)

## 2016-12-18 LAB — POCT I-STAT 3, ART BLOOD GAS (G3+)
ACID-BASE DEFICIT: 2 mmol/L (ref 0.0–2.0)
ACID-BASE DEFICIT: 5 mmol/L — AB (ref 0.0–2.0)
Acid-base deficit: 5 mmol/L — ABNORMAL HIGH (ref 0.0–2.0)
Bicarbonate: 22.5 mmol/L (ref 20.0–28.0)
Bicarbonate: 19.7 mmol/L — ABNORMAL LOW (ref 20.0–28.0)
Bicarbonate: 18.7 mmol/L — ABNORMAL LOW (ref 20.0–28.0)
O2 SAT: 90 %
O2 Saturation: 94 %
O2 Saturation: 91 %
PCO2 ART: 38.3 mmHg (ref 32.0–48.0)
PCO2 ART: 33.1 mmHg (ref 32.0–48.0)
PCO2 ART: 31.5 mmHg — AB (ref 32.0–48.0)
PH ART: 7.38 (ref 7.350–7.450)
PH ART: 7.386 (ref 7.350–7.450)
PH ART: 7.384 (ref 7.350–7.450)
PO2 ART: 76 mmHg — AB (ref 83.0–108.0)
PO2 ART: 63 mmHg — AB (ref 83.0–108.0)
Patient temperature: 37.7
Patient temperature: 37.8
Patient temperature: 37.5
TCO2: 24 mmol/L (ref 0–100)
TCO2: 21 mmol/L (ref 0–100)
TCO2: 20 mmol/L (ref 0–100)
pO2, Arterial: 61 mmHg — ABNORMAL LOW (ref 83.0–108.0)

## 2016-12-18 LAB — CREATININE, SERUM
CREATININE: 0.79 mg/dL (ref 0.44–1.00)
GFR calc Af Amer: >60 mL/min (ref >60)
GFR calc non Af Amer: >60 mL/min (ref >60)

## 2016-12-18 LAB — BASIC METABOLIC PANEL
ANION GAP: 7 (ref 5–15)
BUN: 10 mg/dL (ref 6–20)
CHLORIDE: 108 mmol/L (ref 101–111)
CO2: 23 mmol/L (ref 22–32)
Calcium: 8.2 mg/dL — ABNORMAL LOW (ref 8.9–10.3)
Creatinine, Ser: 0.71 mg/dL (ref 0.44–1.00)
GFR calc Af Amer: >60 mL/min (ref >60)
GLUCOSE: 110 mg/dL — AB (ref 65–99)
Potassium: 3.7 mmol/L (ref 3.5–5.1)
Sodium: 138 mmol/L (ref 135–145)

## 2016-12-18 LAB — HEMOGLOBIN A1C
Hgb A1c MFr Bld: 5.1 % (ref 4.8–5.6)
Hgb A1c MFr Bld: 5.1 % (ref 4.8–5.6)
MEAN PLASMA GLUCOSE: 100 mg/dL
Mean Plasma Glucose: 100 mg/dL

## 2016-12-18 MED ORDER — POTASSIUM CHLORIDE 10 MEQ/50ML IV SOLN
10.0000 meq | INTRAVENOUS | Status: AC
  Administered 2016-12-18 (×2): 10 meq via INTRAVENOUS

## 2016-12-18 MED ORDER — POTASSIUM CHLORIDE 10 MEQ/50ML IV SOLN
10.0000 meq | INTRAVENOUS | Status: DC | PRN
  Administered 2016-12-18: 10 meq via INTRAVENOUS
  Filled 2016-12-18 (×2): qty 50

## 2016-12-18 MED ORDER — CHLORHEXIDINE GLUCONATE CLOTH 2 % EX PADS
6.0000 | MEDICATED_PAD | Freq: Every day | CUTANEOUS | Status: AC
  Administered 2016-12-18 – 2016-12-22 (×5): 6 via TOPICAL

## 2016-12-18 MED ORDER — LEVALBUTEROL HCL 1.25 MG/0.5ML IN NEBU
1.2500 mg | INHALATION_SOLUTION | Freq: Three times a day (TID) | RESPIRATORY_TRACT | Status: DC
  Administered 2016-12-19: 1.25 mg via RESPIRATORY_TRACT
  Filled 2016-12-18 (×2): qty 0.5

## 2016-12-18 MED ORDER — SODIUM CHLORIDE 0.9% FLUSH: 10.0000 mL | INTRAVENOUS | Status: DC | PRN

## 2016-12-18 MED ORDER — INSULIN ASPART 100 UNIT/ML ~~LOC~~ SOLN
0.0000 [IU] | SUBCUTANEOUS | Status: DC
  Administered 2016-12-18 (×2): 2 [IU] via SUBCUTANEOUS

## 2016-12-18 MED ORDER — SODIUM CHLORIDE 0.9 % IV SOLN
0.0000 ug/min | INTRAVENOUS | Status: DC
  Administered 2016-12-19: 80 ug/min via INTRAVENOUS
  Filled 2016-12-18: qty 4

## 2016-12-18 MED ORDER — SODIUM CHLORIDE 0.9% FLUSH
10.0000 mL | Freq: Two times a day (BID) | INTRAVENOUS | Status: DC
  Administered 2016-12-18 – 2016-12-19 (×2): 10 mL

## 2016-12-18 MED ORDER — MUPIROCIN 2 % EX OINT
1.0000 "application " | TOPICAL_OINTMENT | Freq: Two times a day (BID) | CUTANEOUS | Status: AC
  Administered 2016-12-18 – 2016-12-22 (×9): 1 via NASAL
  Filled 2016-12-18 (×4): qty 22

## 2016-12-18 MED ORDER — POTASSIUM CHLORIDE 10 MEQ/50ML IV SOLN
10.0000 meq | INTRAVENOUS | Status: AC
  Administered 2016-12-18 (×3): 10 meq via INTRAVENOUS
  Filled 2016-12-18: qty 50

## 2016-12-18 MED ORDER — ORAL CARE MOUTH RINSE
15.0000 mL | Freq: Two times a day (BID) | OROMUCOSAL | Status: DC
  Administered 2016-12-21 – 2016-12-22 (×4): 15 mL via OROMUCOSAL

## 2016-12-18 MED ORDER — ENOXAPARIN SODIUM 40 MG/0.4ML ~~LOC~~ SOLN
40.0000 mg | Freq: Every day | SUBCUTANEOUS | Status: DC
  Administered 2016-12-20 – 2016-12-22 (×3): 40 mg via SUBCUTANEOUS
  Filled 2016-12-18 (×5): qty 0.4

## 2016-12-18 MED ORDER — FUROSEMIDE 10 MG/ML IJ SOLN
40.0000 mg | Freq: Once | INTRAMUSCULAR | Status: AC
  Administered 2016-12-18: 40 mg via INTRAVENOUS

## 2016-12-18 MED ORDER — CHLORHEXIDINE GLUCONATE CLOTH 2 % EX PADS: 6.0000 | MEDICATED_PAD | Freq: Every day | CUTANEOUS | Status: DC

## 2016-12-18 MED ORDER — ORAL CARE MOUTH RINSE
15.0000 mL | Freq: Two times a day (BID) | OROMUCOSAL | Status: DC
  Administered 2016-12-18: 15 mL via OROMUCOSAL

## 2016-12-18 MED FILL — Albumin, Human Inj 5%: INTRAVENOUS | Qty: 250 | Status: AC

## 2016-12-18 MED FILL — Magnesium Sulfate Inj 50%: INTRAMUSCULAR | Qty: 10 | Status: AC

## 2016-12-18 MED FILL — Mannitol IV Soln 20%: INTRAVENOUS | Qty: 500 | Status: AC

## 2016-12-18 MED FILL — Potassium Chloride Inj 2 mEq/ML: INTRAVENOUS | Qty: 20 | Status: AC

## 2016-12-18 MED FILL — Sodium Chloride IV Soln 0.9%: INTRAVENOUS | Qty: 2000 | Status: AC

## 2016-12-18 MED FILL — Electrolyte-R (PH 7.4) Solution: INTRAVENOUS | Qty: 4000 | Status: AC

## 2016-12-18 MED FILL — Heparin Sodium (Porcine) Inj 1000 Unit/ML: INTRAMUSCULAR | Qty: 30 | Status: AC

## 2016-12-18 MED FILL — Heparin Sodium (Porcine) Inj 1000 Unit/ML: INTRAMUSCULAR | Qty: 10 | Status: AC

## 2016-12-18 MED FILL — Lidocaine HCl IV Inj 20 MG/ML: INTRAVENOUS | Qty: 5 | Status: AC

## 2016-12-18 MED FILL — Sodium Bicarbonate IV Soln 8.4%: INTRAVENOUS | Qty: 50 | Status: AC

## 2016-12-18 NOTE — Progress Notes (Signed)
Patient very agitated, refusing meds, refusing all treatment. Patient with verbal and auditory hallucinations. Patient unable to redirect. Will notify MD.

## 2016-12-18 NOTE — Progress Notes (Signed)
Patient ID: Michelle [name], female   DOB: 17-May-1961, 56 y.o.   MRN: [id number] SICU Evening Rounds  BP low normal and still on neo 70 mcg and dop 2. Wean as tolerate.  She was fine early today and then developed paranoid delirium. Daughter is with her now and she is much calmer.  Urine output ok  CBC    Component Value Date/Time   WBC 17.7 (H) 12/18/2016 1734   RBC 2.88 (L) 12/18/2016 1734   HGB 8.2 (L) 12/18/2016 1734   HCT 25.7 (L) 12/18/2016 1734   PLT 233 12/18/2016 1734   MCV 89.2 12/18/2016 1734   MCH 28.5 12/18/2016 1734   MCHC 31.9 12/18/2016 1734   RDW 15.4 12/18/2016 1734   LYMPHSABS 1.4 12/17/2016 1943   MONOABS 1.0 12/17/2016 1943   EOSABS 0.0 12/17/2016 1943   BASOSABS 0.0 12/17/2016 1943   BMET    Component Value Date/Time   NA 138 12/18/2016 1723   K 3.7 12/18/2016 1723   CL 102 12/18/2016 1723   CO2 23 12/18/2016 0244   GLUCOSE 120 (H) 12/18/2016 1723   BUN 12 12/18/2016 1723   CREATININE 0.79 12/18/2016 1734   CREATININE 0.78 12/05/2016 0823   CALCIUM 8.2 (L) 12/18/2016 0244   GFRNONAA >60 12/18/2016 1734   GFRNONAA 85 12/05/2016 0823   GFRAA >60 12/18/2016 1734   GFRAA >89 12/05/2016 1219

## 2016-12-18 NOTE — Procedures (Signed)
Extubation Procedure Note  Patient Details:   Name: Michelle [name] DOB: 06/04/1960 MRN: [id number]   Airway Documentation:     Evaluation  O2 sats: stable throughout Complications: No apparent complications Patient did tolerate procedure well. Bilateral Breath Sounds: Clear, Diminished, Fine crackles   Yes   Patient extubated to 4LNC, positive cuff leak noted, no stridor noted, patient able to speak, vitals stable, IS instructed, RT will continue to monitor.  Michelle [name] 12/18/2016, 9:15 AM

## 2016-12-18 NOTE — Progress Notes (Signed)
1 Day Post-Op Procedure(s) (LRB): CORONARY ARTERY BYPASS GRAFTING (CABG) x 4, using left internal mammary artery and right greater saphenous vein harvested endoscopically (N/A) INTRAOPERATIVE TRANSESOPHAGEAL ECHOCARDIOGRAM (N/A) Subjective: Just extubated a while ago. No complaints  Objective: Vital signs in last 24 hours: Temp:  [96.3 F (35.7 C)-100 F (37.8 C)] 100 F (37.8 C) (07/24 0900) Pulse Rate:  [87-96] 88 (07/24 0900) Cardiac Rhythm: Atrial paced (07/24 0800) Resp:  [11-45] 31 (07/24 0900) BP: (72-121)/(52-74) 87/60 (07/24 0900) SpO2:  [82 %-100 %] 97 % (07/24 0900) Arterial Line BP: (71-122)/(43-71) 108/62 (07/24 0900) FiO2 (%):  [50 %-70 %] 50 % (07/24 0838) Weight:  [72.7 kg (160 lb 4.4 oz)] 72.7 kg (160 lb 4.4 oz) (07/24 0330)  Hemodynamic parameters for last 24 hours: PAP: (21-40)/(7-27) 26/15 CO:  [2.5 L/min-3.6 L/min] 3.3 L/min CI:  [1.5 L/min/m2-2.1 L/min/m2] 1.9 L/min/m2  Intake/Output from previous day: 07/23 0701 - 07/24 0700 In: 6039 [I.V.:3484; Blood:375; NG/GT:230; IV Piggyback:1950] Out: 3570 [Urine:4455; Emesis/NG output:250; Blood:800; Chest Tube:340] Intake/Output this shift: Total I/O In: 104.2 [I.V.:104.2] Out: 80 [Urine:60; Chest Tube:20]  General appearance: alert and cooperative Neurologic: intact Heart: regular rate and rhythm, S1, S2 normal, no murmur, click, rub or gallop Lungs: clear to auscultation bilaterally Extremities: edema mild Wound: dressings dry  Lab Results:  Recent Labs  12/17/16 1943 12/17/16 1957 12/18/16 0244  WBC 16.1*  --  14.6*  HGB 8.7* 8.8* 8.6*  HCT 27.0* 26.0* 26.8*  PLT 231  --  228   BMET:  Recent Labs  12/17/16 0457  12/17/16 1957 12/18/16 0244  NA 139  < > 140 138  K 4.0  < > 4.5 3.7  CL 107  < > 106 108  CO2 25  --   --  23  GLUCOSE 87  < > 119* 110*  BUN 13  < > 10 10  CREATININE 0.74  < > 0.50 0.71  CALCIUM 9.2  --   --  8.2*  < > = values in this interval not displayed.  PT/INR:   Recent Labs  12/17/16 1330  LABPROT 16.4*  INR 1.32   ABG    Component Value Date/Time   PHART 7.384 12/18/2016 1013   HCO3 18.7 (L) 12/18/2016 1013   TCO2 20 12/18/2016 1013   ACIDBASEDEF 5.0 (H) 12/18/2016 1013   O2SAT 91.0 12/18/2016 1013   CBG (last 3)   Recent Labs  12/18/16 0556 12/18/16 0654 12/18/16 0749  GLUCAP 134* 104* 99    Assessment/Plan: S/P Procedure(s) (LRB): CORONARY ARTERY BYPASS GRAFTING (CABG) x 4, using left internal mammary artery and right greater saphenous vein harvested endoscopically (N/A) INTRAOPERATIVE TRANSESOPHAGEAL ECHOCARDIOGRAM (N/A)  She is still on neo and low dose dopamine. Will hold lopressor for now. Mobilize Diuresis d/c tubes/lines Continue foley due to diuresing patient and patient in ICU See progression orders   LOS: 6 days    Gaye Pollack 12/18/2016

## 2016-12-18 NOTE — Progress Notes (Signed)
4mg  of morphine drawn up but opted against administering the medication given the possibility for extubation this AM. Waste witnessed by Beatrix Shipper RN.    Inis Sizer

## 2016-12-19 ENCOUNTER — Inpatient Hospital Stay (HOSPITAL_COMMUNITY): Payer: BLUE CROSS/BLUE SHIELD | Admitting: Critical Care Medicine

## 2016-12-19 ENCOUNTER — Inpatient Hospital Stay (HOSPITAL_COMMUNITY): Payer: BLUE CROSS/BLUE SHIELD

## 2016-12-19 LAB — BASIC METABOLIC PANEL
ANION GAP: 8 (ref 5–15)
BUN: 11 mg/dL (ref 6–20)
CALCIUM: 8.6 mg/dL — AB (ref 8.9–10.3)
CO2: 21 mmol/L — ABNORMAL LOW (ref 22–32)
Chloride: 107 mmol/L (ref 101–111)
Creatinine, Ser: 0.64 mg/dL (ref 0.44–1.00)
GLUCOSE: 125 mg/dL — AB (ref 65–99)
POTASSIUM: 3.7 mmol/L (ref 3.5–5.1)
SODIUM: 136 mmol/L (ref 135–145)

## 2016-12-19 LAB — CBC
HEMATOCRIT: 25 % — AB (ref 36.0–46.0)
HEMOGLOBIN: 7.9 g/dL — AB (ref 12.0–15.0)
MCH: 28.2 pg (ref 26.0–34.0)
MCHC: 31.6 g/dL (ref 30.0–36.0)
MCV: 89.3 fL (ref 78.0–100.0)
Platelets: 208 10*3/uL (ref 150–400)
RBC: 2.8 MIL/uL — ABNORMAL LOW (ref 3.87–5.11)
RDW: 15.3 % (ref 11.5–15.5)
WBC: 16.1 10*3/uL — AB (ref 4.0–10.5)

## 2016-12-19 LAB — GLUCOSE, CAPILLARY
GLUCOSE-CAPILLARY: 100 mg/dL — AB (ref 65–99)
GLUCOSE-CAPILLARY: 113 mg/dL — AB (ref 65–99)
GLUCOSE-CAPILLARY: 115 mg/dL — AB (ref 65–99)
GLUCOSE-CAPILLARY: 117 mg/dL — AB (ref 65–99)
Glucose-Capillary: 115 mg/dL — ABNORMAL HIGH (ref 65–99)
Glucose-Capillary: 113 mg/dL — ABNORMAL HIGH (ref 65–99)

## 2016-12-19 MED ORDER — FENTANYL CITRATE (PF) 100 MCG/2ML IJ SOLN
INTRAMUSCULAR | Status: AC
  Filled 2016-12-19: qty 2

## 2016-12-19 MED ORDER — LEVALBUTEROL HCL 1.25 MG/0.5ML IN NEBU: 1.2500 mg | INHALATION_SOLUTION | Freq: Four times a day (QID) | RESPIRATORY_TRACT | Status: DC | PRN

## 2016-12-19 MED ORDER — MIDAZOLAM HCL 2 MG/2ML IJ SOLN
2.0000 mg | Freq: Once | INTRAMUSCULAR | Status: DC | PRN
Start: 2016-12-19 — End: 2016-12-20

## 2016-12-19 MED ORDER — ETOMIDATE 2 MG/ML IV SOLN
INTRAVENOUS | Status: DC | PRN
  Administered 2016-12-19: 12 mg via INTRAVENOUS

## 2016-12-19 MED ORDER — MIDAZOLAM HCL 2 MG/2ML IJ SOLN
INTRAMUSCULAR | Status: AC
  Filled 2016-12-19: qty 2

## 2016-12-19 MED ORDER — FUROSEMIDE 10 MG/ML IJ SOLN
40.0000 mg | Freq: Two times a day (BID) | INTRAMUSCULAR | Status: AC
  Administered 2016-12-19 (×2): 40 mg via INTRAVENOUS
  Filled 2016-12-19 (×2): qty 4

## 2016-12-19 MED ORDER — SUCCINYLCHOLINE CHLORIDE 20 MG/ML IJ SOLN
INTRAMUSCULAR | Status: DC | PRN
  Administered 2016-12-19: 100 mg via INTRAVENOUS

## 2016-12-19 MED ORDER — POTASSIUM CHLORIDE 10 MEQ/50ML IV SOLN
10.0000 meq | INTRAVENOUS | Status: AC
  Administered 2016-12-19 (×3): 10 meq via INTRAVENOUS
  Filled 2016-12-19: qty 50

## 2016-12-19 NOTE — Progress Notes (Signed)
While I was in room 16, dietary personal came to room 20 to pass out trays and notified staff that Ms. Zaugg was in the floor.   I rushed to her room to find her in the floor, face down.  Prior to leaving her room (beofre the fall), I double checked the bed alarm to ensure that it was on.  Prior to coming into pt's room, hallway bed alarm notification was not alarming.  Pt was restrained earlier but had sense cleared up and was A&O and very appropriate not needing restraints. On the floor, pt was alert to voice but moaning, but did respond to her name being called. With help, pt was carefully turned and returned to the bed and a code blue was called to intubate due to a decrease in mentation.  Dr. Cyndia Bent was notified immediatly and pt was taking for head CT stat.  Pt's daughter was notified and came to bedside.  After CT, pt was very calm.  Dr. Cyndia Bent ordered to exubate pending that she passed all parameters.  Pt was extubated at 63 to 2L. Pt A&O at this time, some delirium intermittently.  Pt's restraints reapplied,bed alarm on and checked and is now functioning properly and sounding in hallway, daughter at bedside, and order for safety sitter ordered for when pt's daughter leaves this evening.   Please see code blue sheet and charting for more information.  Emotional support provided to pt & to family.

## 2016-12-19 NOTE — Care Management Note (Signed)
Case Management Note  Patient Details  Name: Michelle [name] MRN: [id number] Date of Birth: 03-28-1961  Subjective/Objective:   Pt is from home with support of her mother and grandmother. Pt has DME Cane & RW. She is s/p cath , showing 2 vessel dz POD 2 CABG, neo and dopamine weaned off, she has post op delirium, she takes psych meds at home, she also has ABLA.  May benefit from  psyc to come and see her per MD note.                               Action/Plan: NCM will follow for dc needs.   Expected Discharge Date:                  Expected Discharge Plan:  [place]  In-House Referral:  NA  Discharge planning Services  CM Consult  Post Acute Care Choice:    Choice offered to:     DME Arranged:    DME Agency:     HH Arranged:    HH Agency:     Status of Service:  In process, will continue to follow  If discussed at Long Length of Stay Meetings, dates discussed:    Additional Comments:  Zenon Mayo, RN 12/19/2016, 8:53 AM

## 2016-12-19 NOTE — Progress Notes (Signed)
Patient ID: Michelle [name], female   DOB: September 15, 1960, 56 y.o.   MRN: [id number]  SICU Evening Rounds:  She was doing well this afternoon and walked with nurses. Patient got out of bed by herself this afternoon probably trying to go to bathroom and fell to floor. Bed alarm set but did not go off and may not have made any difference. She was lethargic and intubated but then woke up and did not have any hemodynamic compromise. 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 She has since been extubated and is resting calmly. She has a small ecchymosis over left side of face but I don't see anything on CT in this region.  She had good diuresis today  CXR after intubation today looked good.  Will continue current psych meds and try to get her a sitter. Psychiatry came to see her today but that was when she fell.  Discussed condition and plans with daughter at bedside.

## 2016-12-19 NOTE — Code Documentation (Signed)
  Patient Name: Michelle [name]   MRN: [id number]   Date of Birth/ Sex: 12/21/1960 , female      Admission Date: 12/12/2016  Attending Provider: Gaye Pollack, MD  Primary Diagnosis: <principal problem not specified>   Indication: Pt was in her usual state of health until this PM, when she was noted to be collapsed on the floor and unresponsive. Code blue was subsequently called. At the time of arrival on scene, ACLS protocol was underway.   Technical Description:  - CPR performance duration:  0 minute  - Was defibrillation or cardioversion used? No   - Was external pacer placed? No  - Was patient intubated pre/post CPR? Yes   Medications Administered: Y = Yes; Blank = No Amiodarone    Atropine    Calcium    Epinephrine    Lidocaine    Magnesium    Norepinephrine    Phenylephrine    Sodium bicarbonate    Vasopressin     Post CPR evaluation:  - Final Status - Was patient successfully resuscitated ? Yes - What is current rhythm? Sinus Tachycardia - What is current hemodynamic status? Stable  Miscellaneous Information:  - Labs sent, including: None  - Primary team notified?  Yes  - Family Notified? Yes, Daughter Called  - Additional notes/ transfer status: EKG, Chest Xray. CT-Head Michelle Ill, MD  12/19/2016, 1:33 PM

## 2016-12-19 NOTE — Anesthesia Procedure Notes (Signed)
Procedure Name: Intubation Date/Time: 12/19/2016 1:21 PM Performed by: Merrilyn Puma B Pre-anesthesia Checklist: Patient identified, Emergency Drugs available, Suction available, Patient being monitored and Timeout performed Patient Re-evaluated:Patient Re-evaluated prior to induction Oxygen Delivery Method: Ambu bag Preoxygenation: Pre-oxygenation with 100% oxygen Induction Type: IV induction Laryngoscope Size: McGraph Grade View: Grade I Tube type: Subglottic suction tube Tube size: 7.5 mm Number of attempts: 1 Airway Equipment and Method: Stylet Placement Confirmation: ETT inserted through vocal cords under direct vision,  positive ETCO2,  CO2 detector and breath sounds checked- equal and bilateral Secured at: 21 cm Tube secured with: Tape Dental Injury: Teeth and Oropharynx as per pre-operative assessment

## 2016-12-19 NOTE — Progress Notes (Signed)
Patient with ongoing delirium this evening and refusing most medications and treatments and interfering with care requiring bilateral wrist restraints. Will pass along in report.   Michelle [name]

## 2016-12-19 NOTE — Progress Notes (Signed)
RT note- Called to room for code blue, Patient had fallen on floor, now in the bed, assist  ventilation with ambu bag. Patient is awake but confused, anesthesia  At bedside to intubate. Placed on previous settings and then taken to CT.

## 2016-12-19 NOTE — Procedures (Signed)
Extubation Procedure Note  Patient Details:   Name: Michelle [name] DOB: 06-Nov-1960 MRN: [id number]   Airway Documentation:     Evaluation  O2 sats: stable throughout Complications: No apparent complications Patient did tolerate procedure well. Bilateral Breath Sounds: Clear, Diminished   Yes  Patient extubated to [facility] per MD order, positive cuff leak noted, no evidence of stridor noted, vitals stable, RT will continue to monitor.  Jolyn Deshmukh N Rielly Corlett 12/19/2016, 3:07 PM

## 2016-12-19 NOTE — Progress Notes (Signed)
Patient ID: Michelle [name], female   DOB: 09/30/1960, 56 y.o.   MRN: [id number]  Received psychiatric consultation for medication management of psychiatric medication as patient has a status post CABG. Patient had emergency medical condition this afternoon required intubation and unable to complete the psychiatric consultation today. Patient will be followed up later or may contact psychiatric consultation again when patient is extubated and able to communicate well.  Tasha Jindra 12/19/2016 2:26 PM

## 2016-12-19 NOTE — Progress Notes (Signed)
2 Days Post-Op Procedure(s) (LRB): CORONARY ARTERY BYPASS GRAFTING (CABG) x 4, using left internal mammary artery and right greater saphenous vein harvested endoscopically (N/A) INTRAOPERATIVE TRANSESOPHAGEAL ECHOCARDIOGRAM (N/A) Subjective:  Has had continued delirium overnight with confusion, refusing meds. Restrained for safety because she was pulling at oxygen and lines.  Objective: Vital signs in last 24 hours: Temp:  [98.5 F (36.9 C)-100 F (37.8 C)] 98.5 F (36.9 C) (07/24 1942) Pulse Rate:  [88-114] 106 (07/25 0700) Cardiac Rhythm: Sinus tachycardia (07/25 0000) Resp:  [16-36] 32 (07/25 0700) BP: (50-109)/(32-74) 106/68 (07/25 0600) SpO2:  [88 %-100 %] 95 % (07/25 0700) Arterial Line BP: (84-131)/(47-78) 119/70 (07/25 0700) FiO2 (%):  [50 %-60 %] 50 % (07/24 0838)  Hemodynamic parameters for last 24 hours: PAP: (21-26)/(12-16) 25/15 CO:  [3.3 L/min] 3.3 L/min CI:  [1.9 L/min/m2] 1.9 L/min/m2  Intake/Output from previous day: 07/24 0701 - 07/25 0700 In: 2172.8 [P.O.:240; I.V.:1632.8; IV Piggyback:300] Out: 5361 [Urine:1550; Chest Tube:20] Intake/Output this shift: No intake/output data recorded.  General appearance: delirious Neurologic: nonfocal Heart: regular rate and rhythm, S1, S2 normal, no murmur, click, rub or gallop Lungs: clear to auscultation bilaterally Abdomen: soft, non-tender; bowel sounds normal; no masses,  no organomegaly Extremities: edema moderate Wound: dressings dry  Lab Results:  Recent Labs  12/18/16 1734 12/19/16 0325  WBC 17.7* 16.1*  HGB 8.2* 7.9*  HCT 25.7* 25.0*  PLT 233 208   BMET:  Recent Labs  12/18/16 0244 12/18/16 1723 12/18/16 1734 12/19/16 0325  NA 138 138  --  136  K 3.7 3.7  --  3.7  CL 108 102  --  107  CO2 23  --   --  21*  GLUCOSE 110* 120*  --  125*  BUN 10 12  --  11  CREATININE 0.71 0.50 0.79 0.64  CALCIUM 8.2*  --   --  8.6*    PT/INR:  Recent Labs  12/17/16 1330  LABPROT 16.4*  INR 1.32    ABG    Component Value Date/Time   PHART 7.384 12/18/2016 1013   HCO3 18.7 (L) 12/18/2016 1013   TCO2 19 12/18/2016 1723   ACIDBASEDEF 5.0 (H) 12/18/2016 1013   O2SAT 91.0 12/18/2016 1013   CBG (last 3)   Recent Labs  12/18/16 1941 12/18/16 2341 12/19/16 0318  GLUCAP 123* 100* 115*   CXR: bibasilar atelectasis  Assessment/Plan: S/P Procedure(s) (LRB): CORONARY ARTERY BYPASS GRAFTING (CABG) x 4, using left internal mammary artery and right greater saphenous vein harvested endoscopically (N/A) INTRAOPERATIVE TRANSESOPHAGEAL ECHOCARDIOGRAM (N/A)   POD 2  She is hemodynamically stable and neo weaned off. Wean off dopamine 2 mcg today. Hold off on beta blocker.  Postop delirium: started yesterday after being fine in the morning. She is on high dose Seroquel, Effexor, Paxil, Neurontin and is followed by a psychiatrist for anxiety and depression, non-compliance with correct doses of meds, overdose etc. Review of her meds since admission it looks like all but Paxil were discontinued. Probably worthwhile having psychiatry see her here to try to decide what meds she should be on postop. She has been refusing everything at this time.  Acute postop blood loss anemia: observe  Volume excess: diurese.  DC arterial line.     LOS: 7 days    Gaye Pollack 12/19/2016

## 2016-12-20 DIAGNOSIS — F314 Bipolar disorder, current episode depressed, severe, without psychotic features: Secondary | ICD-10-CM | POA: Diagnosis present

## 2016-12-20 LAB — BPAM RBC
BLOOD PRODUCT EXPIRATION DATE: 201808072359
Blood Product Expiration Date: 201808072359
Blood Product Expiration Date: 201808072359
Blood Product Expiration Date: 201808072359
ISSUE DATE / TIME: 201807230953
ISSUE DATE / TIME: 201807230953
ISSUE DATE / TIME: 201807251542
UNIT TYPE AND RH: 6200
UNIT TYPE AND RH: 6200
Unit Type and Rh: 6200
Unit Type and Rh: 6200

## 2016-12-20 LAB — GLUCOSE, CAPILLARY
GLUCOSE-CAPILLARY: 102 mg/dL — AB (ref 65–99)
GLUCOSE-CAPILLARY: 123 mg/dL — AB (ref 65–99)
Glucose-Capillary: 99 mg/dL (ref 65–99)
Glucose-Capillary: 91 mg/dL (ref 65–99)
Glucose-Capillary: 97 mg/dL (ref 65–99)

## 2016-12-20 LAB — TYPE AND SCREEN
ABO/RH(D): A POS
Antibody Screen: NEGATIVE
UNIT DIVISION: 0
UNIT DIVISION: 0
Unit division: 0
Unit division: 0

## 2016-12-20 LAB — CBC
HEMATOCRIT: 26.2 % — AB (ref 36.0–46.0)
HEMOGLOBIN: 8.5 g/dL — AB (ref 12.0–15.0)
MCH: 29.4 pg (ref 26.0–34.0)
MCHC: 32.4 g/dL (ref 30.0–36.0)
MCV: 90.7 fL (ref 78.0–100.0)
Platelets: 198 10*3/uL (ref 150–400)
RBC: 2.89 MIL/uL — ABNORMAL LOW (ref 3.87–5.11)
RDW: 16.1 % — AB (ref 11.5–15.5)
WBC: 13.5 10*3/uL — ABNORMAL HIGH (ref 4.0–10.5)

## 2016-12-20 LAB — POCT I-STAT 3, ART BLOOD GAS (G3+)
BICARBONATE: 23.3 mmol/L (ref 20.0–28.0)
O2 SAT: 99 %
PCO2 ART: 31.8 mmHg — AB (ref 32.0–48.0)
PO2 ART: 121 mmHg — AB (ref 83.0–108.0)
TCO2: 24 mmol/L (ref 0–100)
pH, Arterial: 7.473 — ABNORMAL HIGH (ref 7.350–7.450)

## 2016-12-20 LAB — BASIC METABOLIC PANEL
ANION GAP: 10 (ref 5–15)
BUN: 13 mg/dL (ref 6–20)
CHLORIDE: 109 mmol/L (ref 101–111)
CO2: 21 mmol/L — AB (ref 22–32)
CREATININE: 0.77 mg/dL (ref 0.44–1.00)
Calcium: 8.5 mg/dL — ABNORMAL LOW (ref 8.9–10.3)
GFR calc Af Amer: >60 mL/min (ref >60)
GFR calc non Af Amer: >60 mL/min (ref >60)
GLUCOSE: 100 mg/dL — AB (ref 65–99)
Potassium: 3 mmol/L — ABNORMAL LOW (ref 3.5–5.1)
Sodium: 140 mmol/L (ref 135–145)

## 2016-12-20 MED ORDER — METOPROLOL TARTRATE 12.5 MG HALF TABLET
12.5000 mg | ORAL_TABLET | Freq: Two times a day (BID) | ORAL | Status: DC
  Administered 2016-12-20 – 2016-12-21 (×3): 12.5 mg via ORAL
  Filled 2016-12-20 (×3): qty 1

## 2016-12-20 MED ORDER — POTASSIUM CHLORIDE CRYS ER 20 MEQ PO TBCR
40.0000 meq | EXTENDED_RELEASE_TABLET | Freq: Two times a day (BID) | ORAL | Status: AC
  Administered 2016-12-20 – 2016-12-23 (×6): 40 meq via ORAL
  Filled 2016-12-20 (×6): qty 2

## 2016-12-20 MED ORDER — POTASSIUM CHLORIDE CRYS ER 20 MEQ PO TBCR
20.0000 meq | EXTENDED_RELEASE_TABLET | ORAL | Status: DC | PRN
  Administered 2016-12-20 (×2): 20 meq via ORAL
  Filled 2016-12-20 (×3): qty 1

## 2016-12-20 MED ORDER — VENLAFAXINE HCL ER 75 MG PO CP24
225.0000 mg | ORAL_CAPSULE | Freq: Every day | ORAL | Status: DC
  Administered 2016-12-21: 225 mg via ORAL
  Filled 2016-12-20: qty 1

## 2016-12-20 MED ORDER — QUETIAPINE FUMARATE 100 MG PO TABS
200.0000 mg | ORAL_TABLET | Freq: Two times a day (BID) | ORAL | Status: DC
  Administered 2016-12-20 – 2016-12-21 (×2): 200 mg via ORAL
  Filled 2016-12-20 (×2): qty 2

## 2016-12-20 MED FILL — Medication: Qty: 1 | Status: AC

## 2016-12-20 NOTE — Consult Note (Signed)
[facility] Consult   Reason for Consult:  Medication management Referring Physician:  Dr. Cyndia Bent Patient Identification: Michelle [name] MRN:  [id number] Principal Diagnosis: Severe bipolar I disorder, current or most recent episode depressed [facility]) Diagnosis:   Patient Active Problem List   Diagnosis Date Noted  . 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 Tobacco abuse [Z72.0] 12/13/2016  . Angina pectoris ([place]) [I20.9]   . Constipation [K59.00] 12/05/2016  . Bilateral leg edema [R60.0] 12/05/2016  . Estrogen deficiency [E28.39] 12/05/2016  . BCC (basal cell carcinoma of skin) [C44.91] 07/17/2016  . Benzodiazepine overdose [T42.4X1A] 12/03/2011  . Genital herpes [054.1] 01/03/2011  . Leg weakness, bilateral [R29.898] 01/02/2011  . Vaginal lesion [N89.8] 01/02/2011  . Leukocytosis [D72.829] 01/02/2011  . Major depression [F32.9] 11/16/2010  . Nausea & vomiting [R11.2] 11/16/2010  . Abdominal tenderness [789.6] 11/16/2010  . Right flank tenderness [R10.819] 11/16/2010  . Preventative health care [Z00.00] 10/24/2010  . HTN (hypertension) [I10] 08/30/2010  . VITAMIN B12 DEFICIENCY [E53.8] 07/26/2009  . ANEMIA-B12 DEFICIENCY [D51.8] 07/11/2009  . GERD [K21.9] 07/11/2009  . ANEMIA-IRON DEFICIENCY [D50.9] 06/21/2009  . DISC DISEASE, CERVICAL [M50.30] 05/03/2009  . BACK PAIN [M54.9] 07/01/2007  . OSTEOARTHRITIS, CERVICAL SPINE [M47.9] 03/12/2007  . Hyperlipidemia, unspecified [E78.5] 02/06/2007  . Anxiety state [F41.1] 02/06/2007  . Depression, prolonged [F32.9] 02/06/2007  . MIGRAINE HEADACHE [G43.909] 02/06/2007  . OSTEOARTHRITIS [M19.90] 02/06/2007  . LOW BACK PAIN [M54.5] 02/06/2007  . Insomnia, unspecified [G47.00] 02/06/2007    Total Time spent with patient: 1 hour  Subjective:   Michelle [name] is a 56 y.o. female patient admitted with Bipolar disorder.  HPI: Michelle [name] is a 56 y.o. female, seen, chart reviewed and case discussed with the patient,  patient 2 daughters in at bedside and staff RN for this face-to-face psychiatric consultation and evaluation. Patient has been sitting in a chair next to bed, cooperative and has significant cognitive deficits and anxiety and left leg cramp. Patient has no current symptoms of mood swings, irritability, agitation or aggressive behavior. Patient reportedly relocated from [place], [place] to [place] about a year ago, he was seen mental health provider Dr. Rosaland Lao, M.D. at [facility] and now she has been established patient of Dr. Verta Ellen. Patient has been treated with multiple psychotropic medications including Seroquel, Paxil, Effexor and gabapentin. Patient did not do well tolerated reducing her Paxil from 40 mg to 30 mg by her outpatient psychiatrist. Reportedly patient has a history of suicidal attempt by benzodiazepine overdose in the past. Patient was admitted for elevated BNP and edema and status post CABG. Patient fell down while trying to walk to the bathroom yesterday. Patient reported she does not have the strength to hold on but denied any head injury. Patient to CT scan of the head was negative. Review of labs indicated patient has hypokalemia and patient daughter reported she has been suffering with chronic hypokalemia required potassium supplementation most of the time which is causing leg cramps and weakness. Patient has no suicidal or homicidal ideation.  Past medical history: Patient  with PMH of HTN (not on any medication currently), CVA, seizure, cerebral aneurysm and HLD (not on any medications) and ongoing tobacco abuse   Past Psychiatric History: Patient has been diagnosed with severe bipolar disorder, depressed moods, history of suicidal attempt with benzodiazepine overdose. Patient also reportedly have auditory/visual hallucinations about her deceased husband who passed away 6 years ago. Patient has brain aneurysm rupture and subarachnoid hemorrhage about 3 years ago  Risk to Self: Is  patient at risk for suicide?: No Risk to Others:   Prior Inpatient Therapy:   Prior Outpatient Therapy:    Past Medical History:  Past Medical History:  Diagnosis Date  . ABDOMINAL PAIN RIGHT LOWER QUADRANT 07/26/2009  . Abdominal pain, left lower quadrant 07/26/2009  . ABDOMINAL PAIN, LOWER 11/29/2009  . ANEMIA-B12 DEFICIENCY 07/11/2009  . ANEMIA-IRON DEFICIENCY 06/21/2009  . ANXIETY 02/06/2007  . Arthritis    "back" (12/12/2016)  . BACK PAIN 07/01/2007  . Benzodiazepine overdose 12/03/2011   July 2013  . CAD (coronary artery disease)    LHC 12/12/16: Multivessel CAD  . CHEST PAIN 06/21/2009  . DEPRESSION 02/06/2007  . [place]i DISEASE, CERVICAL 05/03/2009  . DYSPEPSIA 10/23/2007  . EAR PAIN, RIGHT 11/29/2009  . ELBOW PAIN, RIGHT 06/27/2010  . ELEVATED BLOOD PRESSURE WITHOUT DIAGNOSIS OF HYPERTENSION 07/01/2007  . FEVER UNSPECIFIED 06/03/2008  . FREQUENCY, URINARY 03/12/2007  . Genital herpes 01/03/2011  . GERD 07/11/2009  . Glossitis 10/23/2007  . HEAD TRAUMA, CLOSED 11/29/2009  . History of surgery for cerebral aneurysm 2015  . HYPERLIPIDEMIA 02/06/2007  . Insomnia, unspecified 02/06/2007  . LATERAL EPICONDYLITIS, LEFT 06/03/2008  . LOW BACK PAIN 02/06/2007  . Major depression 11/16/2010  . MIGRAINE HEADACHE 02/06/2007   "used to get them all the time; stopped a few years ago" (12/12/2016)  . OSTEOARTHRITIS 02/06/2007  . 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 OTITIS EXTERNA, RIGHT 10/04/2009  . Stroke [facility]) 2016-2017 X 3   RLE "goes to the side a little bit when I walk" (12/12/2016)  . VITAMIN B12 DEFICIENCY 07/26/2009    Past Surgical History:  Procedure Laterality Date  . ABDOMINAL HYSTERECTOMY  2005   "still have my ovaries"  . ANKLE FRACTURE SURGERY Right ?1991  . ANTERIOR CERVICAL DECOMP/DISCECTOMY FUSION  2008   C5-6  . APPENDECTOMY  04/2001   lap appy/notes 10/11/2010  . BACK SURGERY    . CARDIAC CATHETERIZATION  12/12/2016  . CEREBRAL ANEURYSM REPAIR    . CORONARY ARTERY BYPASS GRAFT N/A  12/17/2016   Procedure: CORONARY ARTERY BYPASS GRAFTING (CABG) x 4, using left internal mammary artery and right greater saphenous vein harvested endoscopically;  Surgeon: Gaye Pollack, MD;  Location: [facility] OR;  Service: Open Heart Surgery;  Laterality: N/A;  . CYSTOSCOPY W/ URETEROSCOPY  02/17/2002    Cystoscopy, right retrograde pyelogram with interpretation, left ureteroscopy with holmium and right ureteroscopy/notes 10/11/2010  . INTRAOPERATIVE TRANSESOPHAGEAL ECHOCARDIOGRAM N/A 12/17/2016   Procedure: INTRAOPERATIVE TRANSESOPHAGEAL ECHOCARDIOGRAM;  Surgeon: Gaye Pollack, MD;  Location: [facility] OR;  Service: Open Heart Surgery;  Laterality: N/A;  . LEFT HEART CATH AND CORONARY ANGIOGRAPHY N/A 12/12/2016   Procedure: Left Heart Cath and Coronary Angiography;  Surgeon: Jettie Booze, MD;  Location: [facility];  Service: Cardiovascular;  Laterality: N/A;  . TUBAL LIGATION     Family History:  Family History  Problem Relation Age of Onset  . Diabetes Other   . Hypertension Other   . Coronary artery disease Other 38       Female 1st degree relative  . Cancer - Lung Father   . Cirrhosis Brother   . Diabetes Brother    Family Psychiatric  History: Unknown  Social History:  History  Alcohol Use  . Yes    Comment: 12/12/2016 "2 drinks per month"     History  Drug Use No    Social History   Social History  . Marital status: Widowed  Spouse name: N/A  . Number of children: N/A  . Years of education: N/A   Occupational History  . 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 Smoking status: Former Smoker    Packs/day: 1.50    Years: 42.00    Types: Cigarettes    Quit date: 11/14/2016  . Smokeless tobacco: Never Used  . Alcohol use Yes     Comment: 12/12/2016 "2 drinks per month"  . Drug use: No  . Sexual activity: No   Other Topics Concern  . None   Social History Narrative   Currently living apart from  husband   Additional Social History:    Allergies:   Allergies  Allergen Reactions  . Citalopram Other (See Comments)    "Feels wierd"  . Diclofenac Sodium Nausea Only    GI Upset  . Fluoxetine Hcl Nausea Only  . Ibuprofen Nausea Only    Labs:  Results for orders placed or performed during the hospital encounter of 12/12/16 (from the past 48 hour(s))  Glucose, capillary     Status: Abnormal   Collection Time: 12/18/16  4:41 PM  Result Value Ref Range   Glucose-Capillary 105 (H) 65 - 99 mg/dL  I-STAT, chem 8     Status: Abnormal   Collection Time: 12/18/16  5:23 PM  Result Value Ref Range   Sodium 138 135 - 145 mmol/L   Potassium 3.7 3.5 - 5.1 mmol/L   Chloride 102 101 - 111 mmol/L   BUN 12 6 - 20 mg/dL   Creatinine, Ser 0.50 0.44 - 1.00 mg/dL   Glucose, Bld 120 (H) 65 - 99 mg/dL   Calcium, Ion 1.11 (L) 1.15 - 1.40 mmol/L   TCO2 19 0 - 100 mmol/L   Hemoglobin 8.8 (L) 12.0 - 15.0 g/dL   HCT 26.0 (L) 36.0 - 46.0 %  Magnesium     Status: None   Collection Time: 12/18/16  5:34 PM  Result Value Ref Range   Magnesium 2.0 1.7 - 2.4 mg/dL  CBC     Status: Abnormal   Collection Time: 12/18/16  5:34 PM  Result Value Ref Range   WBC 17.7 (H) 4.0 - 10.5 K/uL   RBC 2.88 (L) 3.87 - 5.11 MIL/uL   Hemoglobin 8.2 (L) 12.0 - 15.0 g/dL   HCT 25.7 (L) 36.0 - 46.0 %   MCV 89.2 78.0 - 100.0 fL   MCH 28.5 26.0 - 34.0 pg   MCHC 31.9 30.0 - 36.0 g/dL   RDW 15.4 11.5 - 15.5 %   Platelets 233 150 - 400 K/uL  Creatinine, serum     Status: None   Collection Time: 12/18/16  5:34 PM  Result Value Ref Range   Creatinine, Ser 0.79 0.44 - 1.00 mg/dL   GFR calc non Af Amer >60 >60 mL/min   GFR calc Af Amer >60 >60 mL/min    Comment: (NOTE) The eGFR has been calculated using the CKD EPI equation. This calculation has not been validated in all clinical situations. eGFR's persistently <60 mL/min signify possible Chronic Kidney Disease.   Glucose, capillary     Status: Abnormal   Collection  Time: 12/18/16  7:41 PM  Result Value Ref Range   Glucose-Capillary 123 (H) 65 - 99 mg/dL   Comment 1 Notify RN   Glucose, capillary     Status: Abnormal   Collection Time: 12/18/16 11:41 PM  Result Value Ref Range   Glucose-Capillary 100 (  H) 65 - 99 mg/dL   Comment 1 Arterial Specimen   Glucose, capillary     Status: Abnormal   Collection Time: 12/19/16  3:18 AM  Result Value Ref Range   Glucose-Capillary 115 (H) 65 - 99 mg/dL   Comment 1 Arterial Specimen   Basic metabolic panel     Status: Abnormal   Collection Time: 12/19/16  3:25 AM  Result Value Ref Range   Sodium 136 135 - 145 mmol/L   Potassium 3.7 3.5 - 5.1 mmol/L   Chloride 107 101 - 111 mmol/L   CO2 21 (L) 22 - 32 mmol/L   Glucose, Bld 125 (H) 65 - 99 mg/dL   BUN 11 6 - 20 mg/dL   Creatinine, Ser 0.64 0.44 - 1.00 mg/dL   Calcium 8.6 (L) 8.9 - 10.3 mg/dL   GFR calc non Af Amer >60 >60 mL/min   GFR calc Af Amer >60 >60 mL/min    Comment: (NOTE) The eGFR has been calculated using the CKD EPI equation. This calculation has not been validated in all clinical situations. eGFR's persistently <60 mL/min signify possible Chronic Kidney Disease.    Anion gap 8 5 - 15  CBC     Status: Abnormal   Collection Time: 12/19/16  3:25 AM  Result Value Ref Range   WBC 16.1 (H) 4.0 - 10.5 K/uL   RBC 2.80 (L) 3.87 - 5.11 MIL/uL   Hemoglobin 7.9 (L) 12.0 - 15.0 g/dL   HCT 25.0 (L) 36.0 - 46.0 %   MCV 89.3 78.0 - 100.0 fL   MCH 28.2 26.0 - 34.0 pg   MCHC 31.6 30.0 - 36.0 g/dL   RDW 15.3 11.5 - 15.5 %   Platelets 208 150 - 400 K/uL  Glucose, capillary     Status: Abnormal   Collection Time: 12/19/16 11:45 AM  Result Value Ref Range   Glucose-Capillary 115 (H) 65 - 99 mg/dL   Comment 1 Notify RN   Glucose, capillary     Status: Abnormal   Collection Time: 12/19/16  4:50 PM  Result Value Ref Range   Glucose-Capillary 113 (H) 65 - 99 mg/dL   Comment 1 Notify RN   Glucose, capillary     Status: Abnormal   Collection Time:  12/19/16  7:39 PM  Result Value Ref Range   Glucose-Capillary 113 (H) 65 - 99 mg/dL   Comment 1 Notify RN   Glucose, capillary     Status: Abnormal   Collection Time: 12/19/16 11:09 PM  Result Value Ref Range   Glucose-Capillary 117 (H) 65 - 99 mg/dL   Comment 1 Notify RN   Glucose, capillary     Status: Abnormal   Collection Time: 12/20/16  3:38 AM  Result Value Ref Range   Glucose-Capillary 102 (H) 65 - 99 mg/dL   Comment 1 Notify RN   CBC     Status: Abnormal   Collection Time: 12/20/16  4:53 AM  Result Value Ref Range   WBC 13.5 (H) 4.0 - 10.5 K/uL   RBC 2.89 (L) 3.87 - 5.11 MIL/uL   Hemoglobin 8.5 (L) 12.0 - 15.0 g/dL   HCT 26.2 (L) 36.0 - 46.0 %   MCV 90.7 78.0 - 100.0 fL   MCH 29.4 26.0 - 34.0 pg   MCHC 32.4 30.0 - 36.0 g/dL   RDW 16.1 (H) 11.5 - 15.5 %   Platelets 198 150 - 400 K/uL  Basic metabolic panel     Status: Abnormal  Collection Time: 12/20/16  4:53 AM  Result Value Ref Range   Sodium 140 135 - 145 mmol/L   Potassium 3.0 (L) 3.5 - 5.1 mmol/L    Comment: DELTA CHECK NOTED   Chloride 109 101 - 111 mmol/L   CO2 21 (L) 22 - 32 mmol/L   Glucose, Bld 100 (H) 65 - 99 mg/dL   BUN 13 6 - 20 mg/dL   Creatinine, Ser 0.77 0.44 - 1.00 mg/dL   Calcium 8.5 (L) 8.9 - 10.3 mg/dL   GFR calc non Af Amer >60 >60 mL/min   GFR calc Af Amer >60 >60 mL/min    Comment: (NOTE) The eGFR has been calculated using the CKD EPI equation. This calculation has not been validated in all clinical situations. eGFR's persistently <60 mL/min signify possible Chronic Kidney Disease.    Anion gap 10 5 - 15  Glucose, capillary     Status: None   Collection Time: 12/20/16  7:45 AM  Result Value Ref Range   Glucose-Capillary 99 65 - 99 mg/dL   Comment 1 Notify RN   Glucose, capillary     Status: None   Collection Time: 12/20/16 12:34 PM  Result Value Ref Range   Glucose-Capillary 91 65 - 99 mg/dL   Comment 1 Notify RN     Current Facility-Administered Medications  Medication Dose  Route Frequency Provider Last Rate Last Dose  . 0.9 %  sodium chloride infusion  250 mL Intravenous Continuous Conte, Tessa N, PA-C      . 0.9 %  sodium chloride infusion   Intravenous Continuous Conte, Tessa N, PA-C   Stopped at 12/18/16 1200  . acetaminophen (TYLENOL) tablet 1,000 mg  1,000 mg Oral Q6H Conte, Tessa N, PA-C   1,000 mg at 12/20/16 7622   Or  . acetaminophen (TYLENOL) solution 1,000 mg  1,000 mg Per Tube Q6H Conte, Tessa N, PA-C   1,000 mg at 12/18/16 0501  . aspirin EC tablet 325 mg  325 mg Oral Daily Elgie Collard, [place]   325 mg at 12/20/16 6333   Or  . aspirin chewable tablet 324 mg  324 mg Per Tube Daily Conte, Tessa N, PA-C      . atorvastatin (LIPITOR) tablet 40 mg  40 mg Oral QHS Elgie Collard, PA-C   40 mg at 12/19/16 2207  . Chlorhexidine Gluconate Cloth 2 % PADS 6 each  6 each Topical Daily Gaye Pollack, MD   6 each at 12/20/16 1000  . enoxaparin (LOVENOX) injection 40 mg  40 mg Subcutaneous QHS Gaye Pollack, MD      . gabapentin (NEURONTIN) capsule 300 mg  300 mg Oral TID Harriet Pho, Tessa N, PA-C   300 mg at 12/20/16 5456  . levalbuterol (XOPENEX) nebulizer solution 1.25 mg  1.25 mg Nebulization Q6H PRN Gaye Pollack, MD      . MEDLINE mouth rinse  15 mL Mouth Rinse BID Gaye Pollack, MD      . metoprolol tartrate (LOPRESSOR) tablet 12.5 mg  12.5 mg Oral BID Gaye Pollack, MD   12.5 mg at 12/20/16 0909  . mupirocin ointment (BACTROBAN) 2 % 1 application  1 application Nasal BID Gaye Pollack, MD   1 application at 25/63/89 [phone]  . ondansetron (ZOFRAN) injection 4 mg  4 mg Intravenous Q6H PRN Harriet Pho, Tessa N, PA-C      . oxyCODONE (Oxy IR/ROXICODONE) immediate release tablet 5-10 mg  5-10 mg Oral Q3H PRN Harriet Pho,  Tessa N, PA-C      . pantoprazole (PROTONIX) EC tablet 40 mg  40 mg Oral Daily Elgie Collard, PA-C   40 mg at 12/20/16 6644  . PARoxetine (PAXIL) tablet 40 mg  40 mg Oral Daily Elgie Collard, PA-C   40 mg at 12/20/16 0347  . potassium chloride SA  (K-DUR,KLOR-CON) CR tablet 20 mEq  20 mEq Oral Q4H PRN Gaye Pollack, MD   20 mEq at 12/20/16 1359  . QUEtiapine (SEROQUEL) tablet 200 mg  200 mg Oral TID Harriet Pho, Tessa N, PA-C   200 mg at 12/20/16 4259  . sodium chloride flush (NS) 0.9 % injection 3 mL  3 mL Intravenous Q12H Conte, Tessa N, PA-C   3 mL at 12/20/16 1012  . sodium chloride flush (NS) 0.9 % injection 3 mL  3 mL Intravenous PRN Conte, Tessa N, PA-C      . traMADol Veatrice Bourbon) tablet 50-100 mg  50-100 mg Oral Q4H PRN Conte, Tessa N, PA-C      . venlafaxine XR (EFFEXOR-XR) 24 hr capsule 300 mg  300 mg Oral Daily Conte, Tessa N, PA-C   300 mg at 12/20/16 5638   Facility-Administered Medications Ordered in Other Encounters  Medication Dose Route Frequency Provider Last Rate Last Dose  . etomidate (AMIDATE) injection    Anesthesia Intra-op Wilburn Cornelia, CRNA   12 mg at 12/19/16 1323  . succinylcholine (ANECTINE) injection    Anesthesia Intra-op Wilburn Cornelia, CRNA   100 mg at 12/19/16 1323    Musculoskeletal: Strength & Muscle Tone: decreased Gait & Station: unsteady Patient leans: N/A  Psychiatric Specialty Exam: Physical Exam as per history and physical   ROS status post CABG generalized weakness, memory deficits, lower extremity weakness, left leg cramps, hypokalemia and history of bipolar depression. Patient denied nausea, vomiting, abdomen pain, shortness of breath and chest pain. No Fever-chills, No Headache, No changes with Vision or hearing, reports vertigo No problems swallowing food or Liquids, No Chest pain, Cough or Shortness of Breath, No Abdominal pain, No Nausea or Vommitting, Bowel movements are regular, No Blood in stool or Urine, No dysuria, No new skin rashes or bruises, No new joints pains-aches,  No new weakness, tingling, numbness in any extremity, No recent weight gain or loss, No polyuria, polydypsia or polyphagia,  A full 10 point Review of Systems was done, except as stated above, all other  Review of Systems were negative.   Blood pressure 113/65, pulse (!) 110, temperature 98.3 F (36.8 C), temperature source Oral, resp. rate (!) 26, height _0  (1.575 m), weight 72.4 kg (159 lb 9.8 oz), SpO2 94 %.Body mass index is 29.19 kg/m.  General Appearance: Guarded  Eye Contact:  Good  Speech:  Clear and Coherent and Slow  Volume:  Decreased  Mood:  Depressed  Affect:  Constricted and Depressed  Thought Process:  Coherent and Goal Directed  Orientation:  Full (Time, Place, and Person)  Thought Content:  Logical  Suicidal Thoughts:  No  Homicidal Thoughts:  No  Memory:  Immediate;   Fair Recent;   Poor Remote;   Poor  Judgement:  Impaired  Insight:  Fair  Psychomotor Activity:  Psychomotor Retardation and Restlessness  Concentration:  Concentration: Fair and Attention Span: Poor  Recall:  Poor  Fund of Knowledge:  Fair  Language:  Fair  Akathisia:  Negative  Handed:  Right  AIMS (if indicated):     Assets:  Communication Skills Desire for Improvement  Financial Resources/Insurance Housing Leisure Time Resilience Social Support Transportation  ADL's:  Impaired  Cognition:  Impaired,  Mild  Sleep:        Treatment Plan Summary: 56 years old female with history of bipolar depression, generalized anxiety and history of suicidal attempt admitted for elevated BNP and edema. Patient has status post CABGx4, 2 days ago. She has a preformed) to walk to the bathroom yesterday but no known head injury.  Bipolar disorder most recent episode is unspecified  Recommendation: Recommended potassium supplementation 40 mg twice daily for 3 days and check magnesium level in the morning for hypokalemia Will reduce Effexor XR to 225 mg daily starting tomorrow morning and may reduce to 150 mg after 8/29 if tolerated to avoid polypharmacy Will reduce Seroquel 200 mg 3 times daily instead of 3 times daily to reduce sedation Continue Paxil 40 mg daily as she did not tolerate reducing it  during last month Continue gabapentin 300 mg 3 times daily and may use as needed if required Monitor for the electrolytes as planned Daily contact with patient to assess and evaluate symptoms and progress in treatment and Medication management  Disposition: No evidence of imminent risk to self or others at present.   Supportive therapy provided about ongoing stressors.  Ambrose Finland, MD 12/20/2016 2:54 PM

## 2016-12-20 NOTE — Progress Notes (Signed)
3 Days Post-Op Procedure(s) (LRB): CORONARY ARTERY BYPASS GRAFTING (CABG) x 4, using left internal mammary artery and right greater saphenous vein harvested endoscopically (N/A) INTRAOPERATIVE TRANSESOPHAGEAL ECHOCARDIOGRAM (N/A) Subjective:  No chest pain or shortness of breath. Complains of frequent BM's  Objective: Vital signs in last 24 hours: Temp:  [97.5 F (36.4 C)-98.6 F (37 C)] 97.7 F (36.5 C) (07/26 0341) Pulse Rate:  [105-144] 109 (07/26 0700) Cardiac Rhythm: Sinus tachycardia (07/26 0000) Resp:  [15-36] 21 (07/26 0700) BP: (89-181)/(55-166) 101/65 (07/26 0700) SpO2:  [90 %-100 %] 93 % (07/26 0700) Arterial Line BP: (109-161)/(65-88) 161/88 (07/25 1100) FiO2 (%):  [100 %] 100 % (07/25 1332) Weight:  [72.4 kg (159 lb 9.8 oz)] 72.4 kg (159 lb 9.8 oz) (07/26 0500)  Hemodynamic parameters for last 24 hours:    Intake/Output from previous day: 07/25 0701 - 07/26 0700 In: 201.9 [P.O.:100; I.V.:51.9; IV Piggyback:50] Out: 3085 [Urine:3085] Intake/Output this shift: No intake/output data recorded.  General appearance: alert and cooperative Neurologic: intact Heart: regular rate and rhythm, S1, S2 normal, no murmur, click, rub or gallop Lungs: clear to auscultation bilaterally Abdomen: soft, non-tender; bowel sounds normal; no masses,  no organomegaly Extremities: extremities normal, atraumatic, no cyanosis or edema Wound: incisions ok  Lab Results:  Recent Labs  12/19/16 0325 12/20/16 0453  WBC 16.1* 13.5*  HGB 7.9* 8.5*  HCT 25.0* 26.2*  PLT 208 198   BMET:  Recent Labs  12/19/16 0325 12/20/16 0453  NA 136 140  K 3.7 3.0*  CL 107 109  CO2 21* 21*  GLUCOSE 125* 100*  BUN 11 13  CREATININE 0.64 0.77  CALCIUM 8.6* 8.5*    PT/INR:  Recent Labs  12/17/16 1330  LABPROT 16.4*  INR 1.32   ABG    Component Value Date/Time   PHART 7.384 12/18/2016 1013   HCO3 18.7 (L) 12/18/2016 1013   TCO2 19 12/18/2016 1723   ACIDBASEDEF 5.0 (H) 12/18/2016  1013   O2SAT 91.0 12/18/2016 1013   CBG (last 3)   Recent Labs  12/19/16 1939 12/19/16 2309 12/20/16 0338  GLUCAP 113* 117* 102*    Assessment/Plan: S/P Procedure(s) (LRB): CORONARY ARTERY BYPASS GRAFTING (CABG) x 4, using left internal mammary artery and right greater saphenous vein harvested endoscopically (N/A) INTRAOPERATIVE TRANSESOPHAGEAL ECHOCARDIOGRAM (N/A)  She is hemodynamically stable with resting sinus tachycardia 115. Will resume Lopressor which was held yesterday since she was on neo and dopamine in the morning.  Postop delirium: improved today. Continue psych meds  Stop stool softener and laxative.  Diuresed well yesterday and about at preop wt. DC foley  Hypokalemia: replete.  Ambulate, IS  LOS: 8 days    Gaye Pollack 12/20/2016

## 2016-12-20 NOTE — Progress Notes (Signed)
[facility] BRIEF SICU PROGRESS NOTE  3 Days Post-Op  S/P Procedure(s) (LRB): CORONARY ARTERY BYPASS GRAFTING (CABG) x 4, using left internal mammary artery and right greater saphenous vein harvested endoscopically (N/A) INTRAOPERATIVE TRANSESOPHAGEAL ECHOCARDIOGRAM (N/A)   Stable day  Plan: Continue current plan  Rexene Alberts, MD 12/20/2016 5:39 PM

## 2016-12-21 DIAGNOSIS — F314 Bipolar disorder, current episode depressed, severe, without psychotic features: Secondary | ICD-10-CM

## 2016-12-21 LAB — COMPREHENSIVE METABOLIC PANEL
ALK PHOS: 107 U/L (ref 38–126)
ALT: 36 U/L (ref 14–54)
ANION GAP: 9 (ref 5–15)
AST: 62 U/L — ABNORMAL HIGH (ref 15–41)
Albumin: 3.2 g/dL — ABNORMAL LOW (ref 3.5–5.0)
BUN: 14 mg/dL (ref 6–20)
CALCIUM: 8.9 mg/dL (ref 8.9–10.3)
CO2: 22 mmol/L (ref 22–32)
CREATININE: 0.71 mg/dL (ref 0.44–1.00)
Chloride: 110 mmol/L (ref 101–111)
Glucose, Bld: 98 mg/dL (ref 65–99)
Potassium: 4 mmol/L (ref 3.5–5.1)
SODIUM: 141 mmol/L (ref 135–145)
Total Bilirubin: 0.7 mg/dL (ref 0.3–1.2)
Total Protein: 5.8 g/dL — ABNORMAL LOW (ref 6.5–8.1)

## 2016-12-21 LAB — MAGNESIUM: MAGNESIUM: 1.9 mg/dL (ref 1.7–2.4)

## 2016-12-21 LAB — GLUCOSE, CAPILLARY: GLUCOSE-CAPILLARY: 99 mg/dL (ref 65–99)

## 2016-12-21 MED ORDER — SODIUM CHLORIDE 0.9 % IV SOLN: 250.0000 mL | INTRAVENOUS | Status: DC | PRN

## 2016-12-21 MED ORDER — ONDANSETRON HCL 4 MG PO TABS: 4.0000 mg | ORAL_TABLET | Freq: Four times a day (QID) | ORAL | Status: DC | PRN

## 2016-12-21 MED ORDER — MOVING RIGHT ALONG BOOK
Freq: Once | Status: AC
  Administered 2016-12-21: 18:00:00
  Filled 2016-12-21: qty 1

## 2016-12-21 MED ORDER — SODIUM CHLORIDE 0.9% FLUSH
3.0000 mL | Freq: Two times a day (BID) | INTRAVENOUS | Status: DC
  Administered 2016-12-21 – 2016-12-22 (×2): 3 mL via INTRAVENOUS

## 2016-12-21 MED ORDER — LAMOTRIGINE 25 MG PO TABS
25.0000 mg | ORAL_TABLET | Freq: Two times a day (BID) | ORAL | Status: DC
  Administered 2016-12-21 – 2016-12-23 (×5): 25 mg via ORAL
  Filled 2016-12-21 (×5): qty 1

## 2016-12-21 MED ORDER — ACETAMINOPHEN 325 MG PO TABS
650.0000 mg | ORAL_TABLET | Freq: Four times a day (QID) | ORAL | Status: DC | PRN
  Administered 2016-12-22: 650 mg via ORAL
  Filled 2016-12-21: qty 2

## 2016-12-21 MED ORDER — METOPROLOL TARTRATE 12.5 MG HALF TABLET
12.5000 mg | ORAL_TABLET | Freq: Two times a day (BID) | ORAL | Status: DC
  Administered 2016-12-21 – 2016-12-23 (×4): 12.5 mg via ORAL
  Filled 2016-12-21 (×4): qty 1

## 2016-12-21 MED ORDER — ONDANSETRON HCL 4 MG/2ML IJ SOLN: 4.0000 mg | Freq: Four times a day (QID) | INTRAMUSCULAR | Status: DC | PRN

## 2016-12-21 MED ORDER — QUETIAPINE FUMARATE ER 300 MG PO TB24: 300.0000 mg | ORAL_TABLET | Freq: Every day | ORAL | Status: DC

## 2016-12-21 MED ORDER — VENLAFAXINE HCL ER 75 MG PO CP24
150.0000 mg | ORAL_CAPSULE | Freq: Every day | ORAL | Status: DC
  Administered 2016-12-22 – 2016-12-23 (×2): 150 mg via ORAL
  Filled 2016-12-21 (×2): qty 2

## 2016-12-21 MED ORDER — SODIUM CHLORIDE 0.9% FLUSH: 3.0000 mL | INTRAVENOUS | Status: DC | PRN

## 2016-12-21 MED ORDER — ASPIRIN 325 MG PO TABS
ORAL_TABLET | ORAL | Status: AC
  Filled 2016-12-21: qty 1

## 2016-12-21 MED ORDER — ASPIRIN EC 325 MG PO TBEC
325.0000 mg | DELAYED_RELEASE_TABLET | Freq: Every day | ORAL | Status: DC
  Administered 2016-12-22 – 2016-12-23 (×2): 325 mg via ORAL
  Filled 2016-12-21 (×2): qty 1

## 2016-12-21 MED ORDER — ASPIRIN 81 MG PO CHEW
CHEWABLE_TABLET | ORAL | Status: AC
  Filled 2016-12-21: qty 1

## 2016-12-21 MED ORDER — TRAMADOL HCL 50 MG PO TABS
50.0000 mg | ORAL_TABLET | Freq: Four times a day (QID) | ORAL | Status: DC | PRN
  Administered 2016-12-23: 50 mg via ORAL
  Filled 2016-12-21: qty 1

## 2016-12-21 NOTE — Evaluation (Signed)
Physical Therapy Evaluation/Discharge  Patient Details Name: Michelle [name] MRN: [id number] DOB: December 25, 1960 Today's Date: 12/21/2016   History of Present Illness  56 yo admitted for CABGx 4 with PMHx: HTN, bipolar d/o, CVA, Sz, OA  Clinical Impression  Upon arrival pt A&O x 4 and agreeable to mobilize with PT. Pt states she lives alone in an apartment in the area and has been independent. Pt mobilizes well with no physical assist or cues required. Pt able to ambulate in hall with RW and also able to ambulate safety without use of RW at end of session. Pt demonstrated understanding of sternal precautions and given handout. Pt presents with deficits listed in PT problem list below; however pt Independent with bed mobility and supervision with ambulation. Pt to sign off and plan for cardiac rehab.   Vitals:  Pre ambulation HR 97, BP 103/69 During ambulation HR 117 Post ambulation BP 139/97    Follow Up Recommendations No PT follow up    Equipment Recommendations  None recommended by PT    Recommendations for Other Services       Precautions / Restrictions Precautions Precautions: Sternal;Fall      Mobility  Bed Mobility Overal bed mobility: Independent Bed Mobility: Rolling;Sidelying to Sit;Sit to Sidelying Rolling: Independent Sidelying to sit: Independent     Sit to sidelying: Independent General bed mobility comments: Pt independent with bed mobility and able to follow sternal precautions, using heart pillow   Transfers Overall transfer level: Independent               General transfer comment: Pt able to sit to stand without use of UE.   Ambulation/Gait Ambulation/Gait assistance: Supervision Ambulation Distance (Feet): 450 Feet Assistive device: Rolling walker (2 wheeled) Gait Pattern/deviations: Step-through pattern;Decreased stride length   Gait velocity interpretation: Below normal speed for age/gender General Gait Details: Pt with slower velocity of  gait and decreased stride length; however with stable balance. Pt was also able to ambulate ~75 ft. safely and comfortably without RW.   Stairs            Wheelchair Mobility    Modified Rankin (Stroke Patients Only)       Balance Overall balance assessment: No apparent balance deficits (not formally assessed)                                           Pertinent Vitals/Pain Pain Assessment: 0-10 Pain Score: 3  Pain Location: chest  Pain Descriptors / Indicators: Sore Pain Intervention(s): Limited activity within patient's tolerance;Monitored during session    Home Living Family/patient expects to be discharged to:: Private residence Living Arrangements: Alone Available Help at Discharge: Family;Available 24 hours/day Type of Home: Apartment Home Access: Level entry     Home Layout: One level Home Equipment: Walker - 2 wheels;Cane - single point      Prior Function Level of Independence: Independent               Hand Dominance        Extremity/Trunk Assessment   Upper Extremity Assessment Upper Extremity Assessment: Overall WFL for tasks assessed    Lower Extremity Assessment Lower Extremity Assessment: Overall WFL for tasks assessed    Cervical / Trunk Assessment Cervical / Trunk Assessment: Normal  Communication   Communication: No difficulties  Cognition Arousal/Alertness: Awake/alert Behavior During Therapy: WFL for tasks assessed/performed Overall Cognitive Status:  Within Functional Limits for tasks assessed                                        General Comments      Exercises     Assessment/Plan    PT Assessment Patent does not need any further PT services  PT Problem List Decreased safety awareness;Decreased cognition (Simultaneous filing. User may not have seen previous data.)       PT Treatment Interventions      PT Goals (Current goals can be found in the Care Plan section)  Acute  Rehab PT Goals Patient Stated Goal: go home  PT Goal Formulation: With patient Time For Goal Achievement: 01/04/17 Potential to Achieve Goals: Good    Frequency     Barriers to discharge        Co-evaluation               AM-PAC PT "6 Clicks" Daily Activity  Outcome Measure Difficulty turning over in bed (including adjusting bedclothes, sheets and blankets)?: None Difficulty moving from lying on back to sitting on the side of the bed? : None Difficulty sitting down on and standing up from a chair with arms (e.g., wheelchair, bedside commode, etc,.)?: None Help needed moving to and from a bed to chair (including a wheelchair)?: None Help needed walking in hospital room?: None Help needed climbing 3-5 steps with a railing? : A Little 6 Click Score: 23    End of Session Equipment Utilized During Treatment: Gait belt Activity Tolerance: Patient tolerated treatment well Patient left: in chair;with call bell/phone within reach;with family/visitor present;with nursing/sitter in room Nurse Communication: Mobility status PT Visit Diagnosis: Other abnormalities of gait and mobility (R26.89)    Time: 7591-6384 PT Time Calculation (min) (ACUTE ONLY): 21 min   Charges:   PT Evaluation $PT Eval Moderate Complexity: 1 Procedure     PT G CodesElberta Leatherwood, SPT Acute Rehab [place] 12/21/2016, 12:44 PM

## 2016-12-21 NOTE — Progress Notes (Addendum)
CARDIAC REHAB PHASE I   PRE:  Rate/Rhythm: 96 SR    BP: sitting 97/67    SaO2: 98 RA  MODE:  Ambulation: 350 ft   POST:  Rate/Rhythm: 118 ST    BP: sitting 113/76     SaO2: 97 RA  Pt able to stand and walk with supervision in room. Used RW in hall. Steady. SOB/fatigue with distance, HR elevated. To recliner. Pt has RW at home. Discussed with pt smoking cessation and CRPII. Will send referral to [facility]. Pt needs more IS work (750 mL, poor mechanics). Daughter present and attentive. Will f/u tomorrow.  [id number]  I returned and completed ed with pt and daughter. Voiced understanding although pt needs some directing at times.  [place], ACSM 12/21/2016 2:47 PM

## 2016-12-21 NOTE — Consult Note (Signed)
[place] [facility] Consult   Reason for Consult:  Medication management Referring Physician:  Dr. Cyndia Bent Patient Identification: Michelle [name] MRN:  [id number] Principal Diagnosis: Severe bipolar I disorder, current or most recent episode depressed [facility]) Diagnosis:   Patient Active Problem List   Diagnosis Date Noted  . Severe bipolar I disorder, current or most recent episode depressed ([place]) [F31.4] 12/20/2016  . 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 Tobacco abuse [Z72.0] 12/13/2016  . Angina pectoris ([place]) [I20.9]   . Constipation [K59.00] 12/05/2016  . Bilateral leg edema [R60.0] 12/05/2016  . Estrogen deficiency [E28.39] 12/05/2016  . BCC (basal cell carcinoma of skin) [C44.91] 07/17/2016  . Benzodiazepine overdose [T42.4X1A] 12/03/2011  . Genital herpes [054.1] 01/03/2011  . Leg weakness, bilateral [R29.898] 01/02/2011  . Vaginal lesion [N89.8] 01/02/2011  . Leukocytosis [D72.829] 01/02/2011  . Major depression [F32.9] 11/16/2010  . Nausea & vomiting [R11.2] 11/16/2010  . Abdominal tenderness [789.6] 11/16/2010  . Right flank tenderness [R10.819] 11/16/2010  . Preventative health care [Z00.00] 10/24/2010  . HTN (hypertension) [I10] 08/30/2010  . VITAMIN B12 DEFICIENCY [E53.8] 07/26/2009  . ANEMIA-B12 DEFICIENCY [D51.8] 07/11/2009  . GERD [K21.9] 07/11/2009  . ANEMIA-IRON DEFICIENCY [D50.9] 06/21/2009  . DISC DISEASE, CERVICAL [M50.30] 05/03/2009  . BACK PAIN [M54.9] 07/01/2007  . OSTEOARTHRITIS, CERVICAL SPINE [M47.9] 03/12/2007  . Hyperlipidemia, unspecified [E78.5] 02/06/2007  . Anxiety state [F41.1] 02/06/2007  . Depression, prolonged [F32.9] 02/06/2007  . MIGRAINE HEADACHE [G43.909] 02/06/2007  . OSTEOARTHRITIS [M19.90] 02/06/2007  . LOW BACK PAIN [M54.5] 02/06/2007  . Insomnia, unspecified [G47.00] 02/06/2007    Total Time spent with patient: 1 hour  Subjective:   Michelle [name] is a 56 y.o. female patient admitted with Bipolar disorder.  HPI:  Michelle [name] is a 56 y.o. female, seen, chart reviewed and case discussed with the patient, patient 2 daughters in at bedside and staff RN for this face-to-face psychiatric consultation and evaluation. Patient has been sitting in a chair next to bed, cooperative and has significant cognitive deficits and anxiety and left leg cramp. Patient has no current symptoms of mood swings, irritability, agitation or aggressive behavior. Patient reportedly relocated from [place], [place] to [place] about a year ago, he was seen mental health provider Dr. Rosaland Lao, M.D. at [facility] and now she has been established patient of Dr. Verta Ellen. Patient has been treated with multiple psychotropic medications including Seroquel, Paxil, Effexor and gabapentin. Patient did not do well tolerated reducing her Paxil from 40 mg to 30 mg by her outpatient psychiatrist. Reportedly patient has a history of suicidal attempt by benzodiazepine overdose in the past. Patient was admitted for elevated BNP and edema and status post CABG. Patient fell down while trying to walk to the bathroom yesterday. Patient reported she does not have the strength to hold on but denied any head injury. Patient to CT scan of the head was negative. Review of labs indicated patient has hypokalemia and patient daughter reported she has been suffering with chronic hypokalemia required potassium supplementation most of the time which is causing leg cramps and weakness. Patient has no suicidal or homicidal ideation.  Past medical history: Patient  with PMH of HTN (not on any medication currently), CVA, seizure, cerebral aneurysm and HLD (not on any medications) and ongoing tobacco abuse   Past Psychiatric History: Patient has been diagnosed with severe bipolar disorder, depressed moods, history of suicidal attempt with benzodiazepine overdose. Patient also reportedly have auditory/visual hallucinations about her deceased husband who passed away  6 years ago.  Patient has brain aneurysm rupture and subarachnoid hemorrhage about 3 years ago  12/21/2016 Interval history: Patient seen and spoke with patient daughter was at bedside for this psychiatric consultation follow-up. Reviewed labs indicated normal electrolytes and reviewed EKG which indicated prolonged QTC. We will discontinue her Seroquel and start Lamictal 25 mg twice daily for mood stabilization and also reduce her Effexor XL 250 mg starting tomorrow morning. Patient has no reported mood swings, irritability, agitation or depression. Patient seems to be more awake, alert,  and agree with the above changes. Patient is quite happy if she does not have any leg cramps and able to walk and even able to take shower today.  Risk to Self: Is patient at risk for suicide?: No Risk to Others:   Prior Inpatient Therapy:   Prior Outpatient Therapy:    Past Medical History:  Past Medical History:  Diagnosis Date  . ABDOMINAL PAIN RIGHT LOWER QUADRANT 07/26/2009  . Abdominal pain, left lower quadrant 07/26/2009  . ABDOMINAL PAIN, LOWER 11/29/2009  . ANEMIA-B12 DEFICIENCY 07/11/2009  . ANEMIA-IRON DEFICIENCY 06/21/2009  . ANXIETY 02/06/2007  . Arthritis    "back" (12/12/2016)  . BACK PAIN 07/01/2007  . Benzodiazepine overdose 12/03/2011   July 2013  . CAD (coronary artery disease)    LHC 12/12/16: Multivessel CAD  . CHEST PAIN 06/21/2009  . DEPRESSION 02/06/2007  . [place] DISEASE, CERVICAL 05/03/2009  . DYSPEPSIA 10/23/2007  . EAR PAIN, RIGHT 11/29/2009  . ELBOW PAIN, RIGHT 06/27/2010  . ELEVATED BLOOD PRESSURE WITHOUT DIAGNOSIS OF HYPERTENSION 07/01/2007  . FEVER UNSPECIFIED 06/03/2008  . FREQUENCY, URINARY 03/12/2007  . Genital herpes 01/03/2011  . GERD 07/11/2009  . Glossitis 10/23/2007  . HEAD TRAUMA, CLOSED 11/29/2009  . History of surgery for cerebral aneurysm 2015  . HYPERLIPIDEMIA 02/06/2007  . Insomnia, unspecified 02/06/2007  . LATERAL EPICONDYLITIS, LEFT 06/03/2008  . LOW BACK PAIN 02/06/2007  . Major depression  11/16/2010  . MIGRAINE HEADACHE 02/06/2007   "used to get them all the time; stopped a few years ago" (12/12/2016)  . OSTEOARTHRITIS 02/06/2007  . 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 OTITIS EXTERNA, RIGHT 10/04/2009  . Stroke [facility]) 2016-2017 X 3   RLE "goes to the side a little bit when I walk" (12/12/2016)  . VITAMIN B12 DEFICIENCY 07/26/2009    Past Surgical History:  Procedure Laterality Date  . ABDOMINAL HYSTERECTOMY  2005   "still have my ovaries"  . ANKLE FRACTURE SURGERY Right ?1991  . ANTERIOR CERVICAL DECOMP/DISCECTOMY FUSION  2008   C5-6  . APPENDECTOMY  04/2001   lap appy/notes 10/11/2010  . BACK SURGERY    . CARDIAC CATHETERIZATION  12/12/2016  . CEREBRAL ANEURYSM REPAIR    . CORONARY ARTERY BYPASS GRAFT N/A 12/17/2016   Procedure: CORONARY ARTERY BYPASS GRAFTING (CABG) x 4, using left internal mammary artery and right greater saphenous vein harvested endoscopically;  Surgeon: Gaye Pollack, MD;  Location: [place] OR;  Service: Open Heart Surgery;  Laterality: N/A;  . CYSTOSCOPY W/ URETEROSCOPY  02/17/2002    Cystoscopy, right retrograde pyelogram with interpretation, left ureteroscopy with holmium and right ureteroscopy/notes 10/11/2010  . INTRAOPERATIVE TRANSESOPHAGEAL ECHOCARDIOGRAM N/A 12/17/2016   Procedure: INTRAOPERATIVE TRANSESOPHAGEAL ECHOCARDIOGRAM;  Surgeon: Gaye Pollack, MD;  Location: [facility];  Service: Open Heart Surgery;  Laterality: N/A;  . LEFT HEART CATH AND CORONARY ANGIOGRAPHY N/A 12/12/2016   Procedure: Left Heart Cath and Coronary Angiography;  Surgeon: Jettie Booze, MD;  Location: [place] [facility] LAB;  Service: Cardiovascular;  Laterality: N/A;  . TUBAL LIGATION     Family History:  Family History  Problem Relation Age of Onset  . Diabetes Other   . Hypertension Other   . Coronary artery disease Other 46       Female 1st degree relative  . Cancer - Lung Father   . Cirrhosis Brother   . Diabetes Brother    Family Psychiatric  History:  Unknown  Social History:  History  Alcohol Use  . Yes    Comment: 12/12/2016 "2 drinks per month"     History  Drug Use No    Social History   Social History  . Marital status: Widowed    Spouse name: N/A  . Number of children: N/A  . Years of education: N/A   Occupational History  . 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 Smoking status: Former Smoker    Packs/day: 1.50    Years: 42.00    Types: Cigarettes    Quit date: 11/14/2016  . Smokeless tobacco: Never Used  . Alcohol use Yes     Comment: 12/12/2016 "2 drinks per month"  . Drug use: No  . Sexual activity: No   Other Topics Concern  . None   Social History Narrative   Currently living apart from husband   Additional Social History:    Allergies:   Allergies  Allergen Reactions  . Citalopram Other (See Comments)    "Feels wierd"  . Diclofenac Sodium Nausea Only    GI Upset  . Fluoxetine Hcl Nausea Only  . Ibuprofen Nausea Only    Labs:  Results for orders placed or performed during the hospital encounter of 12/12/16 (from the past 48 hour(s))  Glucose, capillary     Status: Abnormal   Collection Time: 12/19/16  4:50 PM  Result Value Ref Range   Glucose-Capillary 113 (H) 65 - 99 mg/dL   Comment 1 Notify RN   Glucose, capillary     Status: Abnormal   Collection Time: 12/19/16  7:39 PM  Result Value Ref Range   Glucose-Capillary 113 (H) 65 - 99 mg/dL   Comment 1 Notify RN   Glucose, capillary     Status: Abnormal   Collection Time: 12/19/16 11:09 PM  Result Value Ref Range   Glucose-Capillary 117 (H) 65 - 99 mg/dL   Comment 1 Notify RN   Glucose, capillary     Status: Abnormal   Collection Time: 12/20/16  3:38 AM  Result Value Ref Range   Glucose-Capillary 102 (H) 65 - 99 mg/dL   Comment 1 Notify RN   CBC     Status: Abnormal   Collection Time: 12/20/16  4:53 AM  Result Value Ref Range   WBC 13.5 (H) 4.0 - 10.5 K/uL   RBC 2.89 (L)  3.87 - 5.11 MIL/uL   Hemoglobin 8.5 (L) 12.0 - 15.0 g/dL   HCT 26.2 (L) 36.0 - 46.0 %   MCV 90.7 78.0 - 100.0 fL   MCH 29.4 26.0 - 34.0 pg   MCHC 32.4 30.0 - 36.0 g/dL   RDW 16.1 (H) 11.5 - 15.5 %   Platelets 198 150 - 400 K/uL  Basic metabolic panel     Status: Abnormal   Collection Time: 12/20/16  4:53 AM  Result Value Ref Range   Sodium 140 135 - 145 mmol/L   Potassium 3.0 (L) 3.5 - 5.1 mmol/L    Comment: DELTA  CHECK NOTED   Chloride 109 101 - 111 mmol/L   CO2 21 (L) 22 - 32 mmol/L   Glucose, Bld 100 (H) 65 - 99 mg/dL   BUN 13 6 - 20 mg/dL   Creatinine, Ser 0.77 0.44 - 1.00 mg/dL   Calcium 8.5 (L) 8.9 - 10.3 mg/dL   GFR calc non Af Amer >60 >60 mL/min   GFR calc Af Amer >60 >60 mL/min    Comment: (NOTE) The eGFR has been calculated using the CKD EPI equation. This calculation has not been validated in all clinical situations. eGFR's persistently <60 mL/min signify possible Chronic Kidney Disease.    Anion gap 10 5 - 15  Glucose, capillary     Status: None   Collection Time: 12/20/16  7:45 AM  Result Value Ref Range   Glucose-Capillary 99 65 - 99 mg/dL   Comment 1 Notify RN   Glucose, capillary     Status: None   Collection Time: 12/20/16 12:34 PM  Result Value Ref Range   Glucose-Capillary 91 65 - 99 mg/dL   Comment 1 Notify RN   Glucose, capillary     Status: Abnormal   Collection Time: 12/20/16  4:42 PM  Result Value Ref Range   Glucose-Capillary 123 (H) 65 - 99 mg/dL   Comment 1 Notify RN   Glucose, capillary     Status: None   Collection Time: 12/20/16  8:24 PM  Result Value Ref Range   Glucose-Capillary 97 65 - 99 mg/dL   Comment 1 Capillary Specimen   Glucose, capillary     Status: None   Collection Time: 12/21/16 12:09 AM  Result Value Ref Range   Glucose-Capillary 99 65 - 99 mg/dL   Comment 1 Capillary Specimen    Comment 2 Notify RN   Magnesium     Status: None   Collection Time: 12/21/16  2:47 AM  Result Value Ref Range   Magnesium 1.9 1.7 - 2.4  mg/dL  Comprehensive metabolic panel     Status: Abnormal   Collection Time: 12/21/16  2:47 AM  Result Value Ref Range   Sodium 141 135 - 145 mmol/L   Potassium 4.0 3.5 - 5.1 mmol/L    Comment: DELTA CHECK NOTED   Chloride 110 101 - 111 mmol/L   CO2 22 22 - 32 mmol/L   Glucose, Bld 98 65 - 99 mg/dL   BUN 14 6 - 20 mg/dL   Creatinine, Ser 0.71 0.44 - 1.00 mg/dL   Calcium 8.9 8.9 - 10.3 mg/dL   Total Protein 5.8 (L) 6.5 - 8.1 g/dL   Albumin 3.2 (L) 3.5 - 5.0 g/dL   AST 62 (H) 15 - 41 U/L   ALT 36 14 - 54 U/L   Alkaline Phosphatase 107 38 - 126 U/L   Total Bilirubin 0.7 0.3 - 1.2 mg/dL   GFR calc non Af Amer >60 >60 mL/min   GFR calc Af Amer >60 >60 mL/min    Comment: (NOTE) The eGFR has been calculated using the CKD EPI equation. This calculation has not been validated in all clinical situations. eGFR's persistently <60 mL/min signify possible Chronic Kidney Disease.    Anion gap 9 5 - 15    Current Facility-Administered Medications  Medication Dose Route Frequency Provider Last Rate Last Dose  . 0.9 %  sodium chloride infusion  250 mL Intravenous Continuous Conte, Tessa N, PA-C      . 0.9 %  sodium chloride infusion   Intravenous Continuous Harriet Pho,  Tessa N, PA-C   Stopped at 12/18/16 1200  . acetaminophen (TYLENOL) tablet 1,000 mg  1,000 mg Oral Q6H Conte, Tessa N, PA-C   1,000 mg at 12/21/16 2505   Or  . acetaminophen (TYLENOL) solution 1,000 mg  1,000 mg Per Tube Q6H Conte, Tessa N, PA-C   1,000 mg at 12/18/16 0501  . aspirin EC tablet 325 mg  325 mg Oral Daily Elgie Collard, [place]   325 mg at 12/20/16 3976   Or  . aspirin chewable tablet 324 mg  324 mg Per Tube Daily Elgie Collard, PA-C   324 mg at 12/21/16 7341  . atorvastatin (LIPITOR) tablet 40 mg  40 mg Oral QHS Elgie Collard, PA-C   40 mg at 12/20/16 2114  . Chlorhexidine Gluconate Cloth 2 % PADS 6 each  6 each Topical Daily Gaye Pollack, MD   6 each at 12/21/16 1000  . enoxaparin (LOVENOX) injection 40 mg  40 mg  Subcutaneous QHS Gaye Pollack, MD   40 mg at 12/20/16 2113  . gabapentin (NEURONTIN) capsule 300 mg  300 mg Oral TID Harriet Pho, Tessa N, PA-C   300 mg at 12/21/16 9379  . levalbuterol (XOPENEX) nebulizer solution 1.25 mg  1.25 mg Nebulization Q6H PRN Gaye Pollack, MD      . MEDLINE mouth rinse  15 mL Mouth Rinse BID Gaye Pollack, MD   15 mL at 12/21/16 0907  . metoprolol tartrate (LOPRESSOR) tablet 12.5 mg  12.5 mg Oral BID Gaye Pollack, MD   12.5 mg at 12/21/16 0902  . mupirocin ointment (BACTROBAN) 2 % 1 application  1 application Nasal BID Gaye Pollack, MD   1 application at 02/40/97 [phone]  . ondansetron (ZOFRAN) injection 4 mg  4 mg Intravenous Q6H PRN Harriet Pho, Tessa N, PA-C      . oxyCODONE (Oxy IR/ROXICODONE) immediate release tablet 5-10 mg  5-10 mg Oral Q3H PRN Conte, Tessa N, PA-C      . pantoprazole (PROTONIX) EC tablet 40 mg  40 mg Oral Daily Nicholes Rough N, PA-C   40 mg at 12/21/16 9924  . PARoxetine (PAXIL) tablet 40 mg  40 mg Oral Daily Elgie Collard, PA-C   40 mg at 12/21/16 2683  . potassium chloride SA (K-DUR,KLOR-CON) CR tablet 20 mEq  20 mEq Oral Q4H PRN Gaye Pollack, MD   20 mEq at 12/20/16 1359  . potassium chloride SA (K-DUR,KLOR-CON) CR tablet 40 mEq  40 mEq Oral BID Ambrose Finland, MD   40 mEq at 12/21/16 0902  . QUEtiapine (SEROQUEL) tablet 200 mg  200 mg Oral BID Ambrose Finland, MD   200 mg at 12/21/16 0902  . sodium chloride flush (NS) 0.9 % injection 3 mL  3 mL Intravenous Q12H Conte, Tessa N, PA-C   3 mL at 12/21/16 4196  . sodium chloride flush (NS) 0.9 % injection 3 mL  3 mL Intravenous PRN Conte, Tessa N, PA-C      . traMADol Veatrice Bourbon) tablet 50-100 mg  50-100 mg Oral Q4H PRN Conte, Tessa N, PA-C      . venlafaxine XR (EFFEXOR-XR) 24 hr capsule 225 mg  225 mg Oral Daily Ambrose Finland, MD   225 mg at 12/21/16 2229   Facility-Administered Medications Ordered in Other Encounters  Medication Dose Route Frequency Provider Last  Rate Last Dose  . etomidate (AMIDATE) injection    Anesthesia Intra-op Wilburn Cornelia, CRNA   12 mg at 12/19/16  1323  . succinylcholine (ANECTINE) injection    Anesthesia Intra-op Wilburn Cornelia, CRNA   100 mg at 12/19/16 1323    Musculoskeletal: Strength & Muscle Tone: decreased Gait & Station: unsteady Patient leans: N/A  Psychiatric Specialty Exam: Physical Exam as per history and physical   ROS status post CABG generalized weakness, memory deficits, lower extremity weakness, left leg cramps, hypokalemia and history of bipolar depression. Patient denied nausea, vomiting, abdomen pain, shortness of breath and chest pain. No Fever-chills, No Headache, No changes with Vision or hearing, reports vertigo No problems swallowing food or Liquids, No Chest pain, Cough or Shortness of Breath, No Abdominal pain, No Nausea or Vommitting, Bowel movements are regular, No Blood in stool or Urine, No dysuria, No new skin rashes or bruises, No new joints pains-aches,  No new weakness, tingling, numbness in any extremity, No recent weight gain or loss, No polyuria, polydypsia or polyphagia,  A full 10 point Review of Systems was done, except as stated above, all other Review of Systems were negative.   Blood pressure 113/62, pulse 97, temperature 97.8 F (36.6 C), temperature source Oral, resp. rate (!) 24, height '5\' 2"'  (1.575 m), weight 68.5 kg (151 lb 0.2 oz), SpO2 99 %.Body mass index is 27.62 kg/m.  General Appearance: Guarded  Eye Contact:  Good  Speech:  Clear and Coherent and Slow  Volume:  Decreased  Mood:  Depressed  Affect:  Constricted and Depressed  Thought Process:  Coherent and Goal Directed  Orientation:  Full (Time, Place, and Person)  Thought Content:  Logical  Suicidal Thoughts:  No  Homicidal Thoughts:  No  Memory:  Immediate;   Fair Recent;   Poor Remote;   Poor  Judgement:  Impaired  Insight:  Fair  Psychomotor Activity:  Psychomotor Retardation and Restlessness   Concentration:  Concentration: Fair and Attention Span: Poor  Recall:  Poor  Fund of Knowledge:  Fair  Language:  Fair  Akathisia:  Negative  Handed:  Right  AIMS (if indicated):     Assets:  Communication Skills Desire for Improvement Financial Resources/Insurance Housing Leisure Time Resilience Social Support Transportation  ADL's:  Impaired  Cognition:  Impaired,  Mild  Sleep:        Treatment Plan Summary: 56 years old female with history of bipolar depression, generalized anxiety and history of suicidal attempt admitted for elevated BNP and edema. Patient has status post CABGx4, 2 days ago. She has a preformed) to walk to the bathroom yesterday but no known head injury.  Bipolar disorder most recent episode is unspecified  Recommendation: Continue Kdur 40 mg twice daily for 2 days as scheduled for chronic hypokalemia Will reduce Effexor XR to 150 mg daily starting tomorrow morning and with plan of tapering off at out patient to avoid polypharmacy Discontinue Seroquel due to QTC prolongation We start lamotrigine 25 mg twice daily for mood swings.  Continue Paxil 40 mg daily as she did not tolerate reducing it during last month Continue gabapentin 300 mg 3 times daily and may use as needed if required Monitor for the electrolytes as planned  Appreciate psychiatric consultation and we sign off as of today Please contact [phone] or [phone] if needs further assistance    Disposition: Patient will be referred to the outpatient psychiatric medication management when medically stable.  No evidence of imminent risk to self or others at present.   Supportive therapy provided about ongoing stressors.  Ambrose Finland, MD 12/21/2016 12:04 PM

## 2016-12-21 NOTE — Discharge Summary (Signed)
Physician Discharge Summary       [address]       [place] [address]             [phone]    Patient ID: Michelle [name] MRN: [id number] DOB/AGE: Aug 14, 1960 56 y.o.  Admit date: 12/12/2016 Discharge date: 12/23/2016  Admission Diagnoses: 1. Angina pectoris ([place]) 2. Coronary artery disease  Active Diagnoses:  1.  HTN (hypertension) 2. Hyperlipidemia, unspecified 3. Tobacco abuse 4. Low back pain 5.Severe bipolar I disorder, current or most recent episode depressed ([place]) 6. Cervical disc disease 7. GERD  8. Left lateral epicondylitis 9. Migraine headache 10. Vitamin B12 deficiency 11. Osetoarthritis 12. ABL anemia  Consult: Psychiatry  Procedure (s):  Left Heart Cath and Coronary Angiography by Dr. Irish Lack on 12/12/2016:  Conclusion     Mid RCA lesion, 60 %stenosed.  RPDA lesion, 95 %stenosed.  Dist RCA lesion, 95 %stenosed.  Post Atrio lesion, 95 %stenosed.  Mid LAD lesion, 80 %stenosed.  Ost 2nd Diag to 2nd Diag lesion, 75 %stenosed.  The left ventricular systolic function is normal.  LV end diastolic pressure is normal.  The left ventricular ejection fraction is 55-65% by visual estimate.  There is no aortic valve stenosis.   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   It is reasonable to consider CABG given the bifurcation disease, although there are percutaneous options as well.  I think a consult is warranted.  She has had some rest pain.  Will admit to inpatient service.  Start heparin 6 hours post sheath pull.      1. Median Sternotomy 2. Extracorporeal circulation 3.   Coronary artery bypass grafting x 4   Left internal mammary graft to the LAD  SVG to diagonal  Sequential SVG to PDA and PL  4.   Endoscopic vein harvest from the right leg by Dr. Cyndia Bent on 12/17/2016.  History of Presenting Illness: The patient is a 56 year old woman with hyperlipidemia, hypertension,  and ongoing smoking who was evaluated by Dr. Acie Fredrickson one month ago for edema in her legs and elevated BNP as well as substernal burning pain that she thought was heartburn. She was initially seen by her PCP and started on lasix with improvement in her edema. She underwent a nuclear stress test on 7/12 that was abnormal showing inferior and inferolateral ischemia. She was scheduled for echo and cath yesterday. Her echo showed an EF of 60-65% with grade 1 diastolic dysfunction. Valves ok. Cath yesterday showed severe 2-vessel CAD with 80% proximal to mid LAD, 75% ostial diagonal, 95% distal RCA bifurcation stenosis extending into PDA and distal RCA.  She reports a several month history of exertional fatigue and shortness of breath. She has SSCP that is like heartburn occurring with exertion and at rest. Swelling in both legs that was marked prior to going to her PCP and she never had that before.  Dr. Cyndia Bent discussed with the patient the need for coronary artery bypass grafting surgery. Potential risks, benefits, and complications were discussed with the patient and she agreed to proceed with surgery. Pre operative carotid duplex showed no significant internal carotid artery stenosis bilaterally. ABI's were 1.19 on the right and 1.16 on the left. She underwent a CABG x 4 on 12/17/2016.  Brief Hospital Course:  The patient was extubated the morning of post operative day one without difficulty. She remained afebrile and hemodynamically stable. She was weaned off of Neo synephrine and Dopamine  drips. Gordy Councilman, a line, chest tubes, and foley were removed early in the post operative course.  She did have delirium post op but this did improve with time. Because of her past medical history, psychiatry was consulted and recommendations were followed accordingly. Lopressor was started and titrated accordingly. She was volume over loaded and diuresed. She had ABL anemia. She did not require a post op transfusion. Last  H and H was 8.5/26.2 . She was weaned off the insulin drip. The patient's HGA1C pre op was 5.1. The patient was felt surgically stable for transfer from the ICU to PCTU for further convalescence on 12/21/2016. She continues to progress with cardiac rehab. He/she was ambulating on room air. He/she has been tolerating a diet and has had a bowel movement. Epicardial pacing wires were removed on 12/22/2016. Chest tube sutures will be removed in the office after discharge. The patient is felt surgically stable for discharge today.   Latest Vital Signs: Blood pressure 123/85, pulse (!) 105, temperature 99.1 F (37.3 C), temperature source Oral, resp. rate (!) 21, height 5\' 2"  (1.575 m), weight 67.9 kg (149 lb 12.8 oz), SpO2 97 %.  Physical Exam: General appearance: alert, cooperative and seems back to baseline Neurologic: intact Heart: regular rate and rhythm, S1, S2 normal, no murmur, click, rub or gallop Lungs: clear to auscultation bilaterally Extremities: extremities normal, atraumatic, no cyanosis or edema Wound: incisions ok  Discharge Condition:Stable and discharged to home.  Recent laboratory studies:  Lab Results  Component Value Date   WBC 13.5 (H) 12/20/2016   HGB 8.5 (L) 12/20/2016   HCT 26.2 (L) 12/20/2016   MCV 90.7 12/20/2016   PLT 198 12/20/2016   Lab Results  Component Value Date   NA 141 12/21/2016   K 4.0 12/21/2016   CL 110 12/21/2016   CO2 22 12/21/2016   CREATININE 0.71 12/21/2016   GLUCOSE 98 12/21/2016    Diagnostic Studies:  Ct Head Wo Contrast  Result Date: 12/19/2016 CLINICAL DATA:  56 year old female status post fall from standing EXAM: CT HEAD WITHOUT CONTRAST TECHNIQUE: Contiguous axial images were obtained from the base of the skull through the vertex without intravenous contrast. COMPARISON:  Prior head CT 11/30/2009 FINDINGS: Brain: Negative for acute intracranial hemorrhage, acute infarction, mass, mass effect, hydrocephalus or midline shift.  Gray-white differentiation is preserved throughout. 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 Expected encephalomalacia in the anterior aspect of the right temporal lobe likely secondary to the prior aneurysm clipping. Additionally, there is a new 1.2 cm low-attenuation focus in the left cerebellar hemisphere which was not present on 11/30/2009. Vascular: No hyperdense vessel or unexpected calcification. Skull: Normal. Negative for fracture or focal lesion. Sinuses/Orbits: No acute finding. Other: None. IMPRESSION: 1. No acute intracranial abnormality. 2. 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 3. Encephalomalacia in the anterior right temporal lobe likely secondary to focal ischemia related to the aneurysm clipping. 4. New circumscribed low-attenuation in the left cerebellar hemisphere compared to 11/30/2009 likely represents an interval (but remote) infarct. Electronically Signed   By: Jacqulynn Cadet M.D.   On: 12/19/2016 14:35   Dg Chest Port 1 View  Result Date: 12/19/2016 CLINICAL DATA:  Status post CABG 3 days ago. Patient sustained a fall and subsequently was re- intubated. The endotracheal tube and orogastric tubes are new. EXAM: PORTABLE CHEST 1 VIEW COMPARISON:  Portable chest x-ray of earlier today,  December 19, 2016 FINDINGS: The lungs are adequately inflated. There is no pneumothorax. There is hazy increased density at the left lung base consistent with some residual atelectasis. The heart and pulmonary vascularity are normal. An external pacemaker defibrillator pad is present overlying the right upper lung. The endotracheal tube tip measures approximately 2.2 cm above the carina. The esophagogastric tube tip in proximal port lie below the GE junction. IMPRESSION: Improved aeration of both lungs. Persistent left lower lobe atelectasis. The endotracheal and  esophagogastric tubes appear to be in reasonable position.   Electronically Signed   By: David  Martinique M.D.   On: 12/19/2016 13:51  Discharge Instructions    Amb Referral to Cardiac Rehabilitation    Complete by:  As directed    Diagnosis:  CABG   CABG X ___:  4      Discharge Medications: Allergies as of 12/23/2016      Reactions   Citalopram Other (See Comments)   "Feels wierd"   Diclofenac Sodium Nausea Only   GI Upset   Fluoxetine Hcl Nausea Only   Ibuprofen Nausea Only      Medication List    STOP taking these medications   QUEtiapine 200 MG tablet Commonly known as:  SEROQUEL     TAKE these medications   aspirin 325 MG EC tablet Take 1 tablet (325 mg total) by mouth daily. What changed:  medication strength  how much to take   atorvastatin 40 MG tablet Commonly known as:  LIPITOR Take 1 tablet (40 mg total) by mouth daily.   furosemide 20 MG tablet Commonly known as:  LASIX TAKE 1 TABLET BY MOUTH EVERY DAY   gabapentin 300 MG capsule Commonly known as:  NEURONTIN Take 300 mg by mouth 3 (three) times daily.   lamoTRIgine 25 MG tablet Commonly known as:  LAMICTAL Take 1 tablet (25 mg total) by mouth 2 (two) times daily.   metoprolol tartrate 25 MG tablet Commonly known as:  LOPRESSOR Take 0.5 tablets (12.5 mg total) by mouth 2 (two) times daily.   multivitamin with minerals Tabs tablet Take 1 tablet by mouth daily.   PARoxetine 40 MG tablet Commonly known as:  PAXIL Take 40 mg by mouth daily.   polyethylene glycol powder powder Commonly known as:  GLYCOLAX/MIRALAX Take 17 g by mouth 2 (two) times daily as needed. What changed:  when to take this   potassium chloride 10 MEQ tablet Commonly known as:  K-DUR Take 1 tablet (10 mEq total) by mouth daily.   ranitidine 150 MG tablet Commonly known as:  ZANTAC Take 1 tablet (150 mg total) by mouth 2 (two) times daily.   traMADol 50 MG tablet Commonly known as:  ULTRAM Take 1 tablet (50 mg  total) by mouth every 6 (six) hours as needed for moderate pain.   venlafaxine XR 150 MG 24 hr capsule Commonly known as:  EFFEXOR-XR Take 1 capsule (150 mg total) by mouth daily. What changed:  how much to take      The patient has been discharged on:   1.Beta Blocker:  Yes [ x  ]                              No   [   ]  If No, reason:  2.Ace Inhibitor/ARB: Yes [   ]                                     No  [  x  ]                                     If No, reason: titration of BB  3.Statin:   Yes [x   ]                  No  [   ]                  If No, reason:  4.Ecasa:  Yes  [ x  ]                  No   [   ]                  If No, reason:  Follow Up Appointments: Follow-up Information    Gaye Pollack, MD Follow up on 01/23/2017.   Specialty:  Cardiothoracic Surgery Why:  PA/LAT CXR to be taken (at [facility] which is in the same building as Dr. Lucianne Lei Trigt's office on 01/23/2017 at 9:00 am;Appointment time is at 9:30 am  Contact information: [place] [address] [phone]        Nahser, Wonda Cheng, MD Follow up.   Specialty:  Cardiology Contact information: [address] [place] [address] [phone]        Nurse Follow up on 12/27/2016.   Why:  Appointment is with nurse only to have chest tube sutures removed. Appointment time is at 10:30 am Contact information: [place] [address]          Signed: Alvester Chou 12/23/2016, 2:21 PM

## 2016-12-21 NOTE — Progress Notes (Signed)
4 Days Post-Op Procedure(s) (LRB): CORONARY ARTERY BYPASS GRAFTING (CABG) x 4, using left internal mammary artery and right greater saphenous vein harvested endoscopically (N/A) INTRAOPERATIVE TRANSESOPHAGEAL ECHOCARDIOGRAM (N/A) Subjective:  No complaints.   Objective: Vital signs in last 24 hours: Temp:  [97.9 F (36.6 C)-98.7 F (37.1 C)] 98.5 F (36.9 C) (07/27 0354) Pulse Rate:  [98-117] 98 (07/27 0400) Cardiac Rhythm: Normal sinus rhythm (07/27 0400) Resp:  [14-29] 24 (07/27 0600) BP: (91-120)/(61-77) 113/62 (07/27 0600) SpO2:  [91 %-95 %] 93 % (07/27 0400) Weight:  [68.5 kg (151 lb 0.2 oz)] 68.5 kg (151 lb 0.2 oz) (07/27 0500)  Hemodynamic parameters for last 24 hours:    Intake/Output from previous day: 07/26 0701 - 07/27 0700 In: 580 [P.O.:580] Out: 675 [Urine:675] Intake/Output this shift: No intake/output data recorded.  General appearance: alert, cooperative and seems back to baseline Neurologic: intact Heart: regular rate and rhythm, S1, S2 normal, no murmur, click, rub or gallop Lungs: clear to auscultation bilaterally Extremities: extremities normal, atraumatic, no cyanosis or edema Wound: incisions ok  Lab Results:  Recent Labs  12/19/16 0325 12/20/16 0453  WBC 16.1* 13.5*  HGB 7.9* 8.5*  HCT 25.0* 26.2*  PLT 208 198   BMET:  Recent Labs  12/20/16 0453 12/21/16 0247  NA 140 141  K 3.0* 4.0  CL 109 110  CO2 21* 22  GLUCOSE 100* 98  BUN 13 14  CREATININE 0.77 0.71  CALCIUM 8.5* 8.9    PT/INR: No results for input(s): LABPROT, INR in the last 72 hours. ABG    Component Value Date/Time   PHART 7.384 12/18/2016 1013   HCO3 18.7 (L) 12/18/2016 1013   TCO2 19 12/18/2016 1723   ACIDBASEDEF 5.0 (H) 12/18/2016 1013   O2SAT 91.0 12/18/2016 1013   CBG (last 3)   Recent Labs  12/20/16 1642 12/20/16 2024 12/21/16 0009  GLUCAP 123* 97 99    Assessment/Plan: S/P Procedure(s) (LRB): CORONARY ARTERY BYPASS GRAFTING (CABG) x 4, using  left internal mammary artery and right greater saphenous vein harvested endoscopically (N/A) INTRAOPERATIVE TRANSESOPHAGEAL ECHOCARDIOGRAM (N/A)  POD 4 She is hemodynamically stable in sinus rhythm. Continue low dose Lopressor  Bipolar disorder with postop delirium: seems back to basline now. Meds have been adjusted by psychiatry and I really appreciate prompt evaluation.  Weight is back to baseline  DC foley  Transfer to 4E and continue mobilization and IS.  She should be able to go home in a day or so with her daughters staying with her.   LOS: 9 days    Michelle [name] 12/21/2016

## 2016-12-22 NOTE — Progress Notes (Signed)
CARDIAC REHAB PHASE I   PRE:  Rate/Rhythm: 109 ST  BP:  Sitting: 137/86        SaO2: 96 RA  MODE:  Ambulation: 380 ft   POST:  Rate/Rhythm: 130 ST  BP:  Sitting: 151/84         SaO2: 91-94 RA  Pt ambulated 380 ft on RA, rolling walker, assist x1, steady gait, tolerated fairly well. Pt c/o "feeling winded," HR somewhat elevated, declined rest stop. Encouraged pursed lip breathing, additional ambulation today. Pt and family state they have no questions regarding education at this time. Pt to recliner after walk, call bell within reach. Pt hopeful for discharge.   [phone] [name], RN, BSN 12/22/2016 11:28 AM

## 2016-12-22 NOTE — Progress Notes (Addendum)
      [address]       [place] [address]             [phone]      5 Days Post-Op Procedure(s) (LRB): CORONARY ARTERY BYPASS GRAFTING (CABG) x 4, using left internal mammary artery and right greater saphenous vein harvested endoscopically (N/A) INTRAOPERATIVE TRANSESOPHAGEAL ECHOCARDIOGRAM (N/A) Subjective: Tearful this morning. States she is depressed about being in the hospital.   Objective: Vital signs in last 24 hours: Temp:  [97.8 F (36.6 C)-98.2 F (36.8 C)] 98.2 F (36.8 C) (07/28 0427) Pulse Rate:  [97-101] 99 (07/27 1925) Cardiac Rhythm: Sinus tachycardia (07/28 0700) Resp:  [20] 20 (07/27 1925) BP: (105)/(84) 105/84 (07/27 1925) SpO2:  [95 %-99 %] 95 % (07/27 1925) Weight:  [69 kg (152 lb 1.6 oz)] 69 kg (152 lb 1.6 oz) (07/28 0427)  Hemodynamic parameters for last 24 hours:    Intake/Output from previous day: No intake/output data recorded. Intake/Output this shift: Total I/O In: 240 [P.O.:240] Out: -   General appearance: alert, cooperative and no distress Heart: sinus tachycardia Lungs: clear to auscultation bilaterally Abdomen: soft, non-tender; bowel sounds normal; no masses,  no organomegaly Extremities: extremities normal, atraumatic, no cyanosis or edema Wound: clean and dry, no drainage. R EVH site c/d/i  Lab Results:  Recent Labs  12/20/16 0453  WBC 13.5*  HGB 8.5*  HCT 26.2*  PLT 198   BMET:  Recent Labs  12/20/16 0453 12/21/16 0247  NA 140 141  K 3.0* 4.0  CL 109 110  CO2 21* 22  GLUCOSE 100* 98  BUN 13 14  CREATININE 0.77 0.71  CALCIUM 8.5* 8.9    PT/INR: No results for input(s): LABPROT, INR in the last 72 hours. ABG    Component Value Date/Time   PHART 7.384 12/18/2016 1013   HCO3 18.7 (L) 12/18/2016 1013   TCO2 19 12/18/2016 1723   ACIDBASEDEF 5.0 (H) 12/18/2016 1013   O2SAT 91.0 12/18/2016 1013   CBG (last 3)   Recent Labs  12/20/16 1642 12/20/16 2024 12/21/16 0009  GLUCAP 123* 97 99     Assessment/Plan: S/P Procedure(s) (LRB): CORONARY ARTERY BYPASS GRAFTING (CABG) x 4, using left internal mammary artery and right greater saphenous vein harvested endoscopically (N/A) INTRAOPERATIVE TRANSESOPHAGEAL ECHOCARDIOGRAM (N/A)  1. CV-ST, rate 100s, BP stable. Tolerating low-dose BB, Lipitor, ASA. EPW out 2. Pulm-tolerating room air with good oxygen saturations. Continue incentive spirometry.  3. Renal-creatinine 0.71 yesterday. Not on Lasix. Remains on Potassium supplementation per Psych.  4. Endo-blood glucose level well controlled. CBGs discontinued.  5. Psych- On Effexor XR 150mg  daily, Lamotrigine 25mg  BID, Paxil 40mg  daily, anad Gabapentin 300mg  TID. Will need to follow-up with her outpatient Psychiatrist.   Plan: Ambulate in the halls. Discontinue EPW this morning. Encourage incentive spirometry. Possibly home later today or tomorrow morning.     LOS: 10 days    Elgie Collard 12/22/2016 patient examined and medical record reviewed,agree with above note. Tharon Aquas Trigt III 12/22/2016

## 2016-12-23 MED ORDER — VENLAFAXINE HCL ER 150 MG PO CP24: 150.0000 mg | ORAL_CAPSULE | Freq: Every day | ORAL | 1 refills | Status: DC

## 2016-12-23 MED ORDER — ASPIRIN 325 MG PO TBEC: 325.0000 mg | DELAYED_RELEASE_TABLET | Freq: Every day | ORAL | 0 refills | Status: DC

## 2016-12-23 MED ORDER — LAMOTRIGINE 25 MG PO TABS: 25.0000 mg | ORAL_TABLET | Freq: Two times a day (BID) | ORAL | 1 refills | Status: DC

## 2016-12-23 MED ORDER — METOPROLOL TARTRATE 25 MG PO TABS: 12.5000 mg | ORAL_TABLET | Freq: Two times a day (BID) | ORAL | 1 refills | Status: DC

## 2016-12-23 MED ORDER — POTASSIUM CHLORIDE ER 10 MEQ PO TBCR: 10.0000 meq | EXTENDED_RELEASE_TABLET | Freq: Every day | ORAL | 1 refills | Status: DC

## 2016-12-23 MED ORDER — TRAMADOL HCL 50 MG PO TABS: 50.0000 mg | ORAL_TABLET | Freq: Four times a day (QID) | ORAL | 0 refills | Status: DC | PRN

## 2016-12-23 NOTE — Progress Notes (Signed)
      [address]       [name] [address]             [phone]      6 Days Post-Op Procedure(s) (LRB): CORONARY ARTERY BYPASS GRAFTING (CABG) x 4, using left internal mammary artery and right greater saphenous vein harvested endoscopically (N/A) INTRAOPERATIVE TRANSESOPHAGEAL ECHOCARDIOGRAM (N/A) Subjective: The patient states that she walked better today with cardiac rehab. She feels good and wants to go home.   Objective: Vital signs in last 24 hours: Temp:  [97.9 F (36.6 C)-99.1 F (37.3 C)] 99.1 F (37.3 C) (07/29 1200) Pulse Rate:  [104-105] 105 (07/29 1200) Cardiac Rhythm: Sinus tachycardia (07/29 0920) Resp:  [20-26] 21 (07/29 1200) BP: (92-124)/(59-97) 123/85 (07/29 1200) SpO2:  [97 %-98 %] 97 % (07/29 1200) Weight:  [67.9 kg (149 lb 12.8 oz)] 67.9 kg (149 lb 12.8 oz) (07/29 0433)     Intake/Output from previous day: 07/28 0701 - 07/29 0700 In: 1080 [P.O.:1080] Out: -  Intake/Output this shift: Total I/O In: 360 [P.O.:360] Out: -   General appearance: alert, cooperative and no distress Heart: sinus tachycardia Lungs: clear to auscultation bilaterally Abdomen: soft, non-tender; bowel sounds normal; no masses,  no organomegaly Extremities: extremities normal, atraumatic, no cyanosis or edema Wound: clean and dry  Lab Results: No results for input(s): WBC, HGB, HCT, PLT in the last 72 hours. BMET:  Recent Labs  12/21/16 0247  NA 141  K 4.0  CL 110  CO2 22  GLUCOSE 98  BUN 14  CREATININE 0.71  CALCIUM 8.9    PT/INR: No results for input(s): LABPROT, INR in the last 72 hours. ABG    Component Value Date/Time   PHART 7.384 12/18/2016 1013   HCO3 18.7 (L) 12/18/2016 1013   TCO2 19 12/18/2016 1723   ACIDBASEDEF 5.0 (H) 12/18/2016 1013   O2SAT 91.0 12/18/2016 1013   CBG (last 3)   Recent Labs  12/20/16 1642 12/20/16 2024 12/21/16 0009  GLUCAP 123* 97 99    Assessment/Plan: S/P Procedure(s) (LRB): CORONARY ARTERY  BYPASS GRAFTING (CABG) x 4, using left internal mammary artery and right greater saphenous vein harvested endoscopically (N/A) INTRAOPERATIVE TRANSESOPHAGEAL ECHOCARDIOGRAM (N/A)  1. CV-ST, rate 100s, BP low at times. Tolerating low-dose BB, Lipitor, ASA. EPW out 2. Pulm-tolerating room air with good oxygen saturations. Continue incentive spirometry.  3. Renal-creatinine 0.71 yesterday. Not on Lasix. Remains on Potassium supplementation per Psych.  4. Endo-blood glucose level well controlled. CBGs discontinued.  5. Psych- On Effexor XR 150mg  daily, Lamotrigine 25mg  BID, Paxil 40mg  daily, and Gabapentin 300mg  TID. Will need to follow-up with her outpatient Psychiatrist.   Plan: Likely discharge today.    LOS: 11 days    Elgie Collard 12/23/2016

## 2016-12-23 NOTE — Progress Notes (Signed)
No issues at present. IV removed. Reviewed discharge instructions with patient and family. Awaiting the wheelchair for discharge.   Davonne Jarnigan, Mervin Kung RN

## 2016-12-23 NOTE — Care Management Note (Signed)
Case Management Note Original Note Created Bethena Roys, RN 12/13/2016, 3:24 PM   Patient Details  Name: Michelle [name] MRN: [id number] Date of Birth: 04-Dec-1960  Subjective/Objective:  Pt presented for Chest Pressure and lower extremity edema. Post Cardiac Cath 12-12-16- Revealed Severe two vessel disease with both lesions being at significant bifurcations. Plan for CABG Monday. Pt continues on IV Heparin Gtt. Pt is from home with support of her mother and grandmother. Pt has DME Cane & RW.                 Action/Plan: CM will continue to monitor for additional needs.   Expected Discharge Date:  12/23/16               Expected Discharge Plan:  [facility]  In-House Referral:  NA  Discharge planning Services  CM Consult  Post Acute Care Choice:    Choice offered to:     DME Arranged:    DME Agency:     HH Arranged:    [place] Agency:     Status of Service:  Completed, signed off  If discussed at [employer] of Stay Meetings, dates discussed:     Discharge Disposition: home/self care   Additional Comments:  12/23/16- 1530- Marvetta Gibbons RN, CM- pt s/p CABG on 12/17/16-  Pt for d/c home today with family to assist- no CM needs noted for discharge.   Dahlia Client Bolivar, RN 12/23/2016, 3:28 PM [phone]

## 2016-12-23 NOTE — Discharge Instructions (Signed)

## 2016-12-26 ENCOUNTER — Telehealth (HOSPITAL_COMMUNITY): Payer: Self-pay

## 2016-12-26 NOTE — Telephone Encounter (Signed)
Patient insurance is active and benefits verified. Patient insurance is BCBS - no co-payment, deductible $400/$400 has been met, out of pocket $800/$800 has been met, 30/% co-insurance and no pre-authorization. Passport/reference [id number].   Patient will be contacted and scheduled after their follow up appointment with the surgeon on 01/23/17, upon review by [facility] RN navigator.

## 2016-12-27 ENCOUNTER — Ambulatory Visit (INDEPENDENT_AMBULATORY_CARE_PROVIDER_SITE_OTHER): Payer: Self-pay

## 2016-12-27 DIAGNOSIS — Z4802 Encounter for removal of sutures: Secondary | ICD-10-CM

## 2016-12-27 MED ORDER — TRAMADOL HCL 50 MG PO TABS: 50.0000 mg | ORAL_TABLET | Freq: Four times a day (QID) | ORAL | 0 refills | Status: DC | PRN

## 2016-12-27 NOTE — Progress Notes (Signed)
Removed 3 sutures from chest tube sites with no signs of infection and patient tolerated well. RX refill for Tramadol was faxed to [facility].

## 2017-01-10 ENCOUNTER — Encounter: Payer: Self-pay | Admitting: Family Medicine

## 2017-01-19 ENCOUNTER — Other Ambulatory Visit: Payer: Self-pay | Admitting: Physician Assistant

## 2017-01-22 ENCOUNTER — Other Ambulatory Visit: Payer: Self-pay | Admitting: Surgery

## 2017-01-22 DIAGNOSIS — Z951 Presence of aortocoronary bypass graft: Secondary | ICD-10-CM

## 2017-01-23 ENCOUNTER — Encounter: Payer: Self-pay | Admitting: Surgery

## 2017-01-23 ENCOUNTER — Ambulatory Visit (INDEPENDENT_AMBULATORY_CARE_PROVIDER_SITE_OTHER): Payer: Self-pay | Admitting: Surgery

## 2017-01-23 ENCOUNTER — Ambulatory Visit
Admission: RE | Admit: 2017-01-23 | Discharge: 2017-01-23 | Disposition: A | Discharge status: Home / Self Care | Payer: BLUE CROSS/BLUE SHIELD | Source: Ambulatory Visit | Attending: Surgery | Admitting: Surgery

## 2017-01-23 VITALS — BP 111/76 | HR 100 | Resp 20 | Ht 62.0 in | Wt 148.0 lb

## 2017-01-23 DIAGNOSIS — Z951 Presence of aortocoronary bypass graft: Secondary | ICD-10-CM

## 2017-01-23 NOTE — Progress Notes (Signed)
HPI: Patient returns for routine postoperative follow-up having undergone CABG x 4 on 12/17/2016. The patient's early postoperative recovery while in the hospital was notable for some postop delirium related to a history of severe bipolar disorder. Psychiatry referral was made but shortly before that she got up out of bed alone and fell and was lethargic and emergently reintubated. Her head CT was negative and she woke up and was extubated. Psychiatry saw her and adjusted her meds.  Since hospital discharge the patient reports that she is walking with a cane. She denies chest pain or shortness of breath. She has mild incisional discomfort. She has pain in her legs which she has been on Neurontin chronically for.   Current Outpatient Prescriptions  Medication Sig Dispense Refill  . aspirin 325 MG EC tablet Take 1 tablet (325 mg total) by mouth daily. 30 tablet 0  . atorvastatin (LIPITOR) 40 MG tablet Take 1 tablet (40 mg total) by mouth daily. 30 tablet 3  . furosemide (LASIX) 20 MG tablet TAKE 1 TABLET BY MOUTH EVERY DAY 30 tablet 1  . gabapentin (NEURONTIN) 300 MG capsule Take 300 mg by mouth 3 (three) times daily.    . metoprolol tartrate (LOPRESSOR) 25 MG tablet Take 0.5 tablets (12.5 mg total) by mouth 2 (two) times daily. 60 tablet 1  . Multiple Vitamin (MULTIVITAMIN WITH MINERALS) TABS tablet Take 1 tablet by mouth daily.    Marland Kitchen PARoxetine (PAXIL) 40 MG tablet Take 40 mg by mouth daily.  2  . polyethylene glycol powder (GLYCOLAX/MIRALAX) powder Take 17 g by mouth 2 (two) times daily as needed. (Patient taking differently: Take 17 g by mouth daily. ) 3350 g 1  . potassium chloride (K-DUR) 10 MEQ tablet Take 1 tablet (10 mEq total) by mouth daily. 30 tablet 1  . ranitidine (ZANTAC) 150 MG tablet Take 1 tablet (150 mg total) by mouth 2 (two) times daily. 30 tablet 1  . traMADol (ULTRAM) 50 MG tablet Take 1 tablet (50 mg total) by mouth every 6 (six) hours as needed for moderate pain. 30  tablet 0   No current facility-administered medications for this visit.     Physical Exam: BP 111/76   Pulse 100   Resp 20   Ht 5\' 2"  (1.575 m)   Wt 148 lb (67.1 kg)   SpO2 97% Comment: RA  BMI 27.07 kg/m  She looks well. Affect appears at baseline. Lung exam is clear. Cardiac exam shows a regular rate and rhythm with normal heart sounds. Chest incision is healing well and sternum is stable. The leg incisions are healing well and there is no peripheral edema.   Diagnostic Tests:    CLINICAL DATA:  Shortness of breath and bilateral lower extremity pain. One month postop from CABG.  EXAM: CHEST  2 VIEW  COMPARISON:  12/19/2016 from [facility]: The heart size and mediastinal contours are within normal limits. Prior CABG again noted. Both lungs are clear. No evidence of pneumothorax or pleural effusion. Cervical spine fusion hardware again noted.  IMPRESSION: No active cardiopulmonary disease.   Electronically Signed   By: Earle Gell M.D.   On: 01/23/2017 09:33      Impression:  Overall I think she is doing well. I encouraged her to continue walking. She is planning to participate in cardiac rehab and I think she can start that now. She has not heard from rehab so we will make the referral today. I told her  that she should not lift anything heavier than 10 lbs for three months postop.    Plan:  She is going to follow up with her PCP and Dr. Acie Fredrickson and will continue to see her psychiatrist concerning her bipolar disorder. She will return to see me if she has any problems with her incisions.   Gaye Pollack, MD [facility] [phone]

## 2017-01-30 NOTE — Addendum Note (Signed)
Addended by: Fanny Bien R on: 01/30/2017 12:12 PM   Modules accepted: Orders

## 2017-02-01 ENCOUNTER — Telehealth (HOSPITAL_COMMUNITY): Payer: Self-pay

## 2017-02-01 NOTE — Telephone Encounter (Signed)
I called patient to discuss scheduling for cardiac rehab. Patient stated that she was not interested and gets around fine. Referral closed.

## 2017-02-04 ENCOUNTER — Other Ambulatory Visit: Payer: Self-pay | Admitting: Family Medicine

## 2017-02-04 DIAGNOSIS — R7989 Other specified abnormal findings of blood chemistry: Secondary | ICD-10-CM

## 2017-02-04 DIAGNOSIS — R609 Edema, unspecified: Secondary | ICD-10-CM

## 2017-02-04 NOTE — Telephone Encounter (Signed)
Is this okay to refill? 

## 2017-02-04 NOTE — Telephone Encounter (Signed)
Pt will call cardiologist about med and then let us know

## 2017-02-04 NOTE — Telephone Encounter (Signed)
She has been under the care of her cardiologist lately. Have her check with them just to be sure of correct dose and medication

## 2017-02-11 ENCOUNTER — Other Ambulatory Visit: Payer: Self-pay | Admitting: Physician Assistant

## 2017-02-13 ENCOUNTER — Other Ambulatory Visit: Payer: Self-pay | Admitting: Physician Assistant

## 2017-02-17 NOTE — Progress Notes (Signed)
Cardiology Office Note    Date:  02/18/2017   ID:  Michelle [name], DOB [birth year], MRN [id number]  PCP:  Girtha Rm, NP-C  Cardiologist: Previously Dr. Acie Fredrickson --> Wishes to Follow with Dr. Stanford Breed  Chief Complaint  Patient presents with  . Follow-up    3 months    History of Present Illness:    Michelle [name] is a 56 y.o. female with past medical history of HTN, HLD, and CVA who presents to the office today for hospital follow-up.  She was evaluated by Robbie Lis, PA-C in 10/2016 for exertional chest pain and an NST was ordered for initial evaluation which came back as showing inferior ischemia. Therefore, a cardiac catheterization was recommended which was performed on 12/12/2016 and showed severe 2-vessel CAD with both lesions being at significant bifurications with 60% mid-RCA stenosis, 95% RPDA lesion, 95% Dist RCA lesion, 95% Post Atrio lesion, 80% mid-LAD, and 75% Ost 2nd Diag. CT Surgery was consulted and she underwent CABG on 12/17/2016 with LIMA-LAD, SVG-D1, and Seq SVG-PDA-PL. Her post-operative course was complicated by acute delirium which was followed closely by Psychiatry. She was discharged on 12/23/2016 and followed-up with Dr. Cyndia Bent in the outpatient setting in 12/2016 and was progressing well at that time with plans to start cardiac rehab.   In talking with the patient today, she reports overall doing well since her recent bypass surgery. She reports her sternal pain has resolved and she denies any chest pain or dyspnea on exertion. She walks daily around her neighborhood daily and denies any symptoms with this. Due to her activity level being stable, she did not participate in cardiac rehab and is not interested in doing so at this time. No recent orthopnea, PND, lower extremity edema, or palpitations. She did stop Lasix approximately 2 weeks ago due to running out of the medication and has continued on potassium since.   She had been prescribed Atorvastatin 40 mg  daily but reports significant leg cramps when taking this. Has not tried taking an alternative medication in the past.   Past Medical History:  Diagnosis Date  . ABDOMINAL PAIN RIGHT LOWER QUADRANT 07/26/2009  . Abdominal pain, left lower quadrant 07/26/2009  . ABDOMINAL PAIN, LOWER 11/29/2009  . ANEMIA-B12 DEFICIENCY 07/11/2009  . ANEMIA-IRON DEFICIENCY 06/21/2009  . ANXIETY 02/06/2007  . Arthritis    "back" (12/12/2016)  . BACK PAIN 07/01/2007  . Benzodiazepine overdose 12/03/2011   July 2013  . CAD (coronary artery disease)    a. LHC 12/12/16: Multivessel CAD b. s/p CABG on 12/17/2016 with LIMA-LAD, SVG-D1, and Seq SVG-PDA-PL  . CHEST PAIN 06/21/2009  . DEPRESSION 02/06/2007  . [place] DISEASE, CERVICAL 05/03/2009  . DYSPEPSIA 10/23/2007  . EAR PAIN, RIGHT 11/29/2009  . ELBOW PAIN, RIGHT 06/27/2010  . ELEVATED BLOOD PRESSURE WITHOUT DIAGNOSIS OF HYPERTENSION 07/01/2007  . FEVER UNSPECIFIED 06/03/2008  . FREQUENCY, URINARY 03/12/2007  . Genital herpes 01/03/2011  . GERD 07/11/2009  . Glossitis 10/23/2007  . HEAD TRAUMA, CLOSED 11/29/2009  . History of surgery for cerebral aneurysm 2015  . HYPERLIPIDEMIA 02/06/2007  . Insomnia, unspecified 02/06/2007  . LATERAL EPICONDYLITIS, LEFT 06/03/2008  . LOW BACK PAIN 02/06/2007  . Major depression 11/16/2010  . MIGRAINE HEADACHE 02/06/2007   "used to get them all the time; stopped a few years ago" (12/12/2016)  . OSTEOARTHRITIS 02/06/2007  . 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 OTITIS EXTERNA, RIGHT 10/04/2009  . Stroke [facility]) 2016-2017 X 3   RLE "goes to the  side a little bit when I walk" (12/12/2016)  . VITAMIN B12 DEFICIENCY 07/26/2009    Past Surgical History:  Procedure Laterality Date  . ABDOMINAL HYSTERECTOMY  2005   "still have my ovaries"  . ANKLE FRACTURE SURGERY Right ?1991  . ANTERIOR CERVICAL DECOMP/DISCECTOMY FUSION  2008   C5-6  . APPENDECTOMY  04/2001   lap appy/notes 10/11/2010  . BACK SURGERY    . CARDIAC CATHETERIZATION  12/12/2016  .  CEREBRAL ANEURYSM REPAIR    . CORONARY ARTERY BYPASS GRAFT N/A 12/17/2016   Procedure: CORONARY ARTERY BYPASS GRAFTING (CABG) x 4, using left internal mammary artery and right greater saphenous vein harvested endoscopically;  Surgeon: Gaye Pollack, MD;  Location: [facility] OR;  Service: Open Heart Surgery;  Laterality: N/A;  . CYSTOSCOPY W/ URETEROSCOPY  02/17/2002    Cystoscopy, right retrograde pyelogram with interpretation, left ureteroscopy with holmium and right ureteroscopy/notes 10/11/2010  . INTRAOPERATIVE TRANSESOPHAGEAL ECHOCARDIOGRAM N/A 12/17/2016   Procedure: INTRAOPERATIVE TRANSESOPHAGEAL ECHOCARDIOGRAM;  Surgeon: Gaye Pollack, MD;  Location: [facility] OR;  Service: Open Heart Surgery;  Laterality: N/A;  . LEFT HEART CATH AND CORONARY ANGIOGRAPHY N/A 12/12/2016   Procedure: Left Heart Cath and Coronary Angiography;  Surgeon: Jettie Booze, MD;  Location: [facility] LAB;  Service: Cardiovascular;  Laterality: N/A;  . TUBAL LIGATION      Current Medications: Outpatient Medications Prior to Visit  Medication Sig Dispense Refill  . aspirin 325 MG EC tablet Take 1 tablet (325 mg total) by mouth daily. 30 tablet 0  . gabapentin (NEURONTIN) 300 MG capsule Take 300 mg by mouth 3 (three) times daily.    . metoprolol tartrate (LOPRESSOR) 25 MG tablet Take 0.5 tablets (12.5 mg total) by mouth 2 (two) times daily. 60 tablet 1  . Multiple Vitamin (MULTIVITAMIN WITH MINERALS) TABS tablet Take 1 tablet by mouth daily.    Marland Kitchen PARoxetine (PAXIL) 40 MG tablet Take 40 mg by mouth daily.  2  . polyethylene glycol powder (GLYCOLAX/MIRALAX) powder Take 17 g by mouth 2 (two) times daily as needed. (Patient taking differently: Take 17 g by mouth daily. ) 3350 g 1  . potassium chloride (K-DUR) 10 MEQ tablet Take 1 tablet (10 mEq total) by mouth daily. 30 tablet 1  . ranitidine (ZANTAC) 150 MG tablet Take 1 tablet (150 mg total) by mouth 2 (two) times daily. 30 tablet 1  . traMADol (ULTRAM) 50 MG tablet Take  1 tablet (50 mg total) by mouth every 6 (six) hours as needed for moderate pain. 30 tablet 0  . furosemide (LASIX) 20 MG tablet TAKE 1 TABLET BY MOUTH EVERY DAY (Patient not taking: Reported on 02/18/2017) 30 tablet 1  . atorvastatin (LIPITOR) 40 MG tablet Take 1 tablet (40 mg total) by mouth daily. 30 tablet 3   No facility-administered medications prior to visit.      Allergies:   Citalopram; Diclofenac sodium; Fluoxetine hcl; Ibuprofen; and Lamictal [lamotrigine]   Social History   Social History  . Marital status: Widowed    Spouse name: N/A  . Number of children: N/A  . Years of education: N/A   Occupational History  . 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 Smoking status: Former Smoker    Packs/day: 1.50    Years: 42.00    Types: Cigarettes    Quit date: 11/14/2016  . Smokeless tobacco: Never Used  . Alcohol use Yes     Comment: 12/12/2016 "  2 drinks per month"  . Drug use: No  . Sexual activity: No   Other Topics Concern  . None   Social History Narrative   Currently living apart from husband    Family History:  The patient's family history includes Cancer - Lung in her father; Cirrhosis in her brother; Coronary artery disease (age of onset: 39) in her other; Diabetes in her brother and other; Hypertension in her other.   Review of Systems:   Please see the history of present illness.     General:  No chills, fever, night sweats or weight changes. Positive for leg cramps.  Cardiovascular:  No chest pain, dyspnea on exertion, edema, orthopnea, palpitations, paroxysmal nocturnal dyspnea. Dermatological: No rash, lesions/masses Respiratory: No cough, dyspnea Urologic: No hematuria, dysuria Abdominal:   No nausea, vomiting, diarrhea, bright red blood per rectum, melena, or hematemesis Neurologic:  No visual changes, wkns, changes in mental status. All other systems reviewed and are otherwise negative except  as noted above.   Physical Exam:    VS:  BP 92/65 (BP Location: Right Arm)   Pulse 84   Ht 5\' 3"  (1.6 m)   Wt 147 lb 3.2 oz (66.8 kg)   BMI 26.08 kg/m    General: Well developed, well nourished Caucasian female appearing in no acute distress. Head: Normocephalic, atraumatic, sclera non-icteric, no xanthomas, nares are without discharge.  Neck: No carotid bruits. JVD not elevated.  Lungs: Respirations regular and unlabored, without wheezes or rales.  Heart: Regular rate and rhythm. No S3 or S4.  No murmur, no rubs, or gallops appreciated. Sternal wound appears well-healing, without erythema or drainage.  Abdomen: Soft, non-tender, non-distended with normoactive bowel sounds. No hepatomegaly. No rebound/guarding. No obvious abdominal masses. Msk:  Strength and tone appear normal for age. No joint deformities or effusions. Extremities: No clubbing or cyanosis. No lower extremity edema.  Distal pedal pulses are 2+ bilaterally. Neuro: Alert and oriented X 3. Moves all extremities spontaneously. No focal deficits noted. Psych:  Responds to questions appropriately with a normal affect. Skin: No rashes or lesions noted  Wt Readings from Last 3 Encounters:  02/18/17 147 lb 3.2 oz (66.8 kg)  01/23/17 148 lb (67.1 kg)  12/23/16 149 lb 12.8 oz (67.9 kg)     Studies/Labs Reviewed:   EKG:  EKG is not ordered today.   Recent Labs: 10/16/2016: Brain Natriuretic Peptide 369.6 12/05/2016: TSH 4.65 12/20/2016: Hemoglobin 8.5; Platelets 198 12/21/2016: ALT 36; BUN 14; Creatinine, Ser 0.71; Magnesium 1.9; Potassium 4.0; Sodium 141   Lipid Panel    Component Value Date/Time   CHOL 173 12/14/2016 0240   TRIG 173 (H) 12/14/2016 0240   HDL 51 12/14/2016 0240   CHOLHDL 3.4 12/14/2016 0240   VLDL 35 12/14/2016 0240   LDLCALC 87 12/14/2016 0240   LDLDIRECT 189.4 06/27/2010 0940    Additional studies/ records that were reviewed today include:   Cardiac Catheterization: 12/14/2016  Mid RCA  lesion, 60 %stenosed.  RPDA lesion, 95 %stenosed.  Dist RCA lesion, 95 %stenosed.  Post Atrio lesion, 95 %stenosed.  Mid LAD lesion, 80 %stenosed.  Ost 2nd Diag to 2nd Diag lesion, 75 %stenosed.  The left ventricular systolic function is normal.  LV end diastolic pressure is normal.  The left ventricular ejection fraction is 55-65% by visual estimate.  There is no aortic valve stenosis.   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   It is reasonable to consider CABG given the bifurcation  disease, although there are percutaneous options as well.  I think a consult is warranted.  She has had some rest pain.  Will admit to inpatient service.  Start heparin 6 hours post sheath pull.   Echocardiogram: 11/2016 Study Conclusions  - Left ventricle: The cavity size was normal. There was mild focal   basal hypertrophy of the septum. Systolic function was normal.   The estimated ejection fraction was in the range of 60% to 65%.   Wall motion was normal; there were no regional wall motion   abnormalities. Doppler parameters are consistent with abnormal   left ventricular relaxation (grade 1 diastolic dysfunction). - Mitral valve: There was trivial regurgitation. - Tricuspid valve: There was trivial regurgitation.  Assessment:    1. Coronary artery disease involving native coronary artery of native heart without angina pectoris   2. S/P CABG x 4   3. Essential hypertension   4. Hyperlipidemia LDL goal <70   5. Medication management      Plan:   In order of problems listed above:  1. CAD/ Status-post CABG - cardiac catheterization in 11/2016 showed severe 2-vessel CAD with both lesions being at significant bifurications, therefore CT Surgery was consulted and she underwent CABG on 12/17/2016 with LIMA-LAD, SVG-D1, and Seq SVG-PDA-PL. Unclear why she has not followed up with Cardiology since. Was evaluated by Dr. Cyndia Bent in 12/2016 and doing  well at that time.  - she denies any recent chest pain or dyspnea on exertion. Ambulates around her neighborhood daily without exertional symptoms.  - will continue on ASA, BB, and statin therapy. She self-discontinued Lasix 2 weeks ago due to running out of the medication and does not appear volume overloaded by physical examination. EF was preserved at 60-65% by echo in 11/2016. Sodium and fluid restriction was reviewed with the patient. She has continued to take K+ despite stopping Lasix, therefore will check K+ level today.   2. HTN - BP is soft at 92/65 during today's visit. She denies any current symptoms. Does not check BP regularly at home. - will continue Lopressor 12.5mg  BID. She plans to purchase a BP cuff and will report back if BP remains low.   3. HLD - Lipid Panel in 11/2016 showed total cholesterol of 173, HDL 51, and LDL 87. Started on Atorvastatin 40 mg daily at that time but reports significant leg cramps when taking this. Will reduce dosing to 20mg  daily. If this does not help, consider switching to Crestor 20mg  daily. Recheck FLP and LFT's today.    Medication Adjustments/Labs and Tests Ordered: Current medicines are reviewed at length with the patient today.  Concerns regarding medicines are outlined above.  Medication changes, Labs and Tests ordered today are listed in the Patient Instructions below. Patient Instructions  Medication Instructions:  STOP  Lasix STOP Potassium START Lipitor 20 mg by mouth daily  Labwork: Fasting lipid panel CMET  Testing/Procedures: None today  Follow-Up: 3 months with Dr. Stanford Breed  If you need a refill on your cardiac medications before your next appointment, please call your pharmacy.   Signed, Erma Heritage, PA-C  02/18/2017 4:34 PM    [facility] [place], [place], [place]  [address] Phone: [phone]; Fax: [phone]  [address] [place], [place]  [address] Phone: [phone]

## 2017-02-18 ENCOUNTER — Encounter: Payer: Self-pay | Admitting: Student

## 2017-02-18 ENCOUNTER — Ambulatory Visit (INDEPENDENT_AMBULATORY_CARE_PROVIDER_SITE_OTHER): Payer: BLUE CROSS/BLUE SHIELD | Admitting: Student

## 2017-02-18 VITALS — BP 92/65 | HR 84 | Ht 63.0 in | Wt 147.2 lb

## 2017-02-18 DIAGNOSIS — Z79899 Other long term (current) drug therapy: Secondary | ICD-10-CM | POA: Diagnosis not present

## 2017-02-18 DIAGNOSIS — I10 Essential (primary) hypertension: Secondary | ICD-10-CM

## 2017-02-18 DIAGNOSIS — E785 Hyperlipidemia, unspecified: Secondary | ICD-10-CM

## 2017-02-18 DIAGNOSIS — I2510 Atherosclerotic heart disease of native coronary artery without angina pectoris: Secondary | ICD-10-CM | POA: Insufficient documentation

## 2017-02-18 DIAGNOSIS — Z951 Presence of aortocoronary bypass graft: Secondary | ICD-10-CM | POA: Diagnosis not present

## 2017-02-18 LAB — COMPREHENSIVE METABOLIC PANEL
A/G RATIO: 1.6 (ref 1.2–2.2)
ALK PHOS: 157 IU/L — AB (ref 39–117)
ALT: 18 IU/L (ref 0–32)
AST: 25 IU/L (ref 0–40)
Albumin: 4 g/dL (ref 3.5–5.5)
BILIRUBIN TOTAL: 0.3 mg/dL (ref 0.0–1.2)
BUN/Creatinine Ratio: 22 (ref 9–23)
BUN: 14 mg/dL (ref 6–24)
CHLORIDE: 107 mmol/L — AB (ref 96–106)
CO2: 21 mmol/L (ref 20–29)
Calcium: 9.2 mg/dL (ref 8.7–10.2)
Creatinine, Ser: 0.65 mg/dL (ref 0.57–1.00)
GFR calc Af Amer: 114 mL/min/{1.73_m2} (ref >59)
GFR, EST NON AFRICAN AMERICAN: 99 mL/min/{1.73_m2} (ref >59)
GLOBULIN, TOTAL: 2.5 g/dL (ref 1.5–4.5)
Glucose: 92 mg/dL (ref 65–99)
POTASSIUM: 4.1 mmol/L (ref 3.5–5.2)
SODIUM: 141 mmol/L (ref 134–144)
Total Protein: 6.5 g/dL (ref 6.0–8.5)

## 2017-02-18 LAB — LIPID PANEL
CHOL/HDL RATIO: 3 ratio (ref 0.0–4.4)
CHOLESTEROL TOTAL: 127 mg/dL (ref 100–199)
HDL: 43 mg/dL (ref >39)
LDL CALC: 47 mg/dL (ref 0–99)
TRIGLYCERIDES: 184 mg/dL — AB (ref 0–149)
VLDL CHOLESTEROL CAL: 37 mg/dL (ref 5–40)

## 2017-02-18 MED ORDER — ATORVASTATIN CALCIUM 20 MG PO TABS: 20.0000 mg | ORAL_TABLET | Freq: Every day | ORAL | 3 refills | Status: DC

## 2017-02-18 NOTE — Patient Instructions (Addendum)
Medication Instructions:  STOP  Lasix STOP Potassium START Lipitor 20 mg by mouth daily  Labwork: Fasting lipid panel CMET  Testing/Procedures: None today  Follow-Up: 3 months with Dr. Stanford Breed   If you need a refill on your cardiac medications before your next appointment, please call your pharmacy.

## 2017-02-28 ENCOUNTER — Encounter: Payer: Self-pay | Admitting: *Deleted

## 2017-04-19 ENCOUNTER — Other Ambulatory Visit: Payer: Self-pay | Admitting: Physician Assistant

## 2017-05-13 NOTE — Progress Notes (Signed)
HPI: Fu CAD. Echo 7/18 showed normal LV function, grade 1 DD. Cardiac cath 7/18 showed severe 2-vessel CAD. Preop carotid dopplers showed 1-39 bilateral stenosis. Pt had CABG on 12/17/2016 with LIMA-LAD, SVG-D1, and Seq SVG-PDA-PL. Her post-operative course was complicated by acute delirium which was followed closely by Psychiatry. Since last seen, she has mild dyspnea on exertion but no orthopnea or PND. No chest pain or syncope. She occasionally has mild pedal ede.  Current Outpatient Medications  Medication Sig Dispense Refill  . aspirin 325 MG EC tablet Take 1 tablet (325 mg total) by mouth daily. 30 tablet 0  . gabapentin (NEURONTIN) 300 MG capsule Take 300 mg by mouth 3 (three) times daily.    . metoprolol tartrate (LOPRESSOR) 25 MG tablet TAKE 0.5 TABLETS BY MOUTH TWO TIMES DAILY. 60 tablet 1  . Multiple Vitamin (MULTIVITAMIN WITH MINERALS) TABS tablet Take 1 tablet by mouth daily.    Marland Kitchen PARoxetine (PAXIL) 40 MG tablet Take 40 mg by mouth daily.  2  . polyethylene glycol powder (GLYCOLAX/MIRALAX) powder Take 17 g by mouth 2 (two) times daily as needed. (Patient taking differently: Take 17 g by mouth daily. ) 3350 g 1  . QUEtiapine (SEROQUEL) 200 MG tablet Take 200 mg by mouth at bedtime.    . ranitidine (ZANTAC) 150 MG tablet Take 1 tablet (150 mg total) by mouth 2 (two) times daily. 30 tablet 1  . atorvastatin (LIPITOR) 20 MG tablet Take 1 tablet (20 mg total) by mouth daily. 90 tablet 3  . furosemide (LASIX) 20 MG tablet TAKE 1 TABLET BY MOUTH EVERY DAY (Patient not taking: Reported on 05/23/2017) 30 tablet 1  . potassium chloride (K-DUR) 10 MEQ tablet Take 1 tablet (10 mEq total) by mouth daily. (Patient not taking: Reported on 05/23/2017) 30 tablet 1  . traMADol (ULTRAM) 50 MG tablet Take 1 tablet (50 mg total) by mouth every 6 (six) hours as needed for moderate pain. (Patient not taking: Reported on 05/23/2017) 30 tablet 0   No current facility-administered medications for this  visit.      Past Medical History:  Diagnosis Date  . ABDOMINAL PAIN RIGHT LOWER QUADRANT 07/26/2009  . Abdominal pain, left lower quadrant 07/26/2009  . ABDOMINAL PAIN, LOWER 11/29/2009  . ANEMIA-B12 DEFICIENCY 07/11/2009  . ANEMIA-IRON DEFICIENCY 06/21/2009  . ANXIETY 02/06/2007  . Arthritis    "back" (12/12/2016)  . BACK PAIN 07/01/2007  . Benzodiazepine overdose 12/03/2011   July 2013  . CAD (coronary artery disease)    a. LHC 12/12/16: Multivessel CAD b. s/p CABG on 12/17/2016 with LIMA-LAD, SVG-D1, and Seq SVG-PDA-PL  . CHEST PAIN 06/21/2009  . DEPRESSION 02/06/2007  . [place] DISEASE, CERVICAL 05/03/2009  . DYSPEPSIA 10/23/2007  . EAR PAIN, RIGHT 11/29/2009  . ELBOW PAIN, RIGHT 06/27/2010  . ELEVATED BLOOD PRESSURE WITHOUT DIAGNOSIS OF HYPERTENSION 07/01/2007  . FEVER UNSPECIFIED 06/03/2008  . FREQUENCY, URINARY 03/12/2007  . Genital herpes 01/03/2011  . GERD 07/11/2009  . Glossitis 10/23/2007  . HEAD TRAUMA, CLOSED 11/29/2009  . History of surgery for cerebral aneurysm 2015  . HYPERLIPIDEMIA 02/06/2007  . Insomnia, unspecified 02/06/2007  . LATERAL EPICONDYLITIS, LEFT 06/03/2008  . LOW BACK PAIN 02/06/2007  . Major depression 11/16/2010  . MIGRAINE HEADACHE 02/06/2007   "used to get them all the time; stopped a few years ago" (12/12/2016)  . OSTEOARTHRITIS 02/06/2007  . 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 OTITIS EXTERNA, RIGHT 10/04/2009  . Stroke ([place]) 2016-2017 X 3  RLE "goes to the side a little bit when I walk" (12/12/2016)  . VITAMIN B12 DEFICIENCY 07/26/2009    Past Surgical History:  Procedure Laterality Date  . ABDOMINAL HYSTERECTOMY  2005   "still have my ovaries"  . ANKLE FRACTURE SURGERY Right ?1991  . ANTERIOR CERVICAL DECOMP/DISCECTOMY FUSION  2008   C5-6  . APPENDECTOMY  04/2001   lap appy/notes 10/11/2010  . BACK SURGERY    . CARDIAC CATHETERIZATION  12/12/2016  . CEREBRAL ANEURYSM REPAIR    . CORONARY ARTERY BYPASS GRAFT N/A 12/17/2016   Procedure: CORONARY ARTERY BYPASS  GRAFTING (CABG) x 4, using left internal mammary artery and right greater saphenous vein harvested endoscopically;  Surgeon: Gaye Pollack, MD;  Location: [place] OR;  Service: Open Heart Surgery;  Laterality: N/A;  . CYSTOSCOPY W/ URETEROSCOPY  02/17/2002    Cystoscopy, right retrograde pyelogram with interpretation, left ureteroscopy with holmium and right ureteroscopy/notes 10/11/2010  . INTRAOPERATIVE TRANSESOPHAGEAL ECHOCARDIOGRAM N/A 12/17/2016   Procedure: INTRAOPERATIVE TRANSESOPHAGEAL ECHOCARDIOGRAM;  Surgeon: Gaye Pollack, MD;  Location: [facility] OR;  Service: Open Heart Surgery;  Laterality: N/A;  . LEFT HEART CATH AND CORONARY ANGIOGRAPHY N/A 12/12/2016   Procedure: Left Heart Cath and Coronary Angiography;  Surgeon: Jettie Booze, MD;  Location: [place] [facility];  Service: Cardiovascular;  Laterality: N/A;  . TUBAL LIGATION      Social History   Socioeconomic History  . Marital status: Widowed    Spouse name: Not on file  . Number of children: Not on file  . Years of education: Not on file  . Highest education level: Not on file  Social Needs  . Financial resource strain: Not on file  . Food insecurity - worry: Not on file  . Food insecurity - inability: Not on file  . Transportation needs - medical: Not on file  . Transportation needs - non-medical: Not on file  Occupational History  . 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 Smoking status: Former Smoker    Packs/day: 1.50    Years: 42.00    Pack years: 63.00    Types: Cigarettes    Last attempt to quit: 11/14/2016    Years since quitting: 0.5  . Smokeless tobacco: Never Used  Substance and Sexual Activity  . Alcohol use: Yes    Comment: 12/12/2016 "2 drinks per month"  . Drug use: No  . Sexual activity: No  Other Topics Concern  . Not on file  Social History Narrative   Currently living apart from husband    Family History  Problem Relation  Age of Onset  . Diabetes Other   . Hypertension Other   . Coronary artery disease Other 46       Female 1st degree relative  . Cancer - Lung Father   . Cirrhosis Brother   . Diabetes Brother     ROS: some foot pain but no fevers or chills, productive cough, hemoptysis, dysphasia, odynophagia, melena, hematochezia, dysuria, hematuria, rash, seizure activity, orthopnea, PND, claudication. Remaining systems are negative.  Physical Exam: Well-developed well-nourished in no acute distress.  Skin is warm and dry.  HEENT is normal.  Neck is supple.  Chest is clear to auscultation with normal expansion.  Cardiovascular exam is regular rate and rhythm.  Abdominal exam nontender or distended. No masses palpated. Extremities show no edema. neuro grossly intact   A/P  1 coronary artery disease-status post coronary artery bypassing  graft. Patient is doing well with no chest pain. We will plan to continue with medical therapy including aspirin and statin.  2 hypertension-blood pressure is controlled. Continue present medications.  3 hyperlipidemia-continue statin. She did not tolerate higher doseLipitor.  Kirk Ruths, MD

## 2017-05-23 ENCOUNTER — Encounter: Payer: Self-pay | Admitting: Cardiology

## 2017-05-23 ENCOUNTER — Ambulatory Visit: Payer: BLUE CROSS/BLUE SHIELD | Admitting: Cardiology

## 2017-05-23 VITALS — BP 124/82 | HR 85 | Ht 62.0 in | Wt 140.0 lb

## 2017-05-23 DIAGNOSIS — E7800 Pure hypercholesterolemia, unspecified: Secondary | ICD-10-CM

## 2017-05-23 DIAGNOSIS — I2510 Atherosclerotic heart disease of native coronary artery without angina pectoris: Secondary | ICD-10-CM

## 2017-05-23 DIAGNOSIS — I10 Essential (primary) hypertension: Secondary | ICD-10-CM | POA: Diagnosis not present

## 2017-05-23 NOTE — Patient Instructions (Signed)
Your physician recommends that you schedule a follow-up appointment in: 6 MONTHS WITH DR CRENSHAW  If you need a refill on your cardiac medications before your next appointment, please call your pharmacy.   

## 2017-11-13 NOTE — Progress Notes (Deleted)
HPI: Fu CAD. Echo 7/18 showed normal LV function, grade 1 DD. Cardiac cath 7/18 showed severe 2-vessel CAD. Preop carotid dopplers showed 1-39 bilateral stenosis. Pt had CABG on 12/17/2016 with LIMA-LAD, SVG-D1, and Seq SVG-PDA-PL. Her post-operative course was complicated by acute delirium which was followed closely by Psychiatry. Since last seen,   Current Outpatient Medications  Medication Sig Dispense Refill  . aspirin 325 MG EC tablet Take 1 tablet (325 mg total) by mouth daily. 30 tablet 0  . atorvastatin (LIPITOR) 20 MG tablet Take 1 tablet (20 mg total) by mouth daily. 90 tablet 3  . furosemide (LASIX) 20 MG tablet TAKE 1 TABLET BY MOUTH EVERY DAY (Patient not taking: Reported on 05/23/2017) 30 tablet 1  . gabapentin (NEURONTIN) 300 MG capsule Take 300 mg by mouth 3 (three) times daily.    . metoprolol tartrate (LOPRESSOR) 25 MG tablet TAKE 0.5 TABLETS BY MOUTH TWO TIMES DAILY. 60 tablet 1  . Multiple Vitamin (MULTIVITAMIN WITH MINERALS) TABS tablet Take 1 tablet by mouth daily.    Marland Kitchen PARoxetine (PAXIL) 40 MG tablet Take 40 mg by mouth daily.  2  . polyethylene glycol powder (GLYCOLAX/MIRALAX) powder Take 17 g by mouth 2 (two) times daily as needed. (Patient taking differently: Take 17 g by mouth daily. ) 3350 g 1  . potassium chloride (K-DUR) 10 MEQ tablet Take 1 tablet (10 mEq total) by mouth daily. (Patient not taking: Reported on 05/23/2017) 30 tablet 1  . QUEtiapine (SEROQUEL) 200 MG tablet Take 200 mg by mouth at bedtime.    . ranitidine (ZANTAC) 150 MG tablet Take 1 tablet (150 mg total) by mouth 2 (two) times daily. 30 tablet 1  . traMADol (ULTRAM) 50 MG tablet Take 1 tablet (50 mg total) by mouth every 6 (six) hours as needed for moderate pain. (Patient not taking: Reported on 05/23/2017) 30 tablet 0   No current facility-administered medications for this visit.      Past Medical History:  Diagnosis Date  . ABDOMINAL PAIN RIGHT LOWER QUADRANT 07/26/2009  . Abdominal  pain, left lower quadrant 07/26/2009  . ABDOMINAL PAIN, LOWER 11/29/2009  . ANEMIA-B12 DEFICIENCY 07/11/2009  . ANEMIA-IRON DEFICIENCY 06/21/2009  . ANXIETY 02/06/2007  . Arthritis    "back" (12/12/2016)  . BACK PAIN 07/01/2007  . Benzodiazepine overdose 12/03/2011   July 2013  . CAD (coronary artery disease)    a. LHC 12/12/16: Multivessel CAD b. s/p CABG on 12/17/2016 with LIMA-LAD, SVG-D1, and Seq SVG-PDA-PL  . CHEST PAIN 06/21/2009  . DEPRESSION 02/06/2007  . [place] DISEASE, CERVICAL 05/03/2009  . DYSPEPSIA 10/23/2007  . EAR PAIN, RIGHT 11/29/2009  . ELBOW PAIN, RIGHT 06/27/2010  . ELEVATED BLOOD PRESSURE WITHOUT DIAGNOSIS OF HYPERTENSION 07/01/2007  . FEVER UNSPECIFIED 06/03/2008  . FREQUENCY, URINARY 03/12/2007  . Genital herpes 01/03/2011  . GERD 07/11/2009  . Glossitis 10/23/2007  . HEAD TRAUMA, CLOSED 11/29/2009  . History of surgery for cerebral aneurysm 2015  . HYPERLIPIDEMIA 02/06/2007  . Insomnia, unspecified 02/06/2007  . LATERAL EPICONDYLITIS, LEFT 06/03/2008  . LOW BACK PAIN 02/06/2007  . Major depression 11/16/2010  . MIGRAINE HEADACHE 02/06/2007   "used to get them all the time; stopped a few years ago" (12/12/2016)  . OSTEOARTHRITIS 02/06/2007  . 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 OTITIS EXTERNA, RIGHT 10/04/2009  . Stroke [facility]) 2016-2017 X 3   RLE "goes to the side a little bit when I walk" (12/12/2016)  . VITAMIN B12 DEFICIENCY 07/26/2009  Past Surgical History:  Procedure Laterality Date  . ABDOMINAL HYSTERECTOMY  2005   "still have my ovaries"  . ANKLE FRACTURE SURGERY Right ?1991  . ANTERIOR CERVICAL DECOMP/DISCECTOMY FUSION  2008   C5-6  . APPENDECTOMY  04/2001   lap appy/notes 10/11/2010  . BACK SURGERY    . CARDIAC CATHETERIZATION  12/12/2016  . CEREBRAL ANEURYSM REPAIR    . CORONARY ARTERY BYPASS GRAFT N/A 12/17/2016   Procedure: CORONARY ARTERY BYPASS GRAFTING (CABG) x 4, using left internal mammary artery and right greater saphenous vein harvested endoscopically;   Surgeon: Gaye Pollack, MD;  Location: [place] OR;  Service: Open Heart Surgery;  Laterality: N/A;  . CYSTOSCOPY W/ URETEROSCOPY  02/17/2002    Cystoscopy, right retrograde pyelogram with interpretation, left ureteroscopy with holmium and right ureteroscopy/notes 10/11/2010  . INTRAOPERATIVE TRANSESOPHAGEAL ECHOCARDIOGRAM N/A 12/17/2016   Procedure: INTRAOPERATIVE TRANSESOPHAGEAL ECHOCARDIOGRAM;  Surgeon: Gaye Pollack, MD;  Location: [facility] OR;  Service: Open Heart Surgery;  Laterality: N/A;  . LEFT HEART CATH AND CORONARY ANGIOGRAPHY N/A 12/12/2016   Procedure: Left Heart Cath and Coronary Angiography;  Surgeon: Jettie Booze, MD;  Location: [place] [facility];  Service: Cardiovascular;  Laterality: N/A;  . TUBAL LIGATION      Social History   Socioeconomic History  . Marital status: Widowed    Spouse name: Not on file  . Number of children: Not on file  . Years of education: Not on file  . Highest education level: Not on file  Occupational History  . 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 Financial resource strain: Not on file  . Food insecurity:    Worry: Not on file    Inability: Not on file  . Transportation needs:    Medical: Not on file    Non-medical: Not on file  Tobacco Use  . Smoking status: Former Smoker    Packs/day: 1.50    Years: 42.00    Pack years: 63.00    Types: Cigarettes    Last attempt to quit: 11/14/2016    Years since quitting: 0.9  . Smokeless tobacco: Never Used  Substance and Sexual Activity  . Alcohol use: Yes    Comment: 12/12/2016 "2 drinks per month"  . Drug use: No  . Sexual activity: Never  Lifestyle  . Physical activity:    Days per week: Not on file    Minutes per session: Not on file  . Stress: Not on file  Relationships  . Social connections:    Talks on phone: Not on file    Gets together: Not on file    Attends religious service: Not on file    Active member  of club or organization: Not on file    Attends meetings of clubs or organizations: Not on file    Relationship status: Not on file  . Intimate partner violence:    Fear of current or ex partner: Not on file    Emotionally abused: Not on file    Physically abused: Not on file    Forced sexual activity: Not on file  Other Topics Concern  . Not on file  Social History Narrative   Currently living apart from husband    Family History  Problem Relation Age of Onset  . Diabetes Other   . Hypertension Other   . Coronary artery disease Other 80       Female 1st degree relative  .  Cancer - Lung Father   . Cirrhosis Brother   . Diabetes Brother     ROS: no fevers or chills, productive cough, hemoptysis, dysphasia, odynophagia, melena, hematochezia, dysuria, hematuria, rash, seizure activity, orthopnea, PND, pedal edema, claudication. Remaining systems are negative.  Physical Exam: Well-developed well-nourished in no acute distress.  Skin is warm and dry.  HEENT is normal.  Neck is supple.  Chest is clear to auscultation with normal expansion.  Cardiovascular exam is regular rate and rhythm.  Abdominal exam nontender or distended. No masses palpated. Extremities show no edema. neuro grossly intact  ECG- personally reviewed  A/P  1 coronary artery disease status post coronary artery bypass and graft-patient continues to do well with no chest pain.  Continue medical therapy with aspirin and statin.  2 hypertension-blood pressure is controlled.  Continue present medications.  3 hyperlipidemia-continue statin.  Note she did not tolerate high-dose Lipitor previously.  Kirk Ruths, MD

## 2017-11-14 ENCOUNTER — Ambulatory Visit: Payer: Self-pay | Admitting: Cardiology

## 2017-11-15 ENCOUNTER — Encounter: Payer: Self-pay | Admitting: *Deleted

## 2017-12-09 ENCOUNTER — Other Ambulatory Visit: Payer: Self-pay | Admitting: Cardiology

## 2017-12-09 DIAGNOSIS — F4320 Adjustment disorder, unspecified: Secondary | ICD-10-CM | POA: Diagnosis not present

## 2017-12-09 DIAGNOSIS — F250 Schizoaffective disorder, bipolar type: Secondary | ICD-10-CM | POA: Diagnosis not present

## 2017-12-09 DIAGNOSIS — F5101 Primary insomnia: Secondary | ICD-10-CM | POA: Diagnosis not present

## 2017-12-09 DIAGNOSIS — F411 Generalized anxiety disorder: Secondary | ICD-10-CM | POA: Diagnosis not present

## 2017-12-09 MED ORDER — METOPROLOL TARTRATE 25 MG PO TABS: 12.5000 mg | ORAL_TABLET | Freq: Two times a day (BID) | ORAL | 2 refills | Status: DC

## 2017-12-09 MED ORDER — ATORVASTATIN CALCIUM 20 MG PO TABS: 20.0000 mg | ORAL_TABLET | Freq: Every day | ORAL | 2 refills | Status: DC

## 2017-12-09 NOTE — Telephone Encounter (Signed)
This is Dr. Crenshaw's pt 

## 2017-12-09 NOTE — Telephone Encounter (Signed)
Rx(s) sent to pharmacy electronically.  

## 2017-12-09 NOTE — Telephone Encounter (Signed)
New Message    *STAT* If patient is at the pharmacy, call can be transferred to refill team.   1. Which medications need to be refilled? (please list name of each medication and dose if known) atorvastatin (LIPITOR) 20 MG tablet(Expired) and metoprolol tartrate (LOPRESSOR) 25 MG tablet  2. Which pharmacy/location (including street and city if local pharmacy) is medication to be sent to?  3. Do they need a 30 day or 90 day supply? [place]

## 2017-12-26 DIAGNOSIS — F4320 Adjustment disorder, unspecified: Secondary | ICD-10-CM | POA: Diagnosis not present

## 2017-12-26 DIAGNOSIS — F250 Schizoaffective disorder, bipolar type: Secondary | ICD-10-CM | POA: Diagnosis not present

## 2017-12-26 DIAGNOSIS — F5101 Primary insomnia: Secondary | ICD-10-CM | POA: Diagnosis not present

## 2017-12-26 DIAGNOSIS — F411 Generalized anxiety disorder: Secondary | ICD-10-CM | POA: Diagnosis not present

## 2018-01-09 DIAGNOSIS — F5101 Primary insomnia: Secondary | ICD-10-CM | POA: Diagnosis not present

## 2018-01-09 DIAGNOSIS — F4320 Adjustment disorder, unspecified: Secondary | ICD-10-CM | POA: Diagnosis not present

## 2018-01-09 DIAGNOSIS — F250 Schizoaffective disorder, bipolar type: Secondary | ICD-10-CM | POA: Diagnosis not present

## 2018-01-09 DIAGNOSIS — F411 Generalized anxiety disorder: Secondary | ICD-10-CM | POA: Diagnosis not present

## 2018-01-11 IMAGING — DX DG CHEST 2V
2 series · 2 of 2 positions shown · non-contrast
Comparison: 12/19/2016 from[HOSPITAL].

CLINICAL DATA: Shortness of breath and bilateral lower extremity
pain. One month postop from CABG.

EXAM:
CHEST  2 VIEW

[dg chest 2 view (1 of 2)]
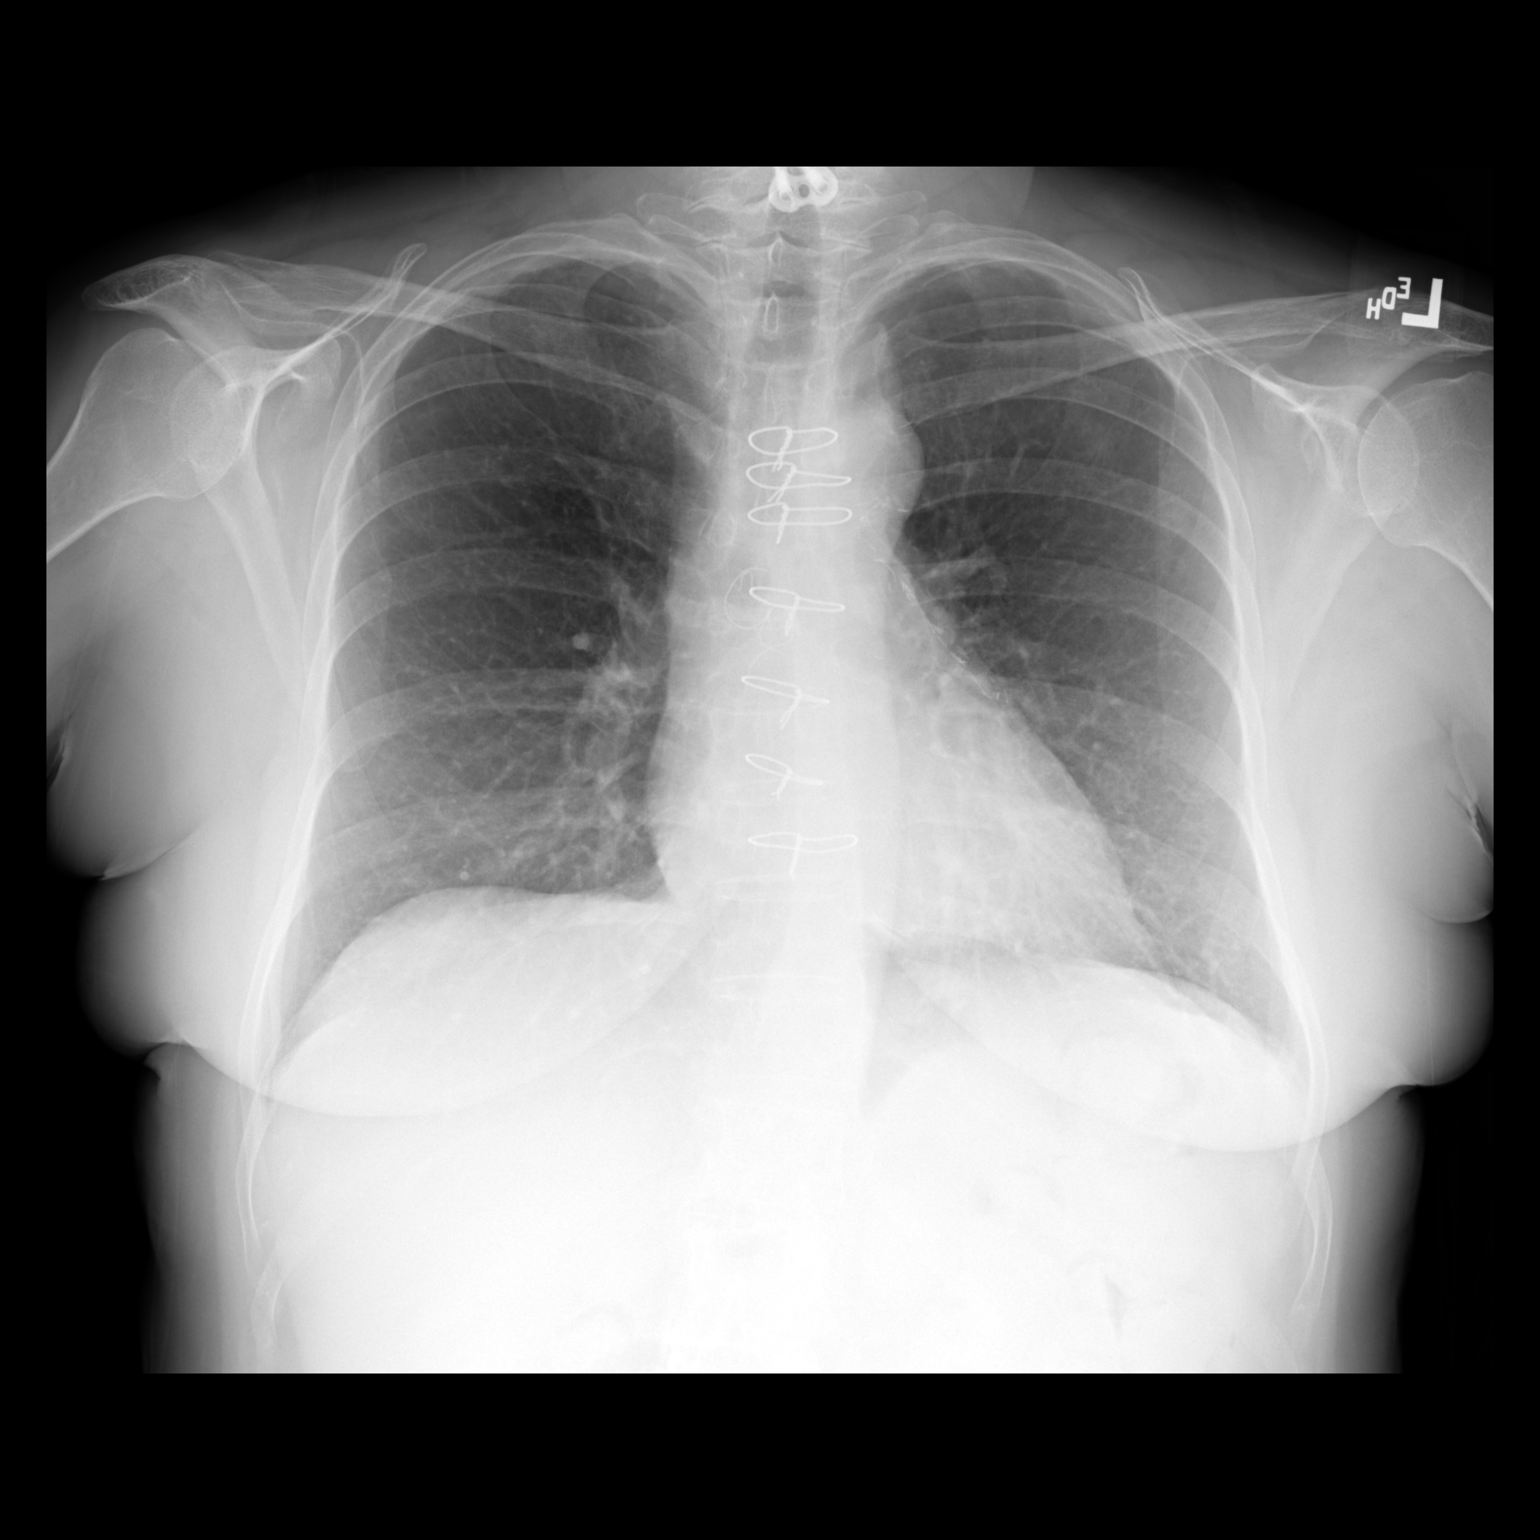

[dg chest 2 view (2 of 2)]
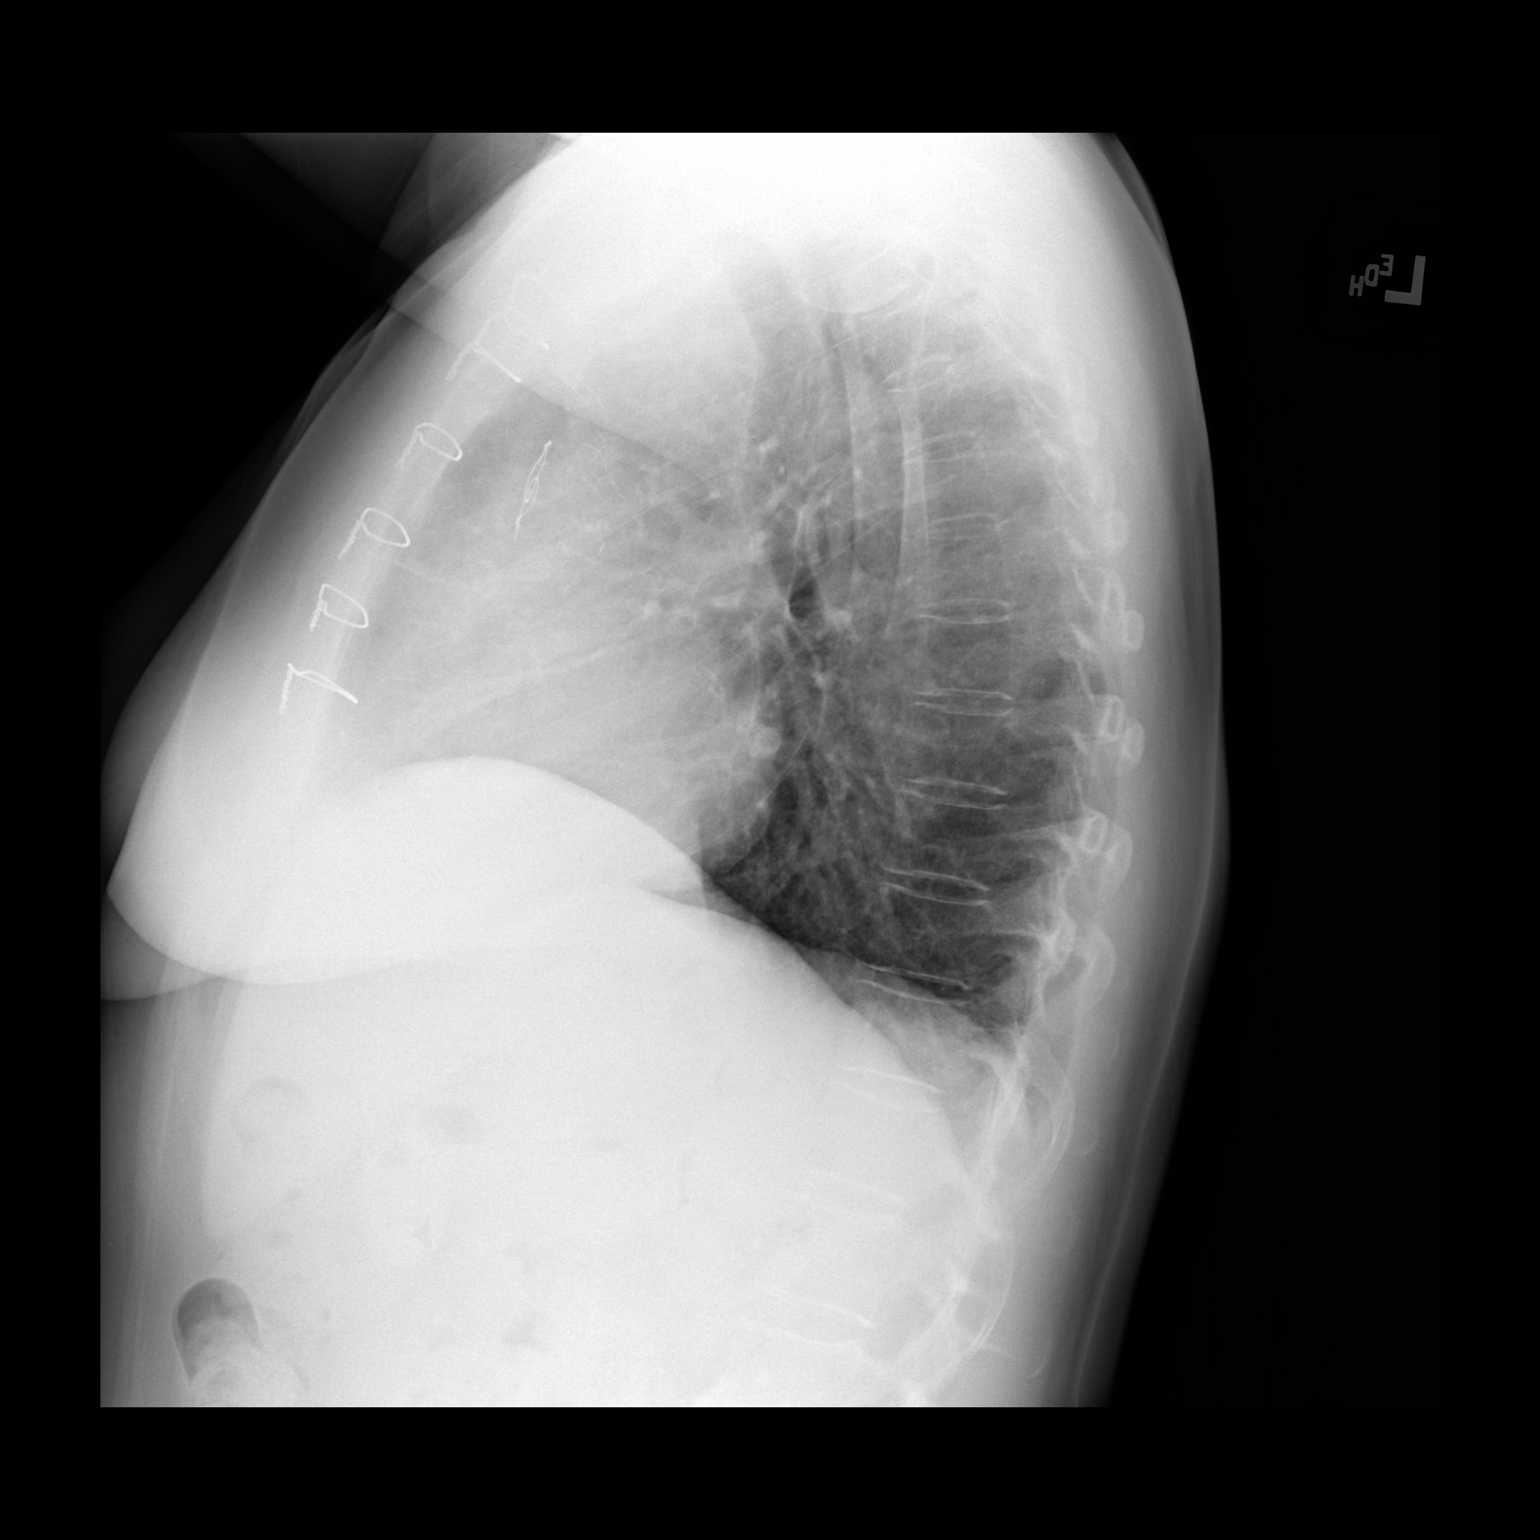

[2 of 2 positions shown; findings below may reference images not displayed]

FINDINGS: The heart size and mediastinal contours are within normal limits.
Prior CABG again noted. Both lungs are clear. No evidence of
pneumothorax or pleural effusion. Cervical spine fusion hardware
again noted.
IMPRESSION: No active cardiopulmonary disease.

## 2018-02-05 DIAGNOSIS — F411 Generalized anxiety disorder: Secondary | ICD-10-CM | POA: Diagnosis not present

## 2018-02-05 DIAGNOSIS — F5101 Primary insomnia: Secondary | ICD-10-CM | POA: Diagnosis not present

## 2018-02-05 DIAGNOSIS — F4312 Post-traumatic stress disorder, chronic: Secondary | ICD-10-CM | POA: Diagnosis not present

## 2018-02-05 DIAGNOSIS — F250 Schizoaffective disorder, bipolar type: Secondary | ICD-10-CM | POA: Diagnosis not present

## 2018-02-17 NOTE — Progress Notes (Deleted)
HPI: Fu CAD. Echo 7/18 showed normal LV function, grade 1 DD. Cardiac cath 7/18 showed severe 2-vessel CAD. Preop carotid dopplers showed 1-39 bilateral stenosis. Pt had CABG on 12/17/2016 with LIMA-LAD, SVG-D1, and Seq SVG-PDA-PL. Her post-operative course was complicated by acute delirium which was followed closely by Psychiatry. Since last seen,   Current Outpatient Medications  Medication Sig Dispense Refill  . aspirin 325 MG EC tablet Take 1 tablet (325 mg total) by mouth daily. 30 tablet 0  . atorvastatin (LIPITOR) 20 MG tablet Take 1 tablet (20 mg total) by mouth daily. 30 tablet 2  . furosemide (LASIX) 20 MG tablet TAKE 1 TABLET BY MOUTH EVERY DAY (Patient not taking: Reported on 05/23/2017) 30 tablet 1  . gabapentin (NEURONTIN) 300 MG capsule Take 300 mg by mouth 3 (three) times daily.    . metoprolol tartrate (LOPRESSOR) 25 MG tablet Take 0.5 tablets (12.5 mg total) by mouth 2 (two) times daily. 60 tablet 2  . Multiple Vitamin (MULTIVITAMIN WITH MINERALS) TABS tablet Take 1 tablet by mouth daily.    Marland Kitchen PARoxetine (PAXIL) 40 MG tablet Take 40 mg by mouth daily.  2  . polyethylene glycol powder (GLYCOLAX/MIRALAX) powder Take 17 g by mouth 2 (two) times daily as needed. (Patient taking differently: Take 17 g by mouth daily. ) 3350 g 1  . potassium chloride (K-DUR) 10 MEQ tablet Take 1 tablet (10 mEq total) by mouth daily. (Patient not taking: Reported on 05/23/2017) 30 tablet 1  . QUEtiapine (SEROQUEL) 200 MG tablet Take 200 mg by mouth at bedtime.    . ranitidine (ZANTAC) 150 MG tablet Take 1 tablet (150 mg total) by mouth 2 (two) times daily. 30 tablet 1  . traMADol (ULTRAM) 50 MG tablet Take 1 tablet (50 mg total) by mouth every 6 (six) hours as needed for moderate pain. (Patient not taking: Reported on 05/23/2017) 30 tablet 0   No current facility-administered medications for this visit.      Past Medical History:  Diagnosis Date  . ABDOMINAL PAIN RIGHT LOWER QUADRANT  07/26/2009  . Abdominal pain, left lower quadrant 07/26/2009  . ABDOMINAL PAIN, LOWER 11/29/2009  . ANEMIA-B12 DEFICIENCY 07/11/2009  . ANEMIA-IRON DEFICIENCY 06/21/2009  . ANXIETY 02/06/2007  . Arthritis    "back" (12/12/2016)  . BACK PAIN 07/01/2007  . Benzodiazepine overdose 12/03/2011   July 2013  . CAD (coronary artery disease)    a. LHC 12/12/16: Multivessel CAD b. s/p CABG on 12/17/2016 with LIMA-LAD, SVG-D1, and Seq SVG-PDA-PL  . CHEST PAIN 06/21/2009  . DEPRESSION 02/06/2007  . [place] DISEASE, CERVICAL 05/03/2009  . DYSPEPSIA 10/23/2007  . EAR PAIN, RIGHT 11/29/2009  . ELBOW PAIN, RIGHT 06/27/2010  . ELEVATED BLOOD PRESSURE WITHOUT DIAGNOSIS OF HYPERTENSION 07/01/2007  . FEVER UNSPECIFIED 06/03/2008  . FREQUENCY, URINARY 03/12/2007  . Genital herpes 01/03/2011  . GERD 07/11/2009  . Glossitis 10/23/2007  . HEAD TRAUMA, CLOSED 11/29/2009  . History of surgery for cerebral aneurysm 2015  . HYPERLIPIDEMIA 02/06/2007  . Insomnia, unspecified 02/06/2007  . LATERAL EPICONDYLITIS, LEFT 06/03/2008  . LOW BACK PAIN 02/06/2007  . Major depression 11/16/2010  . MIGRAINE HEADACHE 02/06/2007   "used to get them all the time; stopped a few years ago" (12/12/2016)  . OSTEOARTHRITIS 02/06/2007  . 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 OTITIS EXTERNA, RIGHT 10/04/2009  . Stroke [facility]) 2016-2017 X 3   RLE "goes to the side a little bit when I walk" (12/12/2016)  . VITAMIN B12  DEFICIENCY 07/26/2009    Past Surgical History:  Procedure Laterality Date  . ABDOMINAL HYSTERECTOMY  2005   "still have my ovaries"  . ANKLE FRACTURE SURGERY Right ?1991  . ANTERIOR CERVICAL DECOMP/DISCECTOMY FUSION  2008   C5-6  . APPENDECTOMY  04/2001   lap appy/notes 10/11/2010  . BACK SURGERY    . CARDIAC CATHETERIZATION  12/12/2016  . CEREBRAL ANEURYSM REPAIR    . CORONARY ARTERY BYPASS GRAFT N/A 12/17/2016   Procedure: CORONARY ARTERY BYPASS GRAFTING (CABG) x 4, using left internal mammary artery and right greater saphenous vein  harvested endoscopically;  Surgeon: Gaye Pollack, MD;  Location: [place] OR;  Service: Open Heart Surgery;  Laterality: N/A;  . CYSTOSCOPY W/ URETEROSCOPY  02/17/2002    Cystoscopy, right retrograde pyelogram with interpretation, left ureteroscopy with holmium and right ureteroscopy/notes 10/11/2010  . INTRAOPERATIVE TRANSESOPHAGEAL ECHOCARDIOGRAM N/A 12/17/2016   Procedure: INTRAOPERATIVE TRANSESOPHAGEAL ECHOCARDIOGRAM;  Surgeon: Gaye Pollack, MD;  Location: [facility] OR;  Service: Open Heart Surgery;  Laterality: N/A;  . LEFT HEART CATH AND CORONARY ANGIOGRAPHY N/A 12/12/2016   Procedure: Left Heart Cath and Coronary Angiography;  Surgeon: Jettie Booze, MD;  Location: [place] [facility] LAB;  Service: Cardiovascular;  Laterality: N/A;  . TUBAL LIGATION      Social History   Socioeconomic History  . Marital status: Widowed    Spouse name: Not on file  . Number of children: Not on file  . Years of education: Not on file  . Highest education level: Not on file  Occupational History  . 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 Financial resource strain: Not on file  . Food insecurity:    Worry: Not on file    Inability: Not on file  . Transportation needs:    Medical: Not on file    Non-medical: Not on file  Tobacco Use  . Smoking status: Former Smoker    Packs/day: 1.50    Years: 42.00    Pack years: 63.00    Types: Cigarettes    Last attempt to quit: 11/14/2016    Years since quitting: 1.2  . Smokeless tobacco: Never Used  Substance and Sexual Activity  . Alcohol use: Yes    Comment: 12/12/2016 "2 drinks per month"  . Drug use: No  . Sexual activity: Never  Lifestyle  . Physical activity:    Days per week: Not on file    Minutes per session: Not on file  . Stress: Not on file  Relationships  . Social connections:    Talks on phone: Not on file    Gets together: Not on file    Attends religious service: Not  on file    Active member of club or organization: Not on file    Attends meetings of clubs or organizations: Not on file    Relationship status: Not on file  . Intimate partner violence:    Fear of current or ex partner: Not on file    Emotionally abused: Not on file    Physically abused: Not on file    Forced sexual activity: Not on file  Other Topics Concern  . Not on file  Social History Narrative   Currently living apart from husband    Family History  Problem Relation Age of Onset  . Diabetes Other   . Hypertension Other   . Coronary artery disease Other 49  Female 1st degree relative  . Cancer - Lung Father   . Cirrhosis Brother   . Diabetes Brother     ROS: no fevers or chills, productive cough, hemoptysis, dysphasia, odynophagia, melena, hematochezia, dysuria, hematuria, rash, seizure activity, orthopnea, PND, pedal edema, claudication. Remaining systems are negative.  Physical Exam: Well-developed well-nourished in no acute distress.  Skin is warm and dry.  HEENT is normal.  Neck is supple.  Chest is clear to auscultation with normal expansion.  Cardiovascular exam is regular rate and rhythm.  Abdominal exam nontender or distended. No masses palpated. Extremities show no edema. neuro grossly intact  ECG- personally reviewed  A/P  1  Kirk Ruths, MD

## 2018-02-26 DIAGNOSIS — F5101 Primary insomnia: Secondary | ICD-10-CM | POA: Diagnosis not present

## 2018-02-26 DIAGNOSIS — F411 Generalized anxiety disorder: Secondary | ICD-10-CM | POA: Diagnosis not present

## 2018-02-26 DIAGNOSIS — F250 Schizoaffective disorder, bipolar type: Secondary | ICD-10-CM | POA: Diagnosis not present

## 2018-02-26 DIAGNOSIS — F4320 Adjustment disorder, unspecified: Secondary | ICD-10-CM | POA: Diagnosis not present

## 2018-02-27 ENCOUNTER — Ambulatory Visit: Payer: Self-pay | Admitting: Cardiology

## 2018-02-28 ENCOUNTER — Encounter: Payer: Self-pay | Admitting: *Deleted

## 2018-03-05 DIAGNOSIS — F4312 Post-traumatic stress disorder, chronic: Secondary | ICD-10-CM | POA: Diagnosis not present

## 2018-03-05 DIAGNOSIS — F4011 Social phobia, generalized: Secondary | ICD-10-CM | POA: Diagnosis not present

## 2018-03-05 DIAGNOSIS — F250 Schizoaffective disorder, bipolar type: Secondary | ICD-10-CM | POA: Diagnosis not present

## 2018-03-05 DIAGNOSIS — F332 Major depressive disorder, recurrent severe without psychotic features: Secondary | ICD-10-CM | POA: Diagnosis not present

## 2018-04-03 DIAGNOSIS — F250 Schizoaffective disorder, bipolar type: Secondary | ICD-10-CM | POA: Diagnosis not present

## 2018-04-03 DIAGNOSIS — F4320 Adjustment disorder, unspecified: Secondary | ICD-10-CM | POA: Diagnosis not present

## 2018-04-03 DIAGNOSIS — F5101 Primary insomnia: Secondary | ICD-10-CM | POA: Diagnosis not present

## 2018-04-03 DIAGNOSIS — F411 Generalized anxiety disorder: Secondary | ICD-10-CM | POA: Diagnosis not present

## 2018-04-07 DIAGNOSIS — F411 Generalized anxiety disorder: Secondary | ICD-10-CM | POA: Diagnosis not present

## 2018-04-07 DIAGNOSIS — F250 Schizoaffective disorder, bipolar type: Secondary | ICD-10-CM | POA: Diagnosis not present

## 2018-04-07 DIAGNOSIS — F4312 Post-traumatic stress disorder, chronic: Secondary | ICD-10-CM | POA: Diagnosis not present

## 2018-04-07 DIAGNOSIS — F410 Panic disorder [episodic paroxysmal anxiety] without agoraphobia: Secondary | ICD-10-CM | POA: Diagnosis not present

## 2018-05-01 DIAGNOSIS — F410 Panic disorder [episodic paroxysmal anxiety] without agoraphobia: Secondary | ICD-10-CM | POA: Diagnosis not present

## 2018-05-01 DIAGNOSIS — F4312 Post-traumatic stress disorder, chronic: Secondary | ICD-10-CM | POA: Diagnosis not present

## 2018-05-01 DIAGNOSIS — F4011 Social phobia, generalized: Secondary | ICD-10-CM | POA: Diagnosis not present

## 2018-05-01 DIAGNOSIS — F250 Schizoaffective disorder, bipolar type: Secondary | ICD-10-CM | POA: Diagnosis not present

## 2018-05-26 DIAGNOSIS — F410 Panic disorder [episodic paroxysmal anxiety] without agoraphobia: Secondary | ICD-10-CM | POA: Diagnosis not present

## 2018-05-26 DIAGNOSIS — F250 Schizoaffective disorder, bipolar type: Secondary | ICD-10-CM | POA: Diagnosis not present

## 2018-05-26 DIAGNOSIS — F4312 Post-traumatic stress disorder, chronic: Secondary | ICD-10-CM | POA: Diagnosis not present

## 2018-06-21 ENCOUNTER — Emergency Department (HOSPITAL_COMMUNITY)
Admission: EM | Admit: 2018-06-21 | Discharge: 2018-06-21 | Disposition: A | Discharge status: Home / Self Care | Payer: PPO | Attending: Emergency Medicine | Admitting: Emergency Medicine

## 2018-06-21 ENCOUNTER — Emergency Department (HOSPITAL_COMMUNITY): Payer: PPO

## 2018-06-21 ENCOUNTER — Encounter (HOSPITAL_COMMUNITY): Payer: Self-pay

## 2018-06-21 ENCOUNTER — Other Ambulatory Visit: Payer: Self-pay

## 2018-06-21 DIAGNOSIS — S6991XA Unspecified injury of right wrist, hand and finger(s), initial encounter: Secondary | ICD-10-CM | POA: Diagnosis present

## 2018-06-21 DIAGNOSIS — Y939 Activity, unspecified: Secondary | ICD-10-CM | POA: Insufficient documentation

## 2018-06-21 DIAGNOSIS — S52591A Other fractures of lower end of right radius, initial encounter for closed fracture: Secondary | ICD-10-CM | POA: Insufficient documentation

## 2018-06-21 DIAGNOSIS — Z87891 Personal history of nicotine dependence: Secondary | ICD-10-CM | POA: Insufficient documentation

## 2018-06-21 DIAGNOSIS — Z7982 Long term (current) use of aspirin: Secondary | ICD-10-CM | POA: Diagnosis not present

## 2018-06-21 DIAGNOSIS — Z8673 Personal history of transient ischemic attack (TIA), and cerebral infarction without residual deficits: Secondary | ICD-10-CM | POA: Diagnosis not present

## 2018-06-21 DIAGNOSIS — Y929 Unspecified place or not applicable: Secondary | ICD-10-CM | POA: Insufficient documentation

## 2018-06-21 DIAGNOSIS — E785 Hyperlipidemia, unspecified: Secondary | ICD-10-CM | POA: Insufficient documentation

## 2018-06-21 DIAGNOSIS — Y998 Other external cause status: Secondary | ICD-10-CM | POA: Insufficient documentation

## 2018-06-21 DIAGNOSIS — Z79899 Other long term (current) drug therapy: Secondary | ICD-10-CM | POA: Insufficient documentation

## 2018-06-21 DIAGNOSIS — I2510 Atherosclerotic heart disease of native coronary artery without angina pectoris: Secondary | ICD-10-CM | POA: Diagnosis not present

## 2018-06-21 DIAGNOSIS — S52501A Unspecified fracture of the lower end of right radius, initial encounter for closed fracture: Secondary | ICD-10-CM

## 2018-06-21 DIAGNOSIS — W010XXA Fall on same level from slipping, tripping and stumbling without subsequent striking against object, initial encounter: Secondary | ICD-10-CM | POA: Insufficient documentation

## 2018-06-21 DIAGNOSIS — S52571A Other intraarticular fracture of lower end of right radius, initial encounter for closed fracture: Secondary | ICD-10-CM | POA: Diagnosis not present

## 2018-06-21 NOTE — Discharge Instructions (Signed)
Please read attached information. If you experience any new or worsening signs or symptoms please return to the emergency room for evaluation. Please follow-up with your primary care provider or specialist as discussed. Please use medication prescribed only as directed and discontinue taking if you have any concerning signs or symptoms.   °

## 2018-06-21 NOTE — ED Provider Notes (Signed)
[facility] EMERGENCY DEPARTMENT Provider Note   CSN: [id number] Arrival date & time: 06/21/18  1101   History   Chief Complaint Chief Complaint  Patient presents with  . Wrist Injury    HPI Michelle [name] is a 58 y.o. female.  HPI   58 year old female presents today with complaints of right-sided wrist pain.  Patient notes approximate 1 ago she suffered a fall landing on outstretched hand.  She notes deformity at the distal wrist with pain.  She notes the swelling has persisted.  She notes difficulty with extending her finger secondary to discomfort, no loss of sensation, no other injuries noted.  She is left-hand dominant  Past Medical History:  Diagnosis Date  . ABDOMINAL PAIN RIGHT LOWER QUADRANT 07/26/2009  . Abdominal pain, left lower quadrant 07/26/2009  . ABDOMINAL PAIN, LOWER 11/29/2009  . ANEMIA-B12 DEFICIENCY 07/11/2009  . ANEMIA-IRON DEFICIENCY 06/21/2009  . ANXIETY 02/06/2007  . Arthritis    "back" (12/12/2016)  . BACK PAIN 07/01/2007  . Benzodiazepine overdose 12/03/2011   July 2013  . CAD (coronary artery disease)    a. LHC 12/12/16: Multivessel CAD b. s/p CABG on 12/17/2016 with LIMA-LAD, SVG-D1, and Seq SVG-PDA-PL  . CHEST PAIN 06/21/2009  . DEPRESSION 02/06/2007  . [place] DISEASE, CERVICAL 05/03/2009  . DYSPEPSIA 10/23/2007  . EAR PAIN, RIGHT 11/29/2009  . ELBOW PAIN, RIGHT 06/27/2010  . ELEVATED BLOOD PRESSURE WITHOUT DIAGNOSIS OF HYPERTENSION 07/01/2007  . FEVER UNSPECIFIED 06/03/2008  . FREQUENCY, URINARY 03/12/2007  . Genital herpes 01/03/2011  . GERD 07/11/2009  . Glossitis 10/23/2007  . HEAD TRAUMA, CLOSED 11/29/2009  . History of surgery for cerebral aneurysm 2015  . HYPERLIPIDEMIA 02/06/2007  . Insomnia, unspecified 02/06/2007  . LATERAL EPICONDYLITIS, LEFT 06/03/2008  . LOW BACK PAIN 02/06/2007  . Major depression 11/16/2010  . MIGRAINE HEADACHE 02/06/2007   "used to get them all the time; stopped a few years ago" (12/12/2016)  . OSTEOARTHRITIS 02/06/2007    . 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 OTITIS EXTERNA, RIGHT 10/04/2009  . Stroke [facility]) 2016-2017 X 3   RLE "goes to the side a little bit when I walk" (12/12/2016)  . VITAMIN B12 DEFICIENCY 07/26/2009    Patient Active Problem List   Diagnosis Date Noted  . CAD (coronary artery disease) 02/18/2017  . Severe bipolar I disorder, current or most recent episode depressed ([place]) 12/20/2016  . 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 Tobacco abuse 12/13/2016  . Angina pectoris ([place])   . Constipation 12/05/2016  . Bilateral leg edema 12/05/2016  . Estrogen deficiency 12/05/2016  . BCC (basal cell carcinoma of skin) 07/17/2016  . Benzodiazepine overdose 12/03/2011  . Genital herpes 01/03/2011  . Leg weakness, bilateral 01/02/2011  . Vaginal lesion 01/02/2011  . Leukocytosis 01/02/2011  . Major depression 11/16/2010  . Nausea & vomiting 11/16/2010  . Abdominal tenderness 11/16/2010  . Right flank tenderness 11/16/2010  . Preventative health care 10/24/2010  . HTN (hypertension) 08/30/2010  . VITAMIN B12 DEFICIENCY 07/26/2009  . ANEMIA-B12 DEFICIENCY 07/11/2009  . GERD 07/11/2009  . ANEMIA-IRON DEFICIENCY 06/21/2009  . [place] DISEASE, CERVICAL 05/03/2009  . BACK PAIN 07/01/2007  . 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 Hyperlipidemia, unspecified 02/06/2007  . Anxiety state 02/06/2007  . Depression, prolonged 02/06/2007  . MIGRAINE HEADACHE 02/06/2007  . OSTEOARTHRITIS 02/06/2007  . LOW BACK PAIN 02/06/2007  . Insomnia, unspecified 02/06/2007    Past Surgical History:  Procedure Laterality Date  . ABDOMINAL HYSTERECTOMY  2005   "  still have my ovaries"  . ANKLE FRACTURE SURGERY Right ?1991  . ANTERIOR CERVICAL DECOMP/DISCECTOMY FUSION  2008   C5-6  . APPENDECTOMY  04/2001   lap appy/notes 10/11/2010  . BACK SURGERY    . CARDIAC CATHETERIZATION  12/12/2016  . CEREBRAL ANEURYSM REPAIR    . CORONARY ARTERY BYPASS GRAFT N/A 12/17/2016   Procedure: CORONARY ARTERY BYPASS GRAFTING  (CABG) x 4, using left internal mammary artery and right greater saphenous vein harvested endoscopically;  Surgeon: Gaye Pollack, MD;  Location: [facility] OR;  Service: Open Heart Surgery;  Laterality: N/A;  . CYSTOSCOPY W/ URETEROSCOPY  02/17/2002    Cystoscopy, right retrograde pyelogram with interpretation, left ureteroscopy with holmium and right ureteroscopy/notes 10/11/2010  . INTRAOPERATIVE TRANSESOPHAGEAL ECHOCARDIOGRAM N/A 12/17/2016   Procedure: INTRAOPERATIVE TRANSESOPHAGEAL ECHOCARDIOGRAM;  Surgeon: Gaye Pollack, MD;  Location: [facility] OR;  Service: Open Heart Surgery;  Laterality: N/A;  . LEFT HEART CATH AND CORONARY ANGIOGRAPHY N/A 12/12/2016   Procedure: Left Heart Cath and Coronary Angiography;  Surgeon: Jettie Booze, MD;  Location: [facility] CV LAB;  Service: Cardiovascular;  Laterality: N/A;  . TUBAL LIGATION       OB History   No obstetric history on file.      Home Medications    Prior to Admission medications   Medication Sig Start Date End Date Taking? Authorizing Provider  aspirin 325 MG EC tablet Take 1 tablet (325 mg total) by mouth daily. 12/24/16   Elgie Collard, PA-C  atorvastatin (LIPITOR) 20 MG tablet Take 1 tablet (20 mg total) by mouth daily. 12/09/17 03/09/18  Lelon Perla, MD  furosemide (LASIX) 20 MG tablet TAKE 1 TABLET BY MOUTH EVERY DAY Patient not taking: Reported on 05/23/2017 11/14/16   Harland Dingwall L, NP-C  gabapentin (NEURONTIN) 300 MG capsule Take 300 mg by mouth 3 (three) times daily.    [provider]  metoprolol tartrate (LOPRESSOR) 25 MG tablet Take 0.5 tablets (12.5 mg total) by mouth 2 (two) times daily. 12/09/17   Lelon Perla, MD  Multiple Vitamin (MULTIVITAMIN WITH MINERALS) TABS tablet Take 1 tablet by mouth daily.    [provider]  PARoxetine (PAXIL) 40 MG tablet Take 40 mg by mouth daily. 11/29/16   [provider]  polyethylene glycol powder (GLYCOLAX/MIRALAX) powder Take 17 g by mouth 2 (two)  times daily as needed. Patient taking differently: Take 17 g by mouth daily.  12/05/16   Henson, Vickie L, NP-C  potassium chloride (K-DUR) 10 MEQ tablet Take 1 tablet (10 mEq total) by mouth daily. Patient not taking: Reported on 05/23/2017 12/23/16   Elgie Collard, PA-C  QUEtiapine (SEROQUEL) 200 MG tablet Take 200 mg by mouth at bedtime.    [provider]  ranitidine (ZANTAC) 150 MG tablet Take 1 tablet (150 mg total) by mouth 2 (two) times daily. 12/05/16   Henson, Vickie L, NP-C  traMADol (ULTRAM) 50 MG tablet Take 1 tablet (50 mg total) by mouth every 6 (six) hours as needed for moderate pain. Patient not taking: Reported on 05/23/2017 12/27/16   Gaye Pollack, MD    Family History Family History  Problem Relation Age of Onset  . Diabetes Other   . Hypertension Other   . Coronary artery disease Other 53       Female 1st degree relative  . Cancer - Lung Father   . Cirrhosis Brother   . Diabetes Brother     Social History Social History  Tobacco Use  . Smoking status: Former Smoker    Packs/day: 1.50    Years: 42.00    Pack years: 63.00    Types: Cigarettes    Last attempt to quit: 11/14/2016    Years since quitting: 1.6  . Smokeless tobacco: Never Used  Substance Use Topics  . Alcohol use: Yes    Comment: 12/12/2016 "2 drinks per month"  . Drug use: No     Allergies   Citalopram; Diclofenac sodium; Fluoxetine hcl; Ibuprofen; and Lamictal [lamotrigine]   Review of Systems Review of Systems  All other systems reviewed and are negative.    Physical Exam Updated Vital Signs BP 135/79 (BP Location: Left Arm)   Pulse 88   Temp 98.3 F (36.8 C) (Oral)   Resp 17   Ht 5\' 3"  (1.6 m)   Wt 59 kg   SpO2 100%   BMI 23.03 kg/m   Physical Exam Vitals signs and nursing note reviewed.  Constitutional:      Appearance: She is well-developed.  HENT:     Head: Normocephalic and atraumatic.  Eyes:     General: No scleral icterus.       Right eye: No  discharge.        Left eye: No discharge.     Conjunctiva/sclera: Conjunctivae normal.     Pupils: Pupils are equal, round, and reactive to light.  Neck:     Musculoskeletal: Normal range of motion.     Vascular: No JVD.     Trachea: No tracheal deviation.  Pulmonary:     Effort: Pulmonary effort is normal.     Breath sounds: No stridor.  Musculoskeletal:     Comments: Obvious deformity noted to right wrist with dorsal angulation of the distal aspect, radial pulse 2+ sensation intact, patient having difficulty with extension at all fingers and thumb flexion intact  Neurological:     Mental Status: She is alert and oriented to person, place, and time.     Coordination: Coordination normal.  Psychiatric:        Behavior: Behavior normal.        Thought Content: Thought content normal.        Judgment: Judgment normal.      ED Treatments / Results  Labs (all labs ordered are listed, but only abnormal results are displayed) Labs Reviewed - No data to display  EKG None  Radiology Dg Wrist Complete Right  Result Date: 06/21/2018 CLINICAL DATA:  Deformity after fall. EXAM: RIGHT WRIST - COMPLETE 3+ VIEW COMPARISON:  None FINDINGS: There is a comminuted, intra-articular fracture deformity involving the distal radius. There is dorsal angulation of the distal fracture fragments. The fracture lines are slightly indistinct compatible with the subacute nature of this injury. IMPRESSION: 1. Comminuted, intra-articular fracture of the distal radius with dorsal angulation. Electronically Signed   By: Kerby Moors M.D.   On: 06/21/2018 12:04    Procedures Procedures (including critical care time)  Medications Ordered in ED Medications - No data to display   Initial Impression / Assessment and Plan / ED Course  I have reviewed the triage vital signs and the nursing notes.  Pertinent labs & imaging results that were available during my care of the patient were reviewed by me and  considered in my medical decision making (see chart for details).     58 year old female presents today with distal radius fracture.  This was approximately 1 month ago.  She has no acute neurovascular compromise that  would require immediate management.  Discussed case with orthopedic hand surgeon who would like patient to follow-up in clinic.  Patient placed in a splint encouraged follow-up closely with hand specialty return immediately if she develops any new or worsening signs or symptoms Final Clinical Impressions(s) / ED Diagnoses   Final diagnoses:  Closed fracture of distal end of right radius, unspecified fracture morphology, initial encounter    ED Discharge Orders    None       Michelle [name] 06/21/18 [place], DO 06/21/18 1436

## 2018-06-21 NOTE — ED Triage Notes (Signed)
Pt presents for eval of right wrist pain and swelling after a fall x1 month ago. Pt states she did not go to the doctor, states she thought it would get better. Swelling and limited ROM noted to right wrist. Good pulses, cap refill <3 sec.

## 2018-06-21 NOTE — ED Notes (Signed)
Patient verbalizes understanding of discharge instructions. Opportunity for questioning and answers were provided. Armband removed by staff, pt discharged from ED.  

## 2018-06-21 NOTE — Progress Notes (Signed)
Orthopedic Tech Progress Note Patient Details:  Michelle [name] 04-02-1961 [id number] She had a lot of discomfort while the splint was going on.  Ortho Devices Type of Ortho Device: Sugartong splint Ortho Device/Splint Location: right arm Ortho Device/Splint Interventions: Adjustment, Application, Ordered   Post Interventions Patient Tolerated: Fair Instructions Provided: Care of device, Adjustment of device   [name] 06/21/2018, 12:53 PM

## 2018-06-21 NOTE — ED Notes (Signed)
Ortho tech at bedside 

## 2018-06-24 DIAGNOSIS — S52501A Unspecified fracture of the lower end of right radius, initial encounter for closed fracture: Secondary | ICD-10-CM | POA: Insufficient documentation

## 2018-06-24 DIAGNOSIS — S52571A Other intraarticular fracture of lower end of right radius, initial encounter for closed fracture: Secondary | ICD-10-CM | POA: Diagnosis not present

## 2018-06-25 ENCOUNTER — Telehealth: Payer: Self-pay | Admitting: Cardiology

## 2018-06-25 ENCOUNTER — Telehealth: Payer: Self-pay | Admitting: *Deleted

## 2018-06-25 NOTE — Progress Notes (Signed)
Cardiology Office Note   Date:  06/26/2018   ID:  Michelle [name], DOB 1960/12/23, MRN [id number]  PCP:  Girtha Rm, NP-C  Cardiologist: Leighton Roach chief complaint on file.  History of Present Illness: Michelle [name] is a 58 y.o. female who presents for ongoing assessment and management of CAD, with most recent cardiac cath on 7/18 revealing severe 2 vessel disease, ultimately leading to CABG on  12/17/2016 with LIMA to LAD, SVG to D1, and sequential SVG -PDA-PL. He was last seen by Dr. Stanford Breed on 05/23/2017 and was stable. She is here for pre-operative evaluation for surgical repair of wrist on 06/27/2018.  She offers no cardiac complaints. She had been on metoprolol XL 100 mg at HS and cut it back to 50 mg as this was making her sleepy. She is tachycardic today, HR of 103 bpm. She is not dyspneic or dizzy.   Past Medical History:  Diagnosis Date  . ABDOMINAL PAIN RIGHT LOWER QUADRANT 07/26/2009  . Abdominal pain, left lower quadrant 07/26/2009  . ABDOMINAL PAIN, LOWER 11/29/2009  . ANEMIA-B12 DEFICIENCY 07/11/2009  . ANEMIA-IRON DEFICIENCY 06/21/2009  . ANXIETY 02/06/2007  . Arthritis    "back" (12/12/2016)  . BACK PAIN 07/01/2007  . Benzodiazepine overdose 12/03/2011   July 2013  . CAD (coronary artery disease)    a. LHC 12/12/16: Multivessel CAD b. s/p CABG on 12/17/2016 with LIMA-LAD, SVG-D1, and Seq SVG-PDA-PL  . CHEST PAIN 06/21/2009  . DEPRESSION 02/06/2007  . [place] DISEASE, CERVICAL 05/03/2009  . DYSPEPSIA 10/23/2007  . EAR PAIN, RIGHT 11/29/2009  . ELBOW PAIN, RIGHT 06/27/2010  . ELEVATED BLOOD PRESSURE WITHOUT DIAGNOSIS OF HYPERTENSION 07/01/2007  . FEVER UNSPECIFIED 06/03/2008  . FREQUENCY, URINARY 03/12/2007  . Genital herpes 01/03/2011  . GERD 07/11/2009  . Glossitis 10/23/2007  . HEAD TRAUMA, CLOSED 11/29/2009  . History of surgery for cerebral aneurysm 2015  . HYPERLIPIDEMIA 02/06/2007  . Insomnia, unspecified 02/06/2007  . LATERAL EPICONDYLITIS, LEFT 06/03/2008  . LOW BACK PAIN  02/06/2007  . Major depression 11/16/2010  . MIGRAINE HEADACHE 02/06/2007   "used to get them all the time; stopped a few years ago" (12/12/2016)  . OSTEOARTHRITIS 02/06/2007  . 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 OTITIS EXTERNA, RIGHT 10/04/2009  . Stroke [facility]) 2016-2017 X 3   RLE "goes to the side a little bit when I walk" (12/12/2016)  . VITAMIN B12 DEFICIENCY 07/26/2009    Past Surgical History:  Procedure Laterality Date  . ABDOMINAL HYSTERECTOMY  2005   "still have my ovaries"  . ANKLE FRACTURE SURGERY Right ?1991  . ANTERIOR CERVICAL DECOMP/DISCECTOMY FUSION  2008   C5-6  . APPENDECTOMY  04/2001   lap appy/notes 10/11/2010  . BACK SURGERY    . CARDIAC CATHETERIZATION  12/12/2016  . CEREBRAL ANEURYSM REPAIR    . CORONARY ARTERY BYPASS GRAFT N/A 12/17/2016   Procedure: CORONARY ARTERY BYPASS GRAFTING (CABG) x 4, using left internal mammary artery and right greater saphenous vein harvested endoscopically;  Surgeon: Gaye Pollack, MD;  Location: [facility] OR;  Service: Open Heart Surgery;  Laterality: N/A;  . CYSTOSCOPY W/ URETEROSCOPY  02/17/2002    Cystoscopy, right retrograde pyelogram with interpretation, left ureteroscopy with holmium and right ureteroscopy/notes 10/11/2010  . INTRAOPERATIVE TRANSESOPHAGEAL ECHOCARDIOGRAM N/A 12/17/2016   Procedure: INTRAOPERATIVE TRANSESOPHAGEAL ECHOCARDIOGRAM;  Surgeon: Gaye Pollack, MD;  Location: [facility] OR;  Service: Open Heart Surgery;  Laterality: N/A;  . LEFT HEART CATH AND CORONARY ANGIOGRAPHY N/A 12/12/2016   Procedure:  Left Heart Cath and Coronary Angiography;  Surgeon: Jettie Booze, MD;  Location: [facility] LAB;  Service: Cardiovascular;  Laterality: N/A;  . TUBAL LIGATION       Current Outpatient Medications  Medication Sig Dispense Refill  . aspirin 325 MG EC tablet Take 1 tablet (325 mg total) by mouth daily. 30 tablet 0  . gabapentin (NEURONTIN) 300 MG capsule Take 300 mg by mouth 3 (three) times daily.    . metoprolol  succinate (TOPROL-XL) 25 MG 24 hr tablet Take 25 mg by mouth daily.    . Multiple Vitamin (MULTIVITAMIN WITH MINERALS) TABS tablet Take 1 tablet by mouth daily.    Marland Kitchen PARoxetine (PAXIL) 40 MG tablet Take 40 mg by mouth daily.  2  . QUEtiapine (SEROQUEL) 200 MG tablet Take 200 mg by mouth at bedtime.    . ranitidine (ZANTAC) 150 MG tablet Take 1 tablet (150 mg total) by mouth 2 (two) times daily. 30 tablet 1  . traMADol (ULTRAM) 50 MG tablet Take 1 tablet (50 mg total) by mouth every 6 (six) hours as needed for moderate pain. 30 tablet 0   No current facility-administered medications for this visit.     Allergies:   Citalopram; Diclofenac sodium; Fluoxetine hcl; Ibuprofen; and Lamictal [lamotrigine]    Social History:  The patient  reports that she quit smoking about 19 months ago. Her smoking use included cigarettes. She has a 63.00 pack-year smoking history. She has never used smokeless tobacco. She reports current alcohol use. She reports that she does not use drugs.   Family History:  The patient's family history includes Cancer - Lung in her father; Cirrhosis in her brother; Coronary artery disease (age of onset: 5) in an other family member; Diabetes in her brother and another family member; Hypertension in an other family member.    ROS: All other systems are reviewed and negative. Unless otherwise mentioned in H&P    PHYSICAL EXAM: VS:  BP 100/70   Pulse (!) 102   Ht 5\' 3"  (1.6 m)   Wt 130 lb 3.2 oz (59.1 kg)   BMI 23.06 kg/m  , BMI Body mass index is 23.06 kg/m. GEN: Well nourished, well developed, in no acute distress HEENT: normal Neck: no JVD, carotid bruits, or masses Cardiac: RRR Tachycardic ; no murmurs, rubs, or gallops,no edema  Respiratory: Clear to auscultation bilaterally, normal work of breathing GI: soft, nontender, nondistended, + BS MS: no deformity or atrophy.Wearing right wrist brace. Skin: warm and dry, no rash Neuro:  Strength and sensation are  intact Psych: euthymic mood, full affect   EKG:  Sinus tachycardia with RBBB, rate of 102 bpm T-wave inversion noted septal and anterior.    Recent Labs: No results found for requested labs within last 8760 hours.    Lipid Panel    Component Value Date/Time   CHOL 127 02/18/2017 1101   TRIG 184 (H) 02/18/2017 1101   HDL 43 02/18/2017 1101   CHOLHDL 3.0 02/18/2017 1101   CHOLHDL 3.4 12/14/2016 0240   VLDL 35 12/14/2016 0240   LDLCALC 47 02/18/2017 1101   LDLDIRECT 189.4 06/27/2010 0940      Wt Readings from Last 3 Encounters:  06/26/18 130 lb 3.2 oz (59.1 kg)  06/21/18 130 lb (59 kg)  05/23/17 140 lb (63.5 kg)    Other studies Reviewed: Echocardiogram 08-Jan-2017 - Left ventricle: The cavity size was normal. There was mild focal   basal hypertrophy of the septum. Systolic function was  normal.   The estimated ejection fraction was in the range of 60% to 65%.   Wall motion was normal; there were no regional wall motion   abnormalities. Doppler parameters are consistent with abnormal   left ventricular relaxation (grade 1 diastolic dysfunction). - Mitral valve: There was trivial regurgitation. - Tricuspid valve: There was trivial regurgitation.  Cath 12/12/2016  Mid RCA lesion, 60 %stenosed.  RPDA lesion, 95 %stenosed.  Dist RCA lesion, 95 %stenosed.  Post Atrio lesion, 95 %stenosed.  Mid LAD lesion, 80 %stenosed.  Ost 2nd Diag to 2nd Diag lesion, 75 %stenosed.  The left ventricular systolic function is normal.  LV end diastolic pressure is normal.  The left ventricular ejection fraction is 55-65% by visual estimate.  There is no aortic valve stenosis.   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   It is reasonable to consider CABG given the bifurcation disease, although there are percutaneous options as well.    CABG 12/17/2016  1. Median Sternotomy 2. Extracorporeal circulation 3.   Coronary artery bypass grafting  x 4   Left internal mammary graft to the LAD  SVG to diagonal  Sequential SVG to PDA and PL  4.   Endoscopic vein harvest from the right leg  ASSESSMENT AND PLAN:  1. Pre-Operative Evaluation: I am not pleased with her HR. She is supposed to be on Metoprolol XL 100 mg daily but reduced this as she felt this was making her too sleepy. I have asked her to increase her Metoprolol dose to 75 ng at HS for better HR control.this will not delay wrist repair.   Chart reviewed as part of pre-operative protocol coverage. Given past medical history and time since last visit, based on ACC/AHA guidelines, Michelle [name] would be at acceptable risk for the planned procedure without further cardiovascular testing. She may stop ASA for surgery and start ASAP thereafter.   2.CAD: Hx of CABG in 2018. She offers no cardiac complaints. I have increased her metoprolol to 75 mg at HS for elevated HR. I checked her HR on previous EKG and found it to be better controlled than previous rate of 123 bpm, prior to dose increase of metoprolol from 25 mg daily. She will be seen in one month for follow up.   3. HTN: Doing well, controlled currently. Can titrate medications on next visit if necessary.     Current medicines are reviewed at length with the patient today.    Labs/ tests ordered today include: None  Phill Myron. [place] Pugh, ANP, AACC   06/26/2018 9:58 AM    [facility] [place] [address] Office [phone] Fax [phone]

## 2018-06-25 NOTE — Telephone Encounter (Signed)
   [facility] Pre-operative Risk Assessment    Request for surgical clearance:  1. What type of surgery is being performed?  Open reduction in distal radius fracture with right carpal tunnel release  2. When is this surgery scheduled? 06/27/18  3. What type of clearance is required (medical clearance vs. Pharmacy clearance to hold med vs. Both)? MEDICAL  4. Are there any medications that need to be held prior to surgery and how long? ASPIRIN 81 MG  5. Practice name and name of physician performing surgery? [facility]; DR Charlotte Crumb  6. What is your office phone number [phone]   7.   What is your office fax number [phone]; ATTN Odessa Fleming RN  8.   Anesthesia type (None, local, MAC, general) ? UNKNOWN   Michelle [name] 06/25/2018, 12:01 PM  _________________________________________________________________   (provider comments below)

## 2018-06-25 NOTE — Telephone Encounter (Signed)
New message     Pt daughter brittany stated there pt broke her wrist and needs to be seen to be cleared for sx. She stated that sx is on 06/27/18.please follow up . Advised to have office call as well

## 2018-06-25 NOTE — Telephone Encounter (Signed)
No DPR to talk with the pt daughter. Call returned to the pt. The only available appt is tomorrow @ 9am with Jory Sims, DNP. Pt sts that she does not drive and will need to secure a ride. Pt aware of appt date, time,and location. Adv pt to call back if she is unable to keep the appt.

## 2018-06-26 ENCOUNTER — Ambulatory Visit (INDEPENDENT_AMBULATORY_CARE_PROVIDER_SITE_OTHER): Payer: PPO | Admitting: Adult Health

## 2018-06-26 ENCOUNTER — Encounter: Payer: Self-pay | Admitting: Adult Health

## 2018-06-26 VITALS — BP 100/70 | HR 102 | Ht 63.0 in | Wt 130.2 lb

## 2018-06-26 DIAGNOSIS — I10 Essential (primary) hypertension: Secondary | ICD-10-CM | POA: Diagnosis not present

## 2018-06-26 DIAGNOSIS — R000 Tachycardia, unspecified: Secondary | ICD-10-CM

## 2018-06-26 DIAGNOSIS — I2510 Atherosclerotic heart disease of native coronary artery without angina pectoris: Secondary | ICD-10-CM | POA: Diagnosis not present

## 2018-06-26 DIAGNOSIS — Z01810 Encounter for preprocedural cardiovascular examination: Secondary | ICD-10-CM

## 2018-06-26 MED ORDER — METOPROLOL SUCCINATE ER 25 MG PO TB24: 25.0000 mg | ORAL_TABLET | Freq: Every day | ORAL | 3 refills | Status: DC

## 2018-06-26 MED ORDER — ASPIRIN EC 81 MG PO TBEC: 325.0000 mg | DELAYED_RELEASE_TABLET | Freq: Every day | ORAL | Status: DC

## 2018-06-26 MED ORDER — METOPROLOL SUCCINATE ER 50 MG PO TB24: 50.0000 mg | ORAL_TABLET | Freq: Every day | ORAL | 3 refills | Status: DC

## 2018-06-26 NOTE — Telephone Encounter (Signed)
   Primary Cardiologist: Kirk Ruths, MD  Chart reviewed as part of pre-operative protocol coverage.   Patient was seen by Jory Sims, NP 06/26/2018. Will route her note to the requesting party via [facility] fax function and remove from pre-op pool.  Please call with questions.  Abigail Butts, PA-C 06/26/2018, 4:42 PM

## 2018-06-26 NOTE — Patient Instructions (Addendum)
Medication Instructions:  INCREASE METOPROLOL 75MG  AT BEDTIME (25MG  AND 50MG TAB) If you need a refill on your cardiac medications before your next appointment, please call your pharmacy.  Labwork: NONE HERE IN OUR OFFICE AT [facility]   When you have your labs (blood work) drawn today and your tests are completely normal, you will receive your results only by MyChart Message (if you have MyChart) -OR-  A paper copy in the mail.  If you have any lab test that is abnormal or we need to change your treatment, we will call you to review these results.  Special Instructions: CLEARED FOR Open reduction in distal radius fracture with right carpal tunnel release@[facility] [place]; DR Charlotte Crumb  Follow-Up: You will need a follow up appointment in 1 months.  You may see Kirk Ruths, MD Jory Sims, DNP, AACC  or one of the following Advanced Practice Providers on your designated Care Team:  Kerin Ransom, PA-C Roby Lofts, PA-C Sande Rives, [place]  At [facility], you and your health needs are our priority.  As part of our continuing mission to provide you with exceptional heart care, we have created designated Provider Care Teams.  These Care Teams include your primary Cardiologist (physician) and Advanced Practice Providers (APPs -  Physician Assistants and Nurse Practitioners) who all work together to provide you with the care you need, when you need it.  Thank you for choosing [facility] at [facility]!!

## 2018-06-27 DIAGNOSIS — G5601 Carpal tunnel syndrome, right upper limb: Secondary | ICD-10-CM | POA: Diagnosis not present

## 2018-06-27 DIAGNOSIS — G8918 Other acute postprocedural pain: Secondary | ICD-10-CM | POA: Diagnosis not present

## 2018-06-27 DIAGNOSIS — Y999 Unspecified external cause status: Secondary | ICD-10-CM | POA: Diagnosis not present

## 2018-06-27 DIAGNOSIS — X58XXXA Exposure to other specified factors, initial encounter: Secondary | ICD-10-CM | POA: Diagnosis not present

## 2018-06-27 DIAGNOSIS — S52571A Other intraarticular fracture of lower end of right radius, initial encounter for closed fracture: Secondary | ICD-10-CM | POA: Diagnosis not present

## 2018-07-01 DIAGNOSIS — M25631 Stiffness of right wrist, not elsewhere classified: Secondary | ICD-10-CM | POA: Diagnosis not present

## 2018-07-01 DIAGNOSIS — M25531 Pain in right wrist: Secondary | ICD-10-CM | POA: Diagnosis not present

## 2018-07-01 DIAGNOSIS — S52571A Other intraarticular fracture of lower end of right radius, initial encounter for closed fracture: Secondary | ICD-10-CM | POA: Diagnosis not present

## 2018-07-23 ENCOUNTER — Ambulatory Visit (INDEPENDENT_AMBULATORY_CARE_PROVIDER_SITE_OTHER): Payer: PPO | Admitting: Adult Health

## 2018-07-23 ENCOUNTER — Encounter: Payer: Self-pay | Admitting: Adult Health

## 2018-07-23 VITALS — BP 100/56 | HR 89 | Ht 62.0 in | Wt 133.8 lb

## 2018-07-23 DIAGNOSIS — E7800 Pure hypercholesterolemia, unspecified: Secondary | ICD-10-CM | POA: Diagnosis not present

## 2018-07-23 DIAGNOSIS — Z720 Tobacco use: Secondary | ICD-10-CM

## 2018-07-23 DIAGNOSIS — I2510 Atherosclerotic heart disease of native coronary artery without angina pectoris: Secondary | ICD-10-CM | POA: Diagnosis not present

## 2018-07-23 NOTE — Progress Notes (Signed)
Cardiology Office Note   Date:  07/23/2018   ID:  Michelle [name], DOB 1961-03-23, MRN [id number]  PCP:  Girtha Rm, NP-C  Cardiologist:  Stanford Breed  Chief Complaint  Patient presents with  . Coronary Artery Disease  . Follow-up     History of Present Illness: Michelle [name] is a 58 y.o. female who presents for  for ongoing assessment and management of CAD, with most recent cardiac cath on 7/18 revealing severe 2 vessel disease, ultimately leading to CABG on  12/17/2016 with LIMA to LAD, SVG to D1, and sequential SVG -PDA-PL. She was last seen in the office on 06/16/2018 and was seen for pre-operative evaluation for wrist repair.   She is now recovering from her surgery. She has gone back to smoking and took herself off of the BB. She stated it was making her feel too tired. She is very inactive and continues to suffer from psychological issues. She is on multiple medications for this.   Past Medical History:  Diagnosis Date  . ABDOMINAL PAIN RIGHT LOWER QUADRANT 07/26/2009  . Abdominal pain, left lower quadrant 07/26/2009  . ABDOMINAL PAIN, LOWER 11/29/2009  . ANEMIA-B12 DEFICIENCY 07/11/2009  . ANEMIA-IRON DEFICIENCY 06/21/2009  . ANXIETY 02/06/2007  . Arthritis    "back" (12/12/2016)  . BACK PAIN 07/01/2007  . Benzodiazepine overdose 12/03/2011   July 2013  . CAD (coronary artery disease)    a. LHC 12/12/16: Multivessel CAD b. s/p CABG on 12/17/2016 with LIMA-LAD, SVG-D1, and Seq SVG-PDA-PL  . CHEST PAIN 06/21/2009  . DEPRESSION 02/06/2007  . Dietrich DISEASE, CERVICAL 05/03/2009  . DYSPEPSIA 10/23/2007  . EAR PAIN, RIGHT 11/29/2009  . ELBOW PAIN, RIGHT 06/27/2010  . ELEVATED BLOOD PRESSURE WITHOUT DIAGNOSIS OF HYPERTENSION 07/01/2007  . FEVER UNSPECIFIED 06/03/2008  . FREQUENCY, URINARY 03/12/2007  . Genital herpes 01/03/2011  . GERD 07/11/2009  . Glossitis 10/23/2007  . HEAD TRAUMA, CLOSED 11/29/2009  . History of surgery for cerebral aneurysm 2015  . HYPERLIPIDEMIA 02/06/2007  . Insomnia,  unspecified 02/06/2007  . LATERAL EPICONDYLITIS, LEFT 06/03/2008  . LOW BACK PAIN 02/06/2007  . Major depression 11/16/2010  . MIGRAINE HEADACHE 02/06/2007   "used to get them all the time; stopped a few years ago" (12/12/2016)  . OSTEOARTHRITIS 02/06/2007  . 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 OTITIS EXTERNA, RIGHT 10/04/2009  . Stroke [facility]) 2016-2017 X 3   RLE "goes to the side a little bit when I walk" (12/12/2016)  . VITAMIN B12 DEFICIENCY 07/26/2009    Past Surgical History:  Procedure Laterality Date  . ABDOMINAL HYSTERECTOMY  2005   "still have my ovaries"  . ANKLE FRACTURE SURGERY Right ?1991  . ANTERIOR CERVICAL DECOMP/DISCECTOMY FUSION  2008   C5-6  . APPENDECTOMY  04/2001   lap appy/notes 10/11/2010  . BACK SURGERY    . CARDIAC CATHETERIZATION  12/12/2016  . CEREBRAL ANEURYSM REPAIR    . CORONARY ARTERY BYPASS GRAFT N/A 12/17/2016   Procedure: CORONARY ARTERY BYPASS GRAFTING (CABG) x 4, using left internal mammary artery and right greater saphenous vein harvested endoscopically;  Surgeon: Gaye Pollack, MD;  Location: [place] OR;  Service: Open Heart Surgery;  Laterality: N/A;  . CYSTOSCOPY W/ URETEROSCOPY  02/17/2002    Cystoscopy, right retrograde pyelogram with interpretation, left ureteroscopy with holmium and right ureteroscopy/notes 10/11/2010  . INTRAOPERATIVE TRANSESOPHAGEAL ECHOCARDIOGRAM N/A 12/17/2016   Procedure: INTRAOPERATIVE TRANSESOPHAGEAL ECHOCARDIOGRAM;  Surgeon: Gaye Pollack, MD;  Location: [facility];  Service: Open Heart Surgery;  Laterality:  N/A;  . LEFT HEART CATH AND CORONARY ANGIOGRAPHY N/A 2016/12/16   Procedure: Left Heart Cath and Coronary Angiography;  Surgeon: Jettie Booze, MD;  Location: [facility];  Service: Cardiovascular;  Laterality: N/A;  . TUBAL LIGATION       Current Outpatient Medications  Medication Sig Dispense Refill  . aspirin 81 MG tablet Take 4 tablets (325 mg total) by mouth daily.    Marland Kitchen gabapentin (NEURONTIN) 400 MG  capsule 2 CAPSULES BY MOUTH 3 TIMES A DAY    . Multiple Vitamin (MULTIVITAMIN WITH MINERALS) TABS tablet Take 1 tablet by mouth daily.    Marland Kitchen PARoxetine (PAXIL) 40 MG tablet Take 40 mg by mouth daily.  2  . QUEtiapine (SEROQUEL) 200 MG tablet Take 200 mg by mouth at bedtime.    Marland Kitchen venlafaxine XR (EFFEXOR-XR) 75 MG 24 hr capsule 1 CAPSULE BY MOUTH 1 TIMES A DAY     No current facility-administered medications for this visit.     Allergies:   Citalopram; Diclofenac sodium; Fluoxetine hcl; Ibuprofen; and Lamictal [lamotrigine]    Social History:  The patient  reports that she quit smoking about 20 months ago. Her smoking use included cigarettes. She has a 63.00 pack-year smoking history. She has never used smokeless tobacco. She reports current alcohol use. She reports that she does not use drugs.   Family History:  The patient's family history includes Cancer - Lung in her father; Cirrhosis in her brother; Coronary artery disease (age of onset: 56) in an other family member; Diabetes in her brother and another family member; Hypertension in an other family member.    ROS: All other systems are reviewed and negative. Unless otherwise mentioned in H&P    PHYSICAL EXAM: VS:  BP (!) 100/56   Pulse 89   Ht 5\' 2"  (1.575 m)   Wt 133 lb 12.8 oz (60.7 kg)   SpO2 98%   BMI 24.47 kg/m  , BMI Body mass index is 24.47 kg/m. GEN: Well nourished, well developed, in no acute distress HEENT: normal Neck: no JVD, carotid bruits, or masses Cardiac: RRR; no murmurs, rubs, or gallops,no edema  Respiratory:  Upper airway wheezes.  GI: soft, nontender, nondistended, + BS MS: no deformity or atrophy.Right wrist dressing with splint. Skin: warm and dry, no rash Neuro:  Strength and sensation are intact Psych: euthymic mood, flat affect   EKG: Not completed this office visit.   Recent Labs: No results found for requested labs within last 8760 hours.    Lipid Panel    Component Value Date/Time    CHOL 127 02/18/2017 1101   TRIG 184 (H) 02/18/2017 1101   HDL 43 02/18/2017 1101   CHOLHDL 3.0 02/18/2017 1101   CHOLHDL 3.4 12/14/2016 0240   VLDL 35 12/14/2016 0240   LDLCALC 47 02/18/2017 1101   LDLDIRECT 189.4 06/27/2010 0940      Wt Readings from Last 3 Encounters:  07/23/18 133 lb 12.8 oz (60.7 kg)  06/26/18 130 lb 3.2 oz (59.1 kg)  06/21/18 130 lb (59 kg)      Other studies Reviewed: Echocardiogram 2016/12/16 - Left ventricle: The cavity size was normal. There was mild focal basal hypertrophy of the septum. Systolic function was normal. The estimated ejection fraction was in the range of 60% to 65%. Wall motion was normal; there were no regional wall motion abnormalities. Doppler parameters are consistent with abnormal left ventricular relaxation (grade 1 diastolic dysfunction). - Mitral valve: There was trivial regurgitation. -  Tricuspid valve: There was trivial regurgitation.  Cath 12/12/2016  Mid RCA lesion, 60 %stenosed.  RPDA lesion, 95 %stenosed.  Dist RCA lesion, 95 %stenosed.  Post Atrio lesion, 95 %stenosed.  Mid LAD lesion, 80 %stenosed.  Ost 2nd Diag to 2nd Diag lesion, 75 %stenosed.  The left ventricular systolic function is normal.  LV end diastolic pressure is normal.  The left ventricular ejection fraction is 55-65% by visual estimate.  There is no aortic valve stenosis.  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   It is reasonable to consider CABG given the bifurcation disease, although there are percutaneous options as well.   CABG 12/17/2016  1. Median Sternotomy 2. Extracorporeal circulation 3. Coronary artery bypass grafting x 4   Left internal mammary graft to the LAD  SVG to diagonal  Sequential SVG to PDA and PL  4. Endoscopic vein harvest from the rightleg  ASSESSMENT AND PLAN:  1.CAD: Hx of CABG in 2018. She unfortunately took herself off of the metoprolol,  stating that she was getting too tired on it. HR and BP are currently controlled. I have advised her that staying on the metoprolol would be cardioprotective. She is not interested in starting back   2. Hypercholesterolemia: She is currently not taking a statin medication. This is recommended to her. She is not interested in continuing this therapy.   3. Ongoing tobacco abuse:She is counseled on smoking cessation. She is not interested in quitting at this time. She is aware of the dangers of continuing to smoke.   4. Depression and Anxiety: On multiple psychiatric medications. Defer to PCP or psychiatry.    Current medicines are reviewed at length with the patient today.    Labs/ tests ordered today include: None   Phill Myron. [place] Pugh, ANP, AACC   07/23/2018 5:47 PM    [facility] [place] [address] Office [phone] Fax [phone]

## 2018-07-23 NOTE — Patient Instructions (Signed)
Follow-Up: You will need a follow up appointment in 6 months.  Please call our office 2 months in advance (July 2020)  to schedule this (September 2020)  appointment.  You may see Kirk Ruths, MD, Jory Sims, DNP, AACC or one of the following Advanced Practice Providers on your designated Care Team:  Kerin Ransom, [place]  Roby Lofts, PA-C  Sande Rives, [place]       Medication Instructions:  NO CHANGES- Your physician recommends that you continue on your current medications as directed. Please refer to the Current Medication list given to you today. If you need a refill on your cardiac medications before your next appointment, please call your pharmacy. Labwork: When you have labs (blood work) and your tests are completely normal, you will receive your results ONLY by [place] (if you have MyChart) -OR- A paper copy in the mail.  At [facility], you and your health needs are our priority.  As part of our continuing mission to provide you with exceptional heart care, we have created designated Provider Care Teams.  These Care Teams include your primary Cardiologist (physician) and Advanced Practice Providers (APPs -  Physician Assistants and Nurse Practitioners) who all work together to provide you with the care you need, when you need it.  Thank you for choosing [facility] at [facility]!!

## 2018-07-24 DIAGNOSIS — F410 Panic disorder [episodic paroxysmal anxiety] without agoraphobia: Secondary | ICD-10-CM | POA: Diagnosis not present

## 2018-07-24 DIAGNOSIS — F4312 Post-traumatic stress disorder, chronic: Secondary | ICD-10-CM | POA: Diagnosis not present

## 2018-07-24 DIAGNOSIS — F250 Schizoaffective disorder, bipolar type: Secondary | ICD-10-CM | POA: Diagnosis not present

## 2018-07-24 DIAGNOSIS — F1020 Alcohol dependence, uncomplicated: Secondary | ICD-10-CM | POA: Diagnosis not present

## 2018-07-24 DIAGNOSIS — F1110 Opioid abuse, uncomplicated: Secondary | ICD-10-CM | POA: Diagnosis not present

## 2018-07-24 DIAGNOSIS — F1220 Cannabis dependence, uncomplicated: Secondary | ICD-10-CM | POA: Diagnosis not present

## 2018-08-05 DIAGNOSIS — F5101 Primary insomnia: Secondary | ICD-10-CM | POA: Diagnosis not present

## 2018-08-05 DIAGNOSIS — F250 Schizoaffective disorder, bipolar type: Secondary | ICD-10-CM | POA: Diagnosis not present

## 2018-08-05 DIAGNOSIS — F411 Generalized anxiety disorder: Secondary | ICD-10-CM | POA: Diagnosis not present

## 2018-08-05 DIAGNOSIS — F4320 Adjustment disorder, unspecified: Secondary | ICD-10-CM | POA: Diagnosis not present

## 2018-09-05 DIAGNOSIS — F411 Generalized anxiety disorder: Secondary | ICD-10-CM | POA: Diagnosis not present

## 2018-09-05 DIAGNOSIS — F250 Schizoaffective disorder, bipolar type: Secondary | ICD-10-CM | POA: Diagnosis not present

## 2018-09-05 DIAGNOSIS — F5101 Primary insomnia: Secondary | ICD-10-CM | POA: Diagnosis not present

## 2018-09-05 DIAGNOSIS — F4320 Adjustment disorder, unspecified: Secondary | ICD-10-CM | POA: Diagnosis not present

## 2018-09-16 DIAGNOSIS — F410 Panic disorder [episodic paroxysmal anxiety] without agoraphobia: Secondary | ICD-10-CM | POA: Diagnosis not present

## 2018-09-16 DIAGNOSIS — F4312 Post-traumatic stress disorder, chronic: Secondary | ICD-10-CM | POA: Diagnosis not present

## 2018-09-16 DIAGNOSIS — F250 Schizoaffective disorder, bipolar type: Secondary | ICD-10-CM | POA: Diagnosis not present

## 2018-10-09 DIAGNOSIS — F4312 Post-traumatic stress disorder, chronic: Secondary | ICD-10-CM | POA: Diagnosis not present

## 2018-10-09 DIAGNOSIS — F411 Generalized anxiety disorder: Secondary | ICD-10-CM | POA: Diagnosis not present

## 2018-10-09 DIAGNOSIS — F410 Panic disorder [episodic paroxysmal anxiety] without agoraphobia: Secondary | ICD-10-CM | POA: Diagnosis not present

## 2018-10-09 DIAGNOSIS — F250 Schizoaffective disorder, bipolar type: Secondary | ICD-10-CM | POA: Diagnosis not present

## 2018-10-13 DIAGNOSIS — F4320 Adjustment disorder, unspecified: Secondary | ICD-10-CM | POA: Diagnosis not present

## 2018-10-13 DIAGNOSIS — F250 Schizoaffective disorder, bipolar type: Secondary | ICD-10-CM | POA: Diagnosis not present

## 2018-10-13 DIAGNOSIS — F5101 Primary insomnia: Secondary | ICD-10-CM | POA: Diagnosis not present

## 2018-10-13 DIAGNOSIS — F411 Generalized anxiety disorder: Secondary | ICD-10-CM | POA: Diagnosis not present

## 2018-11-10 DIAGNOSIS — F250 Schizoaffective disorder, bipolar type: Secondary | ICD-10-CM | POA: Diagnosis not present

## 2018-11-10 DIAGNOSIS — F902 Attention-deficit hyperactivity disorder, combined type: Secondary | ICD-10-CM | POA: Diagnosis not present

## 2018-11-10 DIAGNOSIS — F411 Generalized anxiety disorder: Secondary | ICD-10-CM | POA: Diagnosis not present

## 2018-11-10 DIAGNOSIS — F4001 Agoraphobia with panic disorder: Secondary | ICD-10-CM | POA: Diagnosis not present

## 2018-11-13 ENCOUNTER — Telehealth: Payer: Self-pay | Admitting: Cardiology

## 2018-11-13 DIAGNOSIS — F4320 Adjustment disorder, unspecified: Secondary | ICD-10-CM | POA: Diagnosis not present

## 2018-11-13 DIAGNOSIS — F250 Schizoaffective disorder, bipolar type: Secondary | ICD-10-CM | POA: Diagnosis not present

## 2018-11-13 DIAGNOSIS — F411 Generalized anxiety disorder: Secondary | ICD-10-CM | POA: Diagnosis not present

## 2018-11-13 DIAGNOSIS — F5101 Primary insomnia: Secondary | ICD-10-CM | POA: Diagnosis not present

## 2018-11-13 MED ORDER — FUROSEMIDE 20 MG PO TABS: 20.0000 mg | ORAL_TABLET | Freq: Every day | ORAL | 0 refills | Status: DC | PRN

## 2018-11-13 MED ORDER — POTASSIUM CHLORIDE CRYS ER 20 MEQ PO TBCR: 20.0000 meq | EXTENDED_RELEASE_TABLET | Freq: Every day | ORAL | 0 refills | Status: DC

## 2018-11-13 NOTE — Telephone Encounter (Signed)
Advised patient, verbalized understanding. Advised patient if she feels she needs to take on a daily basis to call the office for further  recommendations.

## 2018-11-13 NOTE — Telephone Encounter (Signed)
Please renew Lasix 20 mg and K+ 20 MEQ QD, up to three times a week PRN edema. Dispense 90 of each. No refills  Cheryn Lundquist PA-C 11/13/2018 4:30 PM

## 2018-11-13 NOTE — Telephone Encounter (Signed)
Pt calling requesting a Rx be sent in for fluid. This pt is not on a fluid pill and would like for Dr. Stanford Breed to send in a new prescription for a fluid pill, pt stated that she has some fluid build up and would like a call back concerning this matter. Please address

## 2018-11-13 NOTE — Telephone Encounter (Signed)
Spoke with patient and she had swelling the last few days, yesterday could hardly walk secondary. Today swelling has improved. Patient denies shortness of breath. No change in eating, advised to avoid sodium. Patient does not monitor weight or blood pressure at home. Will forward to [place] for review

## 2018-11-13 NOTE — Telephone Encounter (Signed)
Tried calling patient, unable to leave message.

## 2018-12-08 ENCOUNTER — Telehealth: Payer: Self-pay | Admitting: *Deleted

## 2018-12-08 DIAGNOSIS — F4312 Post-traumatic stress disorder, chronic: Secondary | ICD-10-CM | POA: Diagnosis not present

## 2018-12-08 DIAGNOSIS — F410 Panic disorder [episodic paroxysmal anxiety] without agoraphobia: Secondary | ICD-10-CM | POA: Diagnosis not present

## 2018-12-08 DIAGNOSIS — F250 Schizoaffective disorder, bipolar type: Secondary | ICD-10-CM | POA: Diagnosis not present

## 2018-12-08 NOTE — Telephone Encounter (Signed)
Unable to leave a message, no voicemail.  

## 2018-12-18 DIAGNOSIS — F5101 Primary insomnia: Secondary | ICD-10-CM | POA: Diagnosis not present

## 2018-12-18 DIAGNOSIS — F250 Schizoaffective disorder, bipolar type: Secondary | ICD-10-CM | POA: Diagnosis not present

## 2018-12-18 DIAGNOSIS — F411 Generalized anxiety disorder: Secondary | ICD-10-CM | POA: Diagnosis not present

## 2018-12-18 DIAGNOSIS — F4320 Adjustment disorder, unspecified: Secondary | ICD-10-CM | POA: Diagnosis not present

## 2019-01-13 DIAGNOSIS — M25532 Pain in left wrist: Secondary | ICD-10-CM | POA: Diagnosis not present

## 2019-01-13 DIAGNOSIS — S52572A Other intraarticular fracture of lower end of left radius, initial encounter for closed fracture: Secondary | ICD-10-CM | POA: Diagnosis not present

## 2019-01-14 ENCOUNTER — Telehealth: Payer: Self-pay | Admitting: *Deleted

## 2019-01-14 NOTE — Telephone Encounter (Signed)
   Attempted to contact patient, however there was no answer or option to leave a voicemail. Surgery is scheduled for 01/16/2019. Hopefully someone from our office will be able to reach her tomorrow.   Abigail Butts, PA-C 01/14/19; 5:09 PM

## 2019-01-14 NOTE — Telephone Encounter (Signed)
   [facility] Pre-operative Risk Assessment    Request for surgical clearance:  1. What type of surgery is being performed? OPEN REDUCTION/INTERNAL FIXATION LEFT DISTAL RADIUS FRACTURE, LEFT CARPAL TUNNEL RELEASE    2. When is this surgery scheduled? 01/16/19   3. What type of clearance is required (medical clearance vs. Pharmacy clearance to hold med vs. Both)? MEDICAL   4. Are there any medications that need to be held prior to surgery and how long?   5. Practice name and name of physician performing surgery? [facility] [place]  6. What is your office phone number? [phone]     7.   What is your office fax number? [phone] [facility] [employer] [place]   8.   Anesthesia type (None, local, MAC, general) ? AXILLARY BLOCK, MAC

## 2019-01-15 NOTE — Telephone Encounter (Signed)
    Attempted contact once again without success. There was no option to leave a voicemail/contact number for call back. Her surgery is scheduled for tomorrow, 01/16/2019 and with her hx of CABG in 2018, would need to speak with the patient for symptom evaluation before full cardiac clearance can be given.   Kathyrn Drown NP-C [place] Pager: [phone]

## 2019-01-16 DIAGNOSIS — S52572A Other intraarticular fracture of lower end of left radius, initial encounter for closed fracture: Secondary | ICD-10-CM | POA: Diagnosis not present

## 2019-01-16 DIAGNOSIS — G5602 Carpal tunnel syndrome, left upper limb: Secondary | ICD-10-CM | POA: Diagnosis not present

## 2019-01-16 DIAGNOSIS — X58XXXA Exposure to other specified factors, initial encounter: Secondary | ICD-10-CM | POA: Diagnosis not present

## 2019-01-16 DIAGNOSIS — Y999 Unspecified external cause status: Secondary | ICD-10-CM | POA: Diagnosis not present

## 2019-01-16 NOTE — Telephone Encounter (Signed)
   Attempted to contact the patient for a third time but again was unsuccessful and there was no option to leave a voicemail and per patient wishes, she has requested not to have medical information given to anyone else. Surgery is scheduled for today.  I will remove the patient from the preo pool. Should surgery be rescheduled another request will need to be submitted.   Preop Callback: - Please contact the requesting surgeons office and notify them that we have been unable to speak with the patient, and are unable to provide a preoperative risk assessment. Should they chose to reschedule her surgery, they should re-submit a preop assessment request with an updated surgery date.

## 2019-01-16 NOTE — Telephone Encounter (Signed)
Called requesting office and spoke to scheduler. Gave information that we were unable to reach pt for cardiac clearance, and asked if they would go ahead with surgery without it. Stated if not, they will need to submit another request and we can attempt to contact pt again. Surgery may need to be rescheduled. Currently scheduled for today. Surgery scheduler stated she would pass the information along and verbalized thanks for the call.

## 2019-01-20 DIAGNOSIS — S52572D Other intraarticular fracture of lower end of left radius, subsequent encounter for closed fracture with routine healing: Secondary | ICD-10-CM | POA: Diagnosis not present

## 2019-01-20 DIAGNOSIS — S52572A Other intraarticular fracture of lower end of left radius, initial encounter for closed fracture: Secondary | ICD-10-CM | POA: Diagnosis not present

## 2019-01-20 DIAGNOSIS — M25532 Pain in left wrist: Secondary | ICD-10-CM | POA: Diagnosis not present

## 2019-01-20 DIAGNOSIS — S52571A Other intraarticular fracture of lower end of right radius, initial encounter for closed fracture: Secondary | ICD-10-CM | POA: Diagnosis not present

## 2019-01-20 DIAGNOSIS — M25632 Stiffness of left wrist, not elsewhere classified: Secondary | ICD-10-CM | POA: Diagnosis not present

## 2019-01-26 DIAGNOSIS — F410 Panic disorder [episodic paroxysmal anxiety] without agoraphobia: Secondary | ICD-10-CM | POA: Diagnosis not present

## 2019-01-26 DIAGNOSIS — F411 Generalized anxiety disorder: Secondary | ICD-10-CM | POA: Diagnosis not present

## 2019-01-26 DIAGNOSIS — F250 Schizoaffective disorder, bipolar type: Secondary | ICD-10-CM | POA: Diagnosis not present

## 2019-01-26 DIAGNOSIS — F4312 Post-traumatic stress disorder, chronic: Secondary | ICD-10-CM | POA: Diagnosis not present

## 2019-01-26 DIAGNOSIS — F902 Attention-deficit hyperactivity disorder, combined type: Secondary | ICD-10-CM | POA: Diagnosis not present

## 2019-02-04 ENCOUNTER — Other Ambulatory Visit: Payer: Self-pay | Admitting: Cardiology

## 2019-02-16 DIAGNOSIS — F411 Generalized anxiety disorder: Secondary | ICD-10-CM | POA: Diagnosis not present

## 2019-02-16 DIAGNOSIS — F250 Schizoaffective disorder, bipolar type: Secondary | ICD-10-CM | POA: Diagnosis not present

## 2019-02-16 DIAGNOSIS — F4320 Adjustment disorder, unspecified: Secondary | ICD-10-CM | POA: Diagnosis not present

## 2019-02-16 DIAGNOSIS — F5101 Primary insomnia: Secondary | ICD-10-CM | POA: Diagnosis not present

## 2019-03-16 DIAGNOSIS — F4320 Adjustment disorder, unspecified: Secondary | ICD-10-CM | POA: Diagnosis not present

## 2019-03-16 DIAGNOSIS — F250 Schizoaffective disorder, bipolar type: Secondary | ICD-10-CM | POA: Diagnosis not present

## 2019-03-16 DIAGNOSIS — F5101 Primary insomnia: Secondary | ICD-10-CM | POA: Diagnosis not present

## 2019-03-16 DIAGNOSIS — F411 Generalized anxiety disorder: Secondary | ICD-10-CM | POA: Diagnosis not present

## 2019-03-26 DIAGNOSIS — F410 Panic disorder [episodic paroxysmal anxiety] without agoraphobia: Secondary | ICD-10-CM | POA: Diagnosis not present

## 2019-03-26 DIAGNOSIS — F250 Schizoaffective disorder, bipolar type: Secondary | ICD-10-CM | POA: Diagnosis not present

## 2019-03-26 DIAGNOSIS — F4312 Post-traumatic stress disorder, chronic: Secondary | ICD-10-CM | POA: Diagnosis not present

## 2019-04-13 DIAGNOSIS — F5101 Primary insomnia: Secondary | ICD-10-CM | POA: Diagnosis not present

## 2019-04-13 DIAGNOSIS — F4320 Adjustment disorder, unspecified: Secondary | ICD-10-CM | POA: Diagnosis not present

## 2019-04-13 DIAGNOSIS — F250 Schizoaffective disorder, bipolar type: Secondary | ICD-10-CM | POA: Diagnosis not present

## 2019-04-13 DIAGNOSIS — F411 Generalized anxiety disorder: Secondary | ICD-10-CM | POA: Diagnosis not present

## 2019-05-25 DIAGNOSIS — F250 Schizoaffective disorder, bipolar type: Secondary | ICD-10-CM | POA: Diagnosis not present

## 2019-05-25 DIAGNOSIS — F410 Panic disorder [episodic paroxysmal anxiety] without agoraphobia: Secondary | ICD-10-CM | POA: Diagnosis not present

## 2019-05-25 DIAGNOSIS — F4312 Post-traumatic stress disorder, chronic: Secondary | ICD-10-CM | POA: Diagnosis not present

## 2019-05-25 DIAGNOSIS — F902 Attention-deficit hyperactivity disorder, combined type: Secondary | ICD-10-CM | POA: Diagnosis not present

## 2019-05-25 DIAGNOSIS — F411 Generalized anxiety disorder: Secondary | ICD-10-CM | POA: Diagnosis not present

## 2019-06-04 DIAGNOSIS — F250 Schizoaffective disorder, bipolar type: Secondary | ICD-10-CM | POA: Diagnosis not present

## 2019-06-04 DIAGNOSIS — F411 Generalized anxiety disorder: Secondary | ICD-10-CM | POA: Diagnosis not present

## 2019-06-04 DIAGNOSIS — F5101 Primary insomnia: Secondary | ICD-10-CM | POA: Diagnosis not present

## 2019-06-04 DIAGNOSIS — F4320 Adjustment disorder, unspecified: Secondary | ICD-10-CM | POA: Diagnosis not present

## 2019-07-03 ENCOUNTER — Encounter: Payer: Self-pay | Admitting: General Practice

## 2019-07-07 DIAGNOSIS — F250 Schizoaffective disorder, bipolar type: Secondary | ICD-10-CM | POA: Diagnosis not present

## 2019-07-07 DIAGNOSIS — F411 Generalized anxiety disorder: Secondary | ICD-10-CM | POA: Diagnosis not present

## 2019-07-07 DIAGNOSIS — F5101 Primary insomnia: Secondary | ICD-10-CM | POA: Diagnosis not present

## 2019-07-07 DIAGNOSIS — F4320 Adjustment disorder, unspecified: Secondary | ICD-10-CM | POA: Diagnosis not present

## 2019-07-13 DIAGNOSIS — F4312 Post-traumatic stress disorder, chronic: Secondary | ICD-10-CM | POA: Diagnosis not present

## 2019-07-13 DIAGNOSIS — F250 Schizoaffective disorder, bipolar type: Secondary | ICD-10-CM | POA: Diagnosis not present

## 2019-07-13 DIAGNOSIS — F902 Attention-deficit hyperactivity disorder, combined type: Secondary | ICD-10-CM | POA: Diagnosis not present

## 2019-07-13 DIAGNOSIS — F410 Panic disorder [episodic paroxysmal anxiety] without agoraphobia: Secondary | ICD-10-CM | POA: Diagnosis not present

## 2019-07-13 DIAGNOSIS — F411 Generalized anxiety disorder: Secondary | ICD-10-CM | POA: Diagnosis not present

## 2020-01-01 ENCOUNTER — Telehealth: Payer: Self-pay | Admitting: Family Medicine

## 2020-01-01 NOTE — Telephone Encounter (Signed)
Jefferson [employer] called wanting to know if we can schedule her mom to establish care with you. She would like for her to be scheduled the same day that she is coming in to see you on 08/18.  Please advise

## 2020-01-01 NOTE — Telephone Encounter (Signed)
Sorry but I am too full to see her

## 2020-01-01 NOTE — Telephone Encounter (Signed)
Patient is scheduled with Dorothyann Peng on 01/13/2020 at 2:30 PM. NPP sent 01/01/2020

## 2020-01-13 ENCOUNTER — Ambulatory Visit: Payer: PPO | Admitting: Adult Health

## 2020-01-13 DIAGNOSIS — Z0289 Encounter for other administrative examinations: Secondary | ICD-10-CM

## 2020-01-13 NOTE — Progress Notes (Deleted)
Patient presents to clinic today to establish care. She is a pleasant 59 year old female who  has a past medical history of ABDOMINAL PAIN RIGHT LOWER QUADRANT (07/26/2009), Abdominal pain, left lower quadrant (07/26/2009), ABDOMINAL PAIN, LOWER (11/29/2009), ANEMIA-B12 DEFICIENCY (07/11/2009), ANEMIA-IRON DEFICIENCY (06/21/2009), ANXIETY (02/06/2007), Arthritis, BACK PAIN (07/01/2007), Benzodiazepine overdose (12/03/2011), CAD (coronary artery disease), CHEST PAIN (06/21/2009), DEPRESSION (02/06/2007), DISC DISEASE, CERVICAL (05/03/2009), DYSPEPSIA (10/23/2007), EAR PAIN, RIGHT (11/29/2009), ELBOW PAIN, RIGHT (06/27/2010), ELEVATED BLOOD PRESSURE WITHOUT DIAGNOSIS OF HYPERTENSION (07/01/2007), FEVER UNSPECIFIED (06/03/2008), FREQUENCY, URINARY (03/12/2007), Genital herpes (01/03/2011), GERD (07/11/2009), Glossitis (10/23/2007), HEAD TRAUMA, CLOSED (11/29/2009), History of surgery for cerebral aneurysm (2015), HYPERLIPIDEMIA (02/06/2007), Insomnia, unspecified (02/06/2007), LATERAL EPICONDYLITIS, LEFT (06/03/2008), LOW BACK PAIN (02/06/2007), Major depression (11/16/2010), MIGRAINE HEADACHE (02/06/2007), OSTEOARTHRITIS (02/06/2007), OSTEOARTHRITIS, CERVICAL SPINE (03/12/2007), OTITIS EXTERNA, RIGHT (10/04/2009), Stroke ([place]) (2016-2017 X 3), and VITAMIN B12 DEFICIENCY (07/26/2009).   Acute Concerns: Establish Care  Chronic Issues: Schizoaffective disorder, bipolar type - is seen by psychiatry..  And currently prescribed Paxil 40 mg daily, Seroquel 200 mg nightly, and Effexor 75 mg extended release daily.  CAD -he has been seen by cardiology in the past.  Most recent cath on 11/2016 revealing severe two-vessel disease, ultimately leading to CABG on 12/17/2016 with LIMA to LAD, SVG to D1, and segmental SVG to PDA to PL.  She was on metoprolol at one point in time, but took herself off of this stating that it was making her too tired.  Hypercholesterolemia-currently taking a statin.  It has been recommended to her by cardiology but she has no  interest in taking this therapy. Lab Results  Component Value Date   CHOL 127 02/18/2017   HDL 43 02/18/2017   LDLCALC 47 02/18/2017   LDLDIRECT 189.4 06/27/2010   TRIG 184 (H) 02/18/2017   CHOLHDL 3.0 02/18/2017   Health Maintenance: Dental -- Vision -- Immunizations -- Colonoscopy -- Last was in 2011-10 year plan  Mammogram -- last was in 2017  PAP --  Bone Density --    Past Medical History:  Diagnosis Date   ABDOMINAL PAIN RIGHT LOWER QUADRANT 07/26/2009   Abdominal pain, left lower quadrant 07/26/2009   ABDOMINAL PAIN, LOWER 11/29/2009   ANEMIA-B12 DEFICIENCY 07/11/2009   ANEMIA-IRON DEFICIENCY 06/21/2009   ANXIETY 02/06/2007   Arthritis    "back" (12/12/2016)   BACK PAIN 07/01/2007   Benzodiazepine overdose 12/03/2011   July 2013   CAD (coronary artery disease)    a. LHC 12/12/16: Multivessel CAD b. s/p CABG on 12/17/2016 with LIMA-LAD, SVG-D1, and Seq SVG-PDA-PL   CHEST PAIN 06/21/2009   DEPRESSION 02/06/2007   [place] DISEASE, CERVICAL 05/03/2009   DYSPEPSIA 10/23/2007   EAR PAIN, RIGHT 11/29/2009   ELBOW PAIN, RIGHT 06/27/2010   ELEVATED BLOOD PRESSURE WITHOUT DIAGNOSIS OF HYPERTENSION 07/01/2007   FEVER UNSPECIFIED 06/03/2008   FREQUENCY, URINARY 03/12/2007   Genital herpes 01/03/2011   GERD 07/11/2009   Glossitis 10/23/2007   HEAD TRAUMA, CLOSED 11/29/2009   History of surgery for cerebral aneurysm 2015   HYPERLIPIDEMIA 02/06/2007   Insomnia, unspecified 02/06/2007   LATERAL EPICONDYLITIS, LEFT 06/03/2008   LOW BACK PAIN 02/06/2007   Major depression 11/16/2010   MIGRAINE HEADACHE 02/06/2007   "used to get them all the time; stopped a few years ago" (12/12/2016)   OSTEOARTHRITIS 02/06/2007   OSTEOARTHRITIS, CERVICAL SPINE 03/12/2007   OTITIS EXTERNA, RIGHT 10/04/2009   Stroke ([place]) 2016-2017 X 3   RLE "goes to the side a little bit when I walk" (12/12/2016)  VITAMIN B12 DEFICIENCY 07/26/2009    Past Surgical History:  Procedure Laterality Date    ABDOMINAL HYSTERECTOMY  2005   "still have my ovaries"   ANKLE FRACTURE SURGERY Right ?[place] DECOMP/DISCECTOMY FUSION  2008   C5-6   APPENDECTOMY  04/2001   lap appy/notes 10/11/2010   BACK SURGERY     CARDIAC CATHETERIZATION  12/12/2016   CEREBRAL ANEURYSM REPAIR     CORONARY ARTERY BYPASS GRAFT N/A 12/17/2016   Procedure: CORONARY ARTERY BYPASS GRAFTING (CABG) x 4, using left internal mammary artery and right greater saphenous vein harvested endoscopically;  Surgeon: Gaye Pollack, MD;  Location: [facility] OR;  Service: Open Heart Surgery;  Laterality: N/A;   CYSTOSCOPY W/ URETEROSCOPY  02/17/2002    Cystoscopy, right retrograde pyelogram with interpretation, left ureteroscopy with holmium and right ureteroscopy/notes 10/11/2010   INTRAOPERATIVE TRANSESOPHAGEAL ECHOCARDIOGRAM N/A 12/17/2016   Procedure: INTRAOPERATIVE TRANSESOPHAGEAL ECHOCARDIOGRAM;  Surgeon: Gaye Pollack, MD;  Location: [facility] OR;  Service: Open Heart Surgery;  Laterality: N/A;   LEFT HEART CATH AND CORONARY ANGIOGRAPHY N/A 12/12/2016   Procedure: Left Heart Cath and Coronary Angiography;  Surgeon: Jettie Booze, MD;  Location: [facility];  Service: Cardiovascular;  Laterality: N/A;   TUBAL LIGATION      Current Outpatient Medications on File Prior to Visit  Medication Sig Dispense Refill   aspirin 81 MG tablet Take 4 tablets (325 mg total) by mouth daily.     furosemide (LASIX) 20 MG tablet TAKE 1 TABLET (20 MG TOTAL) BY MOUTH DAILY AS NEEDED FOR FLUID OR EDEMA. 90 tablet 0   gabapentin (NEURONTIN) 400 MG capsule 2 CAPSULES BY MOUTH 3 TIMES A DAY     Multiple Vitamin (MULTIVITAMIN WITH MINERALS) TABS tablet Take 1 tablet by mouth daily.     PARoxetine (PAXIL) 40 MG tablet Take 40 mg by mouth daily.  2   Potassium Chloride ER 20 MEQ TBCR TAKE 1 TABLET (20 MEQ TOTAL) BY MOUTH DAILY. ON DAYS YOU TAKE FUROSEMIDE (LASIX) 90 tablet 0   QUEtiapine (SEROQUEL) 200 MG tablet Take 200 mg  by mouth at bedtime.     venlafaxine XR (EFFEXOR-XR) 75 MG 24 hr capsule 1 CAPSULE BY MOUTH 1 TIMES A DAY     No current facility-administered medications on file prior to visit.    Allergies  Allergen Reactions   Citalopram Other (See Comments)    "Feels wierd"   Diclofenac Sodium Nausea Only    GI Upset   Fluoxetine Hcl Nausea Only   Ibuprofen Nausea Only   Lamictal [Lamotrigine] Rash    Family History  Problem Relation Age of Onset   Diabetes Other    Hypertension Other    Coronary artery disease Other 5       Female 1st degree relative   Cancer - Lung Father    Cirrhosis Brother    Diabetes Brother     Social History   Socioeconomic History   Marital status: Widowed    Spouse name: Not on file   Number of children: Not on file   Years of education: Not on file   Highest education level: Not on file  Occupational History   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    Employer: [employer]  Tobacco Use   Smoking status: Former Smoker    Packs/day: 1.50    Years: 42.00    Pack years: 63.00    Types: Cigarettes    Quit date:  11/14/2016    Years since quitting: 3.1   Smokeless tobacco: Never Used  Vaping Use   Vaping Use: Never used  Substance and Sexual Activity   Alcohol use: Yes    Comment: 12/12/2016 "2 drinks per month"   Drug use: No   Sexual activity: Never  Other Topics Concern   Not on file  Social History Narrative   Currently living apart from husband   Social Determinants of Health   Financial Resource Strain:    Difficulty of Paying Living Expenses:   Food Insecurity:    Worried About Charity fundraiser in the Last Year:    Arboriculturist in the Last Year:   Transportation Needs:    Film/video editor (Medical):    Lack of Transportation (Non-Medical):   Physical Activity:    Days of Exercise per Week:    Minutes of Exercise per Session:   Stress:    Feeling of Stress :     Social Connections:    Frequency of Communication with Friends and Family:    Frequency of Social Gatherings with Friends and Family:    Attends Religious Services:    Active Member of Clubs or Organizations:    Attends Music therapist:    Marital Status:   Intimate Partner Violence:    Fear of Current or Ex-Partner:    Emotionally Abused:    Physically Abused:    Sexually Abused:     Review of Systems  Constitutional: Negative.   Eyes: Negative.   Respiratory: Negative.   Cardiovascular: Negative.   Gastrointestinal: Negative.   Musculoskeletal: Negative.   Skin: Negative.   Neurological: Negative.   Endo/Heme/Allergies: Negative.   Psychiatric/Behavioral: Negative.   All other systems reviewed and are negative.   There were no vitals taken for this visit.  Physical Exam Vitals and nursing note reviewed.  Constitutional:      General: She is not in acute distress.    Appearance: Normal appearance. She is well-developed. She is not ill-appearing.  HENT:     Head: Normocephalic and atraumatic.     Right Ear: Tympanic membrane, ear canal and external ear normal. There is no impacted cerumen.     Left Ear: Tympanic membrane, ear canal and external ear normal. There is no impacted cerumen.     Nose: Nose normal. No congestion or rhinorrhea.     Mouth/Throat:     Mouth: Mucous membranes are moist.     Pharynx: Oropharynx is clear. No oropharyngeal exudate or posterior oropharyngeal erythema.  Eyes:     General:        Right eye: No discharge.        Left eye: No discharge.     Extraocular Movements: Extraocular movements intact.     Conjunctiva/sclera: Conjunctivae normal.     Pupils: Pupils are equal, round, and reactive to light.  Neck:     Thyroid: No thyromegaly.     Vascular: No carotid bruit.     Trachea: No tracheal deviation.  Cardiovascular:     Rate and Rhythm: Normal rate and regular rhythm.     Pulses: Normal pulses.     Heart  sounds: Normal heart sounds. No murmur heard.  No friction rub. No gallop.   Pulmonary:     Effort: Pulmonary effort is normal. No respiratory distress.     Breath sounds: Normal breath sounds. No stridor. No wheezing, rhonchi or rales.  Chest:     Chest wall:  No tenderness.  Abdominal:     General: Abdomen is flat. Bowel sounds are normal. There is no distension.     Palpations: Abdomen is soft. There is no mass.     Tenderness: There is no abdominal tenderness. There is no right CVA tenderness, left CVA tenderness, guarding or rebound.     Hernia: No hernia is present.  Musculoskeletal:        General: No swelling, tenderness, deformity or signs of injury. Normal range of motion.     Cervical back: Normal range of motion and neck supple.     Right lower leg: No edema.     Left lower leg: No edema.  Lymphadenopathy:     Cervical: No cervical adenopathy.  Skin:    General: Skin is warm and dry.     Coloration: Skin is not jaundiced or pale.     Findings: No bruising, erythema, lesion or rash.  Neurological:     General: No focal deficit present.     Mental Status: She is alert and oriented to person, place, and time.     Cranial Nerves: No cranial nerve deficit.     Sensory: No sensory deficit.     Motor: No weakness.     Coordination: Coordination normal.     Gait: Gait normal.     Deep Tendon Reflexes: Reflexes normal.  Psychiatric:        Mood and Affect: Mood normal.        Behavior: Behavior normal.        Thought Content: Thought content normal.        Judgment: Judgment normal.     No results found for this or any previous visit (from the past 2160 hour(s)).  Assessment/Plan: No problem-specific Assessment & Plan notes found for this encounter.

## 2020-01-25 ENCOUNTER — Encounter: Payer: Self-pay | Admitting: Family Medicine

## 2020-01-25 ENCOUNTER — Other Ambulatory Visit: Payer: Self-pay

## 2020-01-25 ENCOUNTER — Ambulatory Visit (INDEPENDENT_AMBULATORY_CARE_PROVIDER_SITE_OTHER): Payer: PPO | Admitting: Family Medicine

## 2020-01-25 VITALS — BP 128/88 | HR 111 | Temp 97.5°F | Wt 137.5 lb

## 2020-01-25 DIAGNOSIS — L989 Disorder of the skin and subcutaneous tissue, unspecified: Secondary | ICD-10-CM

## 2020-01-25 DIAGNOSIS — F314 Bipolar disorder, current episode depressed, severe, without psychotic features: Secondary | ICD-10-CM | POA: Diagnosis not present

## 2020-01-25 NOTE — Progress Notes (Signed)
   Subjective:    Patient ID: Michelle [name], female    DOB: November 06, 1960, 59 y.o.   MRN: [id number]  HPI Chief Complaint  Patient presents with  . spot on forehead    spot on forehead, itching for x 2 months   She is here with concerns regarding a lesion on her forehead.  States it has been there for several months but has been getting larger and itching over the past 2 months.  States it is along the edge of the incision her dermatologist performed for skin cancer.  She was seen in the past few months at [facility] dermatology.   She is also requesting that I treat her anxiety and depression. States she had a psychiatrist at the [facility] but has not been there in several months.  She would like to get started on some of her medications again.  States she does not currently have a psychiatrist.  She denies any thoughts of self-harm.  Denies fever, chills, dizziness, chest pain, palpitations, shortness of breath, abdominal pain, N/V/D.   Reviewed allergies, medications, past medical, surgical, family, and social history.    Review of Systems Pertinent positives and negatives in the history of present illness.     Objective:   Physical Exam Constitutional:      Appearance: Normal appearance.  HENT:     Head:      Comments: Erythematous lesion with scaling to her right forehead. This is located along a scar from previous skin cancer surgery.  Neurological:     Mental Status: She is alert.  Psychiatric:        Attention and Perception: Attention and perception normal.        Mood and Affect: Mood normal.        Speech: Speech normal.        Behavior: Behavior normal.        Thought Content: Thought content normal. Thought content does not include suicidal ideation.    BP 128/88   Pulse (!) 111   Temp (!) 97.5 F (36.4 C)   Wt 137 lb 8 oz (62.4 kg)   BMI 25.15 kg/m       Assessment & Plan:  Skin lesion  Severe bipolar I disorder, current or most recent episode  depressed ([place])  I will provide her with phone numbers for [facility] dermatology and I recommend that she call and schedule a visit in the very near future.  Lesion location is concerning due to history of skin cancer. States [facility].  Discussed that I do not treat severe bipolar or schizoaffective disorder and that she also has a history of benzodiazepine abuse.  I will refer her to the new [facility] behavioral health outpatient clinic.  I provided her with a phone number to call and schedule with a psychiatrist.  She agrees with this.

## 2020-01-25 NOTE — Patient Instructions (Addendum)
Please call and schedule:   [facility]  [phone]   [facility]  Ask for a psychiatrist appointment  [phone] or [phone]

## 2020-03-08 ENCOUNTER — Telehealth: Payer: Self-pay

## 2020-03-08 NOTE — Telephone Encounter (Signed)
Patient used to be in your care, patient was last seen by you on 02/04/2012 and was wondering if it was okay if she could re-establish care with you.

## 2020-03-08 NOTE — Telephone Encounter (Signed)
Patient changed mind and no longer wants to re-establish care

## 2020-03-21 DIAGNOSIS — R202 Paresthesia of skin: Secondary | ICD-10-CM | POA: Diagnosis not present

## 2020-03-21 DIAGNOSIS — F3341 Major depressive disorder, recurrent, in partial remission: Secondary | ICD-10-CM | POA: Diagnosis not present

## 2020-03-21 DIAGNOSIS — Z131 Encounter for screening for diabetes mellitus: Secondary | ICD-10-CM | POA: Diagnosis not present

## 2020-03-21 DIAGNOSIS — Z720 Tobacco use: Secondary | ICD-10-CM | POA: Diagnosis not present

## 2020-03-21 DIAGNOSIS — R030 Elevated blood-pressure reading, without diagnosis of hypertension: Secondary | ICD-10-CM | POA: Diagnosis not present

## 2020-03-21 DIAGNOSIS — R9431 Abnormal electrocardiogram [ECG] [EKG]: Secondary | ICD-10-CM | POA: Diagnosis not present

## 2020-03-21 DIAGNOSIS — Z8673 Personal history of transient ischemic attack (TIA), and cerebral infarction without residual deficits: Secondary | ICD-10-CM | POA: Diagnosis not present

## 2020-03-21 DIAGNOSIS — I872 Venous insufficiency (chronic) (peripheral): Secondary | ICD-10-CM | POA: Diagnosis not present

## 2020-03-21 DIAGNOSIS — Z136 Encounter for screening for cardiovascular disorders: Secondary | ICD-10-CM | POA: Diagnosis not present

## 2020-03-21 DIAGNOSIS — Z0000 Encounter for general adult medical examination without abnormal findings: Secondary | ICD-10-CM | POA: Diagnosis not present

## 2020-04-29 ENCOUNTER — Emergency Department (HOSPITAL_BASED_OUTPATIENT_CLINIC_OR_DEPARTMENT_OTHER): Payer: PPO

## 2020-04-29 ENCOUNTER — Emergency Department (HOSPITAL_COMMUNITY): Payer: PPO

## 2020-04-29 ENCOUNTER — Encounter (HOSPITAL_COMMUNITY): Payer: Self-pay

## 2020-04-29 ENCOUNTER — Other Ambulatory Visit: Payer: Self-pay

## 2020-04-29 ENCOUNTER — Emergency Department (HOSPITAL_COMMUNITY)
Admission: EM | Admit: 2020-04-29 | Discharge: 2020-04-29 | Disposition: A | Discharge status: Home / Self Care | Payer: PPO | Attending: Emergency Medicine | Admitting: Emergency Medicine

## 2020-04-29 DIAGNOSIS — N200 Calculus of kidney: Secondary | ICD-10-CM | POA: Diagnosis not present

## 2020-04-29 DIAGNOSIS — K7689 Other specified diseases of liver: Secondary | ICD-10-CM | POA: Insufficient documentation

## 2020-04-29 DIAGNOSIS — R1110 Vomiting, unspecified: Secondary | ICD-10-CM | POA: Diagnosis not present

## 2020-04-29 DIAGNOSIS — R112 Nausea with vomiting, unspecified: Secondary | ICD-10-CM | POA: Diagnosis not present

## 2020-04-29 DIAGNOSIS — Z951 Presence of aortocoronary bypass graft: Secondary | ICD-10-CM | POA: Insufficient documentation

## 2020-04-29 DIAGNOSIS — W19XXXA Unspecified fall, initial encounter: Secondary | ICD-10-CM

## 2020-04-29 DIAGNOSIS — I10 Essential (primary) hypertension: Secondary | ICD-10-CM | POA: Diagnosis not present

## 2020-04-29 DIAGNOSIS — K449 Diaphragmatic hernia without obstruction or gangrene: Secondary | ICD-10-CM | POA: Diagnosis not present

## 2020-04-29 DIAGNOSIS — Z8673 Personal history of transient ischemic attack (TIA), and cerebral infarction without residual deficits: Secondary | ICD-10-CM | POA: Insufficient documentation

## 2020-04-29 DIAGNOSIS — M79609 Pain in unspecified limb: Secondary | ICD-10-CM

## 2020-04-29 DIAGNOSIS — Z7982 Long term (current) use of aspirin: Secondary | ICD-10-CM | POA: Insufficient documentation

## 2020-04-29 DIAGNOSIS — R52 Pain, unspecified: Secondary | ICD-10-CM | POA: Diagnosis not present

## 2020-04-29 DIAGNOSIS — Z87891 Personal history of nicotine dependence: Secondary | ICD-10-CM | POA: Diagnosis not present

## 2020-04-29 DIAGNOSIS — Z20822 Contact with and (suspected) exposure to covid-19: Secondary | ICD-10-CM | POA: Insufficient documentation

## 2020-04-29 DIAGNOSIS — I2510 Atherosclerotic heart disease of native coronary artery without angina pectoris: Secondary | ICD-10-CM | POA: Diagnosis not present

## 2020-04-29 DIAGNOSIS — F314 Bipolar disorder, current episode depressed, severe, without psychotic features: Secondary | ICD-10-CM | POA: Diagnosis not present

## 2020-04-29 DIAGNOSIS — I160 Hypertensive urgency: Secondary | ICD-10-CM

## 2020-04-29 DIAGNOSIS — R531 Weakness: Secondary | ICD-10-CM | POA: Insufficient documentation

## 2020-04-29 DIAGNOSIS — R102 Pelvic and perineal pain: Secondary | ICD-10-CM | POA: Diagnosis not present

## 2020-04-29 DIAGNOSIS — R1111 Vomiting without nausea: Secondary | ICD-10-CM | POA: Diagnosis not present

## 2020-04-29 DIAGNOSIS — R109 Unspecified abdominal pain: Secondary | ICD-10-CM | POA: Diagnosis present

## 2020-04-29 DIAGNOSIS — R110 Nausea: Secondary | ICD-10-CM | POA: Diagnosis not present

## 2020-04-29 DIAGNOSIS — G9389 Other specified disorders of brain: Secondary | ICD-10-CM | POA: Diagnosis not present

## 2020-04-29 DIAGNOSIS — R1032 Left lower quadrant pain: Secondary | ICD-10-CM

## 2020-04-29 DIAGNOSIS — Z043 Encounter for examination and observation following other accident: Secondary | ICD-10-CM | POA: Diagnosis not present

## 2020-04-29 DIAGNOSIS — Z79899 Other long term (current) drug therapy: Secondary | ICD-10-CM | POA: Diagnosis not present

## 2020-04-29 DIAGNOSIS — I671 Cerebral aneurysm, nonruptured: Secondary | ICD-10-CM | POA: Diagnosis not present

## 2020-04-29 LAB — RESP PANEL BY RT-PCR (FLU A&B, COVID) ARPGX2
Influenza A by PCR: NEGATIVE
Influenza B by PCR: NEGATIVE
SARS Coronavirus 2 by RT PCR: NEGATIVE

## 2020-04-29 LAB — URINALYSIS, ROUTINE W REFLEX MICROSCOPIC
Bacteria, UA: NONE SEEN
Bilirubin Urine: NEGATIVE
Glucose, UA: NEGATIVE mg/dL
Ketones, ur: 5 mg/dL — AB
Leukocytes,Ua: NEGATIVE
Nitrite: NEGATIVE
Protein, ur: NEGATIVE mg/dL
Specific Gravity, Urine: 1.036 — ABNORMAL HIGH (ref 1.005–1.030)
pH: 8 (ref 5.0–8.0)

## 2020-04-29 LAB — CBC WITH DIFFERENTIAL/PLATELET
Abs Immature Granulocytes: 0.07 10*3/uL (ref 0.00–0.07)
Basophils Absolute: 0.1 10*3/uL (ref 0.0–0.1)
Basophils Relative: 1 %
Eosinophils Absolute: 0.1 10*3/uL (ref 0.0–0.5)
Eosinophils Relative: 1 %
HCT: 42.3 % (ref 36.0–46.0)
Hemoglobin: 13.5 g/dL (ref 12.0–15.0)
Immature Granulocytes: 0 %
Lymphocytes Relative: 12 %
Lymphs Abs: 1.9 10*3/uL (ref 0.7–4.0)
MCH: 29.6 pg (ref 26.0–34.0)
MCHC: 31.9 g/dL (ref 30.0–36.0)
MCV: 92.8 fL (ref 80.0–100.0)
Monocytes Absolute: 0.7 10*3/uL (ref 0.1–1.0)
Monocytes Relative: 5 %
Neutro Abs: 13 10*3/uL — ABNORMAL HIGH (ref 1.7–7.7)
Neutrophils Relative %: 81 %
Platelets: 322 10*3/uL (ref 150–400)
RBC: 4.56 MIL/uL (ref 3.87–5.11)
RDW: 13.1 % (ref 11.5–15.5)
WBC: 15.8 10*3/uL — ABNORMAL HIGH (ref 4.0–10.5)
nRBC: 0 % (ref 0.0–0.2)

## 2020-04-29 LAB — COMPREHENSIVE METABOLIC PANEL
ALT: 12 U/L (ref 0–44)
AST: 23 U/L (ref 15–41)
Albumin: 3.6 g/dL (ref 3.5–5.0)
Alkaline Phosphatase: 101 U/L (ref 38–126)
Anion gap: 13 (ref 5–15)
BUN: 10 mg/dL (ref 6–20)
CO2: 19 mmol/L — ABNORMAL LOW (ref 22–32)
Calcium: 8.6 mg/dL — ABNORMAL LOW (ref 8.9–10.3)
Chloride: 109 mmol/L (ref 98–111)
Creatinine, Ser: 0.59 mg/dL (ref 0.44–1.00)
GFR, Estimated: >60 mL/min (ref >60)
Glucose, Bld: 105 mg/dL — ABNORMAL HIGH (ref 70–99)
Potassium: 3.7 mmol/L (ref 3.5–5.1)
Sodium: 141 mmol/L (ref 135–145)
Total Bilirubin: 0.8 mg/dL (ref 0.3–1.2)
Total Protein: 6.3 g/dL — ABNORMAL LOW (ref 6.5–8.1)

## 2020-04-29 LAB — TROPONIN I (HIGH SENSITIVITY)
Troponin I (High Sensitivity): 3 ng/L (ref <18)
Troponin I (High Sensitivity): 5 ng/L (ref <18)

## 2020-04-29 LAB — PROTIME-INR
INR: 0.9 (ref 0.8–1.2)
Prothrombin Time: 12.1 seconds (ref 11.4–15.2)

## 2020-04-29 LAB — LACTIC ACID, PLASMA
Lactic Acid, Venous: 2.2 mmol/L (ref 0.5–1.9)
Lactic Acid, Venous: 1.3 mmol/L (ref 0.5–1.9)

## 2020-04-29 LAB — TSH: TSH: 2.3 u[IU]/mL (ref 0.350–4.500)

## 2020-04-29 LAB — LIPASE, BLOOD: Lipase: 24 U/L (ref 11–51)

## 2020-04-29 LAB — PHOSPHORUS: Phosphorus: 1.8 mg/dL — ABNORMAL LOW (ref 2.5–4.6)

## 2020-04-29 LAB — CK: Total CK: 62 U/L (ref 38–234)

## 2020-04-29 LAB — MAGNESIUM: Magnesium: 1.9 mg/dL (ref 1.7–2.4)

## 2020-04-29 MED ORDER — HYDROMORPHONE HCL 1 MG/ML IJ SOLN
2.0000 mg | Freq: Once | INTRAMUSCULAR | Status: AC
  Administered 2020-04-29: 2 mg via INTRAVENOUS
  Filled 2020-04-29: qty 2

## 2020-04-29 MED ORDER — HYDROMORPHONE HCL 1 MG/ML IJ SOLN
INTRAMUSCULAR | Status: AC
  Administered 2020-04-29: 1 mg via INTRAVENOUS
  Filled 2020-04-29: qty 1

## 2020-04-29 MED ORDER — IOHEXOL 350 MG/ML SOLN
100.0000 mL | Freq: Once | INTRAVENOUS | Status: AC | PRN
  Administered 2020-04-29: 100 mL via INTRAVENOUS

## 2020-04-29 MED ORDER — ONDANSETRON HCL 4 MG/2ML IJ SOLN
4.0000 mg | Freq: Once | INTRAMUSCULAR | Status: AC
  Administered 2020-04-29: 4 mg via INTRAVENOUS
  Filled 2020-04-29: qty 2

## 2020-04-29 MED ORDER — HYDRALAZINE HCL 25 MG PO TABS: 25.0000 mg | ORAL_TABLET | Freq: Three times a day (TID) | ORAL | 0 refills | Status: DC | PRN

## 2020-04-29 MED ORDER — HYDROCODONE-ACETAMINOPHEN 5-325 MG PO TABS
2.0000 | ORAL_TABLET | ORAL | 0 refills | Status: DC | PRN
Start: 2020-04-29 — End: 2023-06-06

## 2020-04-29 MED ORDER — AMLODIPINE BESYLATE 5 MG PO TABS
10.0000 mg | ORAL_TABLET | Freq: Once | ORAL | Status: AC
  Administered 2020-04-29: 10 mg via ORAL
  Filled 2020-04-29: qty 2

## 2020-04-29 MED ORDER — VENLAFAXINE HCL ER 75 MG PO CP24: 75.0000 mg | ORAL_CAPSULE | Freq: Every day | ORAL | 0 refills | Status: AC

## 2020-04-29 MED ORDER — LACTATED RINGERS IV BOLUS
500.0000 mL | Freq: Once | INTRAVENOUS | Status: AC
  Administered 2020-04-29: 500 mL via INTRAVENOUS

## 2020-04-29 MED ORDER — HYDROMORPHONE HCL 1 MG/ML IJ SOLN
1.0000 mg | INTRAMUSCULAR | Status: DC | PRN
  Administered 2020-04-29: 1 mg via INTRAVENOUS
  Filled 2020-04-29: qty 1

## 2020-04-29 MED ORDER — METOPROLOL TARTRATE 5 MG/5ML IV SOLN
5.0000 mg | Freq: Once | INTRAVENOUS | Status: AC
  Administered 2020-04-29: 5 mg via INTRAVENOUS
  Filled 2020-04-29: qty 5

## 2020-04-29 MED ORDER — ONDANSETRON HCL 4 MG/2ML IJ SOLN: 4.0000 mg | INTRAMUSCULAR | Status: DC | PRN

## 2020-04-29 MED ORDER — ONDANSETRON 4 MG PO TBDP: 4.0000 mg | ORAL_TABLET | ORAL | 0 refills | Status: DC | PRN

## 2020-04-29 MED ORDER — AMLODIPINE BESYLATE 10 MG PO TABS: 10.0000 mg | ORAL_TABLET | Freq: Every day | ORAL | 0 refills | Status: DC

## 2020-04-29 NOTE — ED Notes (Signed)
Patient verbalizes understanding of discharge instructions. Opportunity for questioning and answers were provided. Armband removed by staff, pt discharged from ED via wheelchair to lobby to return home with daughter.

## 2020-04-29 NOTE — Discharge Instructions (Addendum)
1.  Start taking her amlodipine 10 mg daily.  If your blood pressure remains greater than 160 top number or 90 bottom number, take a tablet of hydralazine.  You may take hydralazine up to 3 times a day for blood pressure remains elevated despite having taken your amlodipine.  Keep a log and measure your blood pressures 3 times daily. 2.  Make an appointment to see your doctor for recheck as soon as possible.  Return to the emergency department if you develop headache, confusion, visual problems, chest pain, incoordination or other concerning symptoms.

## 2020-04-29 NOTE — ED Notes (Signed)
Pt transported to CT ?

## 2020-04-29 NOTE — ED Notes (Signed)
MD Pfieffer made aware of lactic 2.2

## 2020-04-29 NOTE — ED Notes (Signed)
Pt IV that was placed by CT after EMS line was pulled out infiltrated. This nurse stopped bolus and removed IV. New IV placed. Pt denies pain.

## 2020-04-29 NOTE — Progress Notes (Signed)
Lower extremity venous LT study completed.  Preliminary results relayed to Pfeiffer, MD.   See CV Proc for preliminary results report.   Darlin Coco, RDMS

## 2020-04-29 NOTE — ED Provider Notes (Signed)
[facility] EMERGENCY DEPARTMENT Provider Note   CSN: [id number] Arrival date & time: 04/29/20  4010     History Chief Complaint  Patient presents with  . Groin Pain  . Emesis    Michelle [name] is a 59 y.o. female.  HPI Reports that she got up at about 6 AM to go the bathroom.  She reports at that time she noticed that she had a severe pain in her left groin area.  She reports the pain was extremely intense and she got very nauseated.  She reports she was in the bathroom and vomited.  She did end up lying on the floor and could not get back up.  Patient denies that she was on the floor because she fell.  She reports it was because she had such severe pain and then got weak once she had vomited.  Her daughter came to her home to check on her because she had been calling and could not get her to answer the phone.  EMS was contacted.  Upon EMS arrival patient was found to be very hypertensive with blood pressures 210/120.  Patient has been out of her blood pressure medications for over a week due to problems with follow-up appointments and prescriptions.  Her daughter was actually taking her to appointments today to get her prescriptions and up-to-date on appointments and follow-ups.  Patient does have history of a cerebral aneurysm and coil.  Patient did get fentanyl on route.  She reports the pain continues to be severe and there is no comfortable position for her.  He denies any problems leading up to this with pain or burning with urination.  She has not noted blood in the urine she has not been noting flank pain.    Past Medical History:  Diagnosis Date  . ANEMIA-B12 DEFICIENCY 07/11/2009  . ANEMIA-IRON DEFICIENCY 06/21/2009  . ANXIETY 02/06/2007  . Arthritis    "back" (12/12/2016)  . BACK PAIN 07/01/2007  . Benzodiazepine overdose 12/03/2011   July 2013  . CAD (coronary artery disease)    a. LHC 12/12/16: Multivessel CAD b. s/p CABG on 12/17/2016 with LIMA-LAD, SVG-D1,  and Seq SVG-PDA-PL  . DEPRESSION 02/06/2007  . [place] DISEASE, CERVICAL 05/03/2009  . ELEVATED BLOOD PRESSURE WITHOUT DIAGNOSIS OF HYPERTENSION 07/01/2007  . Genital herpes 01/03/2011  . GERD 07/11/2009  . HEAD TRAUMA, CLOSED 11/29/2009  . History of surgery for cerebral aneurysm 2015  . HYPERLIPIDEMIA 02/06/2007  . Insomnia, unspecified 02/06/2007  . LOW BACK PAIN 02/06/2007  . Major depression 11/16/2010  . MIGRAINE HEADACHE 02/06/2007   "used to get them all the time; stopped a few years ago" (12/12/2016)  . Stroke [facility]) 2016-2017 X 3   RLE "goes to the side a little bit when I walk" (12/12/2016)    Patient Active Problem List   Diagnosis Date Noted  . Closed fracture of right distal radius 06/24/2018  . CAD (coronary artery disease) 02/18/2017  . Severe bipolar I disorder, current or most recent episode depressed ([place]) 12/20/2016  . 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 Angina pectoris ([place])   . Bilateral leg edema 12/05/2016  . Estrogen deficiency 12/05/2016  . BCC (basal cell carcinoma of skin) 07/17/2016  . Constipation 07/27/2014  . Tobacco use disorder 11/24/2013  . 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 Subarachnoid hemorrhage due to ruptured aneurysm ([place]) 11/15/2013  . Benzodiazepine overdose 12/03/2011  . Genital herpes 01/03/2011  . Leg weakness, bilateral 01/02/2011  . Vaginal  lesion 01/02/2011  . Leukocytosis 01/02/2011  . Major depression 11/16/2010  . Nausea & vomiting 11/16/2010  . Abdominal tenderness 11/16/2010  . Right flank tenderness 11/16/2010  . Preventative health care 10/24/2010  . Essential hypertension 08/30/2010  . VITAMIN B12 DEFICIENCY 07/26/2009  . ANEMIA-B12 DEFICIENCY 07/11/2009  . GERD 07/11/2009  . ANEMIA-IRON DEFICIENCY 06/21/2009  . [place] DISEASE, CERVICAL 05/03/2009  . BACK PAIN 07/01/2007  . 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 Hyperlipidemia, unspecified 02/06/2007  . Anxiety state 02/06/2007  . Depression, prolonged 02/06/2007  .  MIGRAINE HEADACHE 02/06/2007  . OSTEOARTHRITIS 02/06/2007  . LOW BACK PAIN 02/06/2007  . Insomnia, unspecified 02/06/2007    Past Surgical History:  Procedure Laterality Date  . ABDOMINAL HYSTERECTOMY  2005   "still have my ovaries"  . ANKLE FRACTURE SURGERY Right ?1991  . ANTERIOR CERVICAL DECOMP/DISCECTOMY FUSION  2008   C5-6  . APPENDECTOMY  04/2001   lap appy/notes 10/11/2010  . BACK SURGERY    . CARDIAC CATHETERIZATION  12/12/2016  . CEREBRAL ANEURYSM REPAIR    . CORONARY ARTERY BYPASS GRAFT N/A 12/17/2016   Procedure: CORONARY ARTERY BYPASS GRAFTING (CABG) x 4, using left internal mammary artery and right greater saphenous vein harvested endoscopically;  Surgeon: Gaye Pollack, MD;  Location: [facility] OR;  Service: Open Heart Surgery;  Laterality: N/A;  . CYSTOSCOPY W/ URETEROSCOPY  02/17/2002    Cystoscopy, right retrograde pyelogram with interpretation, left ureteroscopy with holmium and right ureteroscopy/notes 10/11/2010  . INTRAOPERATIVE TRANSESOPHAGEAL ECHOCARDIOGRAM N/A 12/17/2016   Procedure: INTRAOPERATIVE TRANSESOPHAGEAL ECHOCARDIOGRAM;  Surgeon: Gaye Pollack, MD;  Location: [facility] OR;  Service: Open Heart Surgery;  Laterality: N/A;  . LEFT HEART CATH AND CORONARY ANGIOGRAPHY N/A 12/12/2016   Procedure: Left Heart Cath and Coronary Angiography;  Surgeon: Jettie Booze, MD;  Location: [facility] LAB;  Service: Cardiovascular;  Laterality: N/A;  . TUBAL LIGATION       OB History   No obstetric history on file.     Family History  Problem Relation Age of Onset  . Diabetes Other   . Hypertension Other   . Coronary artery disease Other 2       Female 1st degree relative  . Cancer - Lung Father   . Cirrhosis Brother   . Diabetes Brother     Social History   Tobacco Use  . Smoking status: Former Smoker    Packs/day: 1.50    Years: 42.00    Pack years: 63.00    Types: Cigarettes    Quit date: 11/14/2016    Years since quitting: 3.4  . Smokeless tobacco:  Never Used  Vaping Use  . Vaping Use: Never used  Substance Use Topics  . Alcohol use: Yes    Comment: 12/12/2016 "2 drinks per month"  . Drug use: No    Home Medications Prior to Admission medications   Medication Sig Start Date End Date Taking? Authorizing Provider  amLODipine (NORVASC) 2.5 MG tablet Take 2.5 mg by mouth daily. 04/25/20  Yes [provider]  aspirin 81 MG tablet Take 4 tablets (325 mg total) by mouth daily. Patient taking differently: Take 81-162 mg by mouth daily.  06/26/18  Yes Minus Breeding, MD  gabapentin (NEURONTIN) 400 MG capsule Take 800 mg by mouth 3 (three) times daily.  06/30/18  Yes [provider]  QUEtiapine (SEROQUEL) 200 MG tablet Take 200 mg by mouth daily.    Yes [provider]  venlafaxine XR (EFFEXOR-XR) 75 MG 24  hr capsule Take 75 mg by mouth daily. 04/25/20  Yes [provider]  amLODipine (NORVASC) 10 MG tablet Take 1 tablet (10 mg total) by mouth daily. 04/29/20   Charlesetta Shanks, MD  hydrALAZINE (APRESOLINE) 25 MG tablet Take 1 tablet (25 mg total) by mouth 3 (three) times daily as needed (blood pressre greater than 160/90). 04/29/20   Charlesetta Shanks, MD  ondansetron (ZOFRAN ODT) 4 MG disintegrating tablet Take 1 tablet (4 mg total) by mouth every 4 (four) hours as needed for nausea or vomiting. 04/29/20   Charlesetta Shanks, MD  venlafaxine XR (EFFEXOR XR) 75 MG 24 hr capsule Take 1 capsule (75 mg total) by mouth daily with breakfast. 04/29/20   Charlesetta Shanks, MD    Allergies    Citalopram, Diclofenac sodium, Fluoxetine hcl, Ibuprofen, and Lamictal [lamotrigine]  Review of Systems   Review of Systems 10 systems reviewed and negative except as per HPI Physical Exam Updated Vital Signs BP (!) 162/144   Pulse 80   Temp 98.6 F (37 C) (Oral)   Resp (!) 25   Ht 5\' 2"  (1.575 m)   Wt 56.7 kg   SpO2 97%   BMI 22.86 kg/m   Physical Exam Constitutional:      Comments: Patient is in severe distress.  She is  moving about stretcher not able to get any comfortable position.  She is complaining of severe pain in her left hip and groin area.  Tachypnea.  HENT:     Head: Normocephalic and atraumatic.     Mouth/Throat:     Pharynx: Oropharynx is clear.  Eyes:     Extraocular Movements: Extraocular movements intact.     Pupils: Pupils are equal, round, and reactive to light.  Cardiovascular:     Rate and Rhythm: Normal rate and regular rhythm.  Pulmonary:     Comments: Tachypnea.  Lungs grossly clear. Abdominal:     Comments: Endorses significant abdominal pain to palpation in the left and left lateral quadrant and flank area.  Can not appreciate a palpable mass or a pulsatile mass.  She endorses severe pain in the left groin.  Femoral pulses are present.  Musculoskeletal:     Comments: Endorsing severe pain with any palpation around the trochanter or anterior left hip.  Pedal pulses are bilaterally intact.  No significant peripheral edema or calf tenderness.  Patient does not tolerate any movement of the left hip.  Skin:    General: Skin is warm and dry.     Coloration: Skin is pale.  Neurological:     General: No focal deficit present.     Mental Status: She is oriented to person, place, and time.     Cranial Nerves: No cranial nerve deficit.     Motor: No weakness.     Coordination: Coordination normal.  Psychiatric:     Comments: On arrival, extremely anxious and in distress     ED Results / Procedures / Treatments   Labs (all labs ordered are listed, but only abnormal results are displayed) Labs Reviewed  CBC WITH DIFFERENTIAL/PLATELET - Abnormal; Notable for the following components:      Result Value   WBC 15.8 (*)    Neutro Abs 13.0 (*)    All other components within normal limits  COMPREHENSIVE METABOLIC PANEL - Abnormal; Notable for the following components:   CO2 19 (*)    Glucose, Bld 105 (*)    Calcium 8.6 (*)    Total Protein 6.3 (*)  All other components within  normal limits  LACTIC ACID, PLASMA - Abnormal; Notable for the following components:   Lactic Acid, Venous 2.2 (*)    All other components within normal limits  URINALYSIS, ROUTINE W REFLEX MICROSCOPIC - Abnormal; Notable for the following components:   Color, Urine STRAW (*)    Specific Gravity, Urine 1.036 (*)    Hgb urine dipstick SMALL (*)    Ketones, ur 5 (*)    All other components within normal limits  PHOSPHORUS - Abnormal; Notable for the following components:   Phosphorus 1.8 (*)    All other components within normal limits  RESP PANEL BY RT-PCR (FLU A&B, COVID) ARPGX2  LIPASE, BLOOD  LACTIC ACID, PLASMA  PROTIME-INR  MAGNESIUM  TSH  CK  TROPONIN I (HIGH SENSITIVITY)  TROPONIN I (HIGH SENSITIVITY)    EKG EKG Interpretation  Date/Time:  Friday April 29 2020 09:09:31 EST Ventricular Rate:  83 PR Interval:    QRS Duration: 135 QT Interval:  420 QTC Calculation: 494 R Axis:   21 Text Interpretation: Sinus rhythm Probable left atrial enlargement Right bundle branch block No significant change since last tracing Confirmed by Wandra Arthurs [phone]) on 04/30/2020 11:20:00 PM   Radiology CT Head Wo Contrast  Result Date: 04/29/2020 CLINICAL DATA:  Vomiting, found down EXAM: CT HEAD WITHOUT CONTRAST TECHNIQUE: Contiguous axial images were obtained from the base of the skull through the vertex without intravenous contrast. COMPARISON:  2018 FINDINGS: There is streak artifact at the level aneurysm clipping in the region of right MCA bifurcation. Brain: There is no acute intracranial hemorrhage, mass effect, edema, or new loss of gray-white differentiation within the above limitation. Right temporal encephalomalacia is again seen. There is no extra-axial fluid collection. Ventricles and sulci are stable in size and configuration. Vascular: Aneurysm clipping. There is atherosclerotic calcification at the skull base. Skull: Right craniotomy. Sinuses/Orbits: No acute finding. Other:  None. IMPRESSION: No acute intracranial abnormality. Electronically Signed   By: Macy Mis M.D.   On: 04/29/2020 10:44   DG Pelvis Portable  Result Date: 04/29/2020 CLINICAL DATA:  Fall EXAM: PORTABLE PELVIS 1-2 VIEWS COMPARISON:  None. FINDINGS: There is no evidence of pelvic fracture or diastasis. No pelvic bone lesions are seen. IMPRESSION: Negative. Electronically Signed   By: Franchot Gallo M.D.   On: 04/29/2020 10:03   DG Chest Port 1 View  Result Date: 04/29/2020 CLINICAL DATA:  Left hip pain.  Nausea vomiting. EXAM: PORTABLE CHEST 1 VIEW COMPARISON:  01/23/2017. FINDINGS: Prior CABG. Heart size stable. Borderline pulmonary venous congestion. Low lung volumes. No focal alveolar infiltrate. No pleural effusion or pneumothorax. Prior cervical spine fusion. IMPRESSION: 1. Prior CABG. Heart size stable. Borderline pulmonary venous congestion. 2. Low lung volumes. No focal infiltrate. Electronically Signed   By: Marcello Moores  Register   On: 04/29/2020 10:03   CT Angio Chest/Abd/Pel for Dissection W and/or W/WO  Result Date: 04/29/2020 CLINICAL DATA:  Abdominal pain, sharp LEFT-sided groin pain, nausea, vomiting, found lying on bathroom floor, question aortic dissection; history coronary artery disease post CABG, stroke, central hypertension, former smoker EXAM: CT ANGIOGRAPHY CHEST, ABDOMEN AND PELVIS TECHNIQUE: Non-contrast CT of the chest was initially obtained. Multidetector CT imaging through the chest, abdomen and pelvis was performed using the standard protocol during bolus administration of intravenous contrast. Multiplanar reconstructed images and MIPs were obtained and reviewed to evaluate the vascular anatomy. CONTRAST:  168mL OMNIPAQUE IOHEXOL 350 MG/ML SOLN IV COMPARISON:  Digitized film images from 02/12/2002 FINDINGS:  CTA CHEST FINDINGS Cardiovascular: Atherosclerotic calcifications aorta and coronary arteries with post surgical changes of CABG. No intramural hematoma. Aorta normal caliber  without aneurysm or dissection. Heart unremarkable. No pericardial effusion. Pulmonary arteries well opacified and patent. No evidence of pulmonary embolism. Mediastinum/Nodes: Small hiatal hernia. No thoracic adenopathy. Base of cervical region normal appearance. Lungs/Pleura: Lungs clear. No pulmonary infiltrate, pleural effusion, or pneumothorax. Musculoskeletal: Osseous structures unremarkable. Review of the MIP images confirms the above findings. CTA ABDOMEN AND PELVIS FINDINGS VASCULAR Aorta: Scattered atherosclerotic calcifications. Normal caliber. No aneurysm or dissection. Celiac: Widely patent SMA: Widely patent Renals: Widely patent IMA: Patent Inflow: Scattered atherosclerotic calcifications in the iliac systems bilaterally without significant stenosis. Veins: Unopacified Review of the MIP images confirms the above findings. NON-VASCULAR Hepatobiliary: Small indeterminate RIGHT lobe liver lesion, 14 x 10 mm image 113. Gallbladder and liver otherwise normal appearance. Pancreas: Normal appearance Spleen: Normal appearance Adrenals/Urinary Tract: BILATERAL nonobstructing renal calculi largest at inferior pole LEFT kidney 12 mm diameter. Cortical scarring LEFT kidney. No additional renal mass or hydronephrosis. No ureteral calcification or dilatation. Bladder unremarkable. Adrenal glands normal appearance. Stomach/Bowel: Appendix surgically absent. Hiatal hernia. Stomach and bowel loops otherwise normal appearance. Lymphatic: No adenopathy. Reproductive: Uterus surgically absent.  Unremarkable ovaries. Other: No free air or free fluid. No hernia or inflammatory process. No retroperitoneal or para-aortic hemorrhage. Musculoskeletal: Degenerative disc disease changes especially L2-L3 and L3-L4. Review of the MIP images confirms the above findings. IMPRESSION: No evidence of aortic aneurysm or dissection. Scattered atherosclerotic calcifications as above. No evidence of pulmonary embolism. Small hiatal hernia.  Small indeterminate RIGHT lobe liver lesion 14 x 10 mm, can assess by follow-up ultrasound. BILATERAL nonobstructing renal calculi and LEFT renal cortical scarring. No acute intrathoracic, intra-abdominal or intrapelvic abnormalities. Aortic Atherosclerosis (ICD10-I70.0). Electronically Signed   By: Lavonia Dana M.D.   On: 04/29/2020 12:16   VAS [place] LOWER EXTREMITY VENOUS (DVT) (ONLY [facility] & [facility])  Result Date: 04/29/2020  Lower Venous DVT Study Indications: Lt groin pain.  Comparison Study: No prior studies. Performing Technologist: Darlin Coco, RDMS  Examination Guidelines: A complete evaluation includes B-mode imaging, spectral Doppler, color Doppler, and power Doppler as needed of all accessible portions of each vessel. Bilateral testing is considered an integral part of a complete examination. Limited examinations for reoccurring indications may be performed as noted. The reflux portion of the exam is performed with the patient in reverse Trendelenburg.  +-----+---------------+---------+-----------+----------+--------------+ RIGHTCompressibilityPhasicitySpontaneityPropertiesThrombus Aging +-----+---------------+---------+-----------+----------+--------------+ CFV  Full           Yes      Yes                                 +-----+---------------+---------+-----------+----------+--------------+   +---------+---------------+---------+-----------+----------+--------------+ LEFT     CompressibilityPhasicitySpontaneityPropertiesThrombus Aging +---------+---------------+---------+-----------+----------+--------------+ CFV      Full           Yes      Yes                                 +---------+---------------+---------+-----------+----------+--------------+ SFJ      Full                                                        +---------+---------------+---------+-----------+----------+--------------+  FV Prox  Full                                                         +---------+---------------+---------+-----------+----------+--------------+ FV Mid   Full                                                        +---------+---------------+---------+-----------+----------+--------------+ FV DistalFull                                                        +---------+---------------+---------+-----------+----------+--------------+ PFV      Full                                                        +---------+---------------+---------+-----------+----------+--------------+ POP      Full           Yes      Yes                                 +---------+---------------+---------+-----------+----------+--------------+ PTV      Full                                                        +---------+---------------+---------+-----------+----------+--------------+ PERO     Full                                                        +---------+---------------+---------+-----------+----------+--------------+     Summary: RIGHT: - No evidence of common femoral vein obstruction.  LEFT: - There is no evidence of deep vein thrombosis in the lower extremity.  - No cystic structure found in the popliteal fossa.  *See table(s) above for measurements and observations.    Preliminary     Procedures Procedures (including critical care time) CRITICAL CARE Performed by: Charlesetta Shanks   Total critical care time: 30 minutes  Critical care time was exclusive of separately billable procedures and treating other patients.  Critical care was necessary to treat or prevent imminent or life-threatening deterioration.  Critical care was time spent personally by me on the following activities: development of treatment plan with patient and/or surrogate as well as nursing, discussions with consultants, evaluation of patient's response to treatment, examination of patient, obtaining history from patient or surrogate, ordering and performing treatments and  interventions, ordering and review of laboratory studies, ordering and review of radiographic studies, pulse oximetry and re-evaluation of patient's condition. Medications Ordered in ED  Medications  HYDROmorphone (DILAUDID) injection 1 mg (1 mg Intravenous Given 04/29/20 1255)  ondansetron (ZOFRAN) injection 4 mg (has no administration in time range)  ondansetron (ZOFRAN) injection 4 mg (4 mg Intravenous Given 04/29/20 0957)  lactated ringers bolus 500 mL (0 mLs Intravenous Stopped 04/29/20 1252)  HYDROmorphone (DILAUDID) injection 2 mg (2 mg Intravenous Given 04/29/20 1044)  iohexol (OMNIPAQUE) 350 MG/ML injection 100 mL (100 mLs Intravenous Contrast Given 04/29/20 1134)  lactated ringers bolus 500 mL (0 mLs Intravenous Stopped 04/29/20 1359)  metoprolol tartrate (LOPRESSOR) injection 5 mg (5 mg Intravenous Given 04/29/20 1317)  amLODipine (NORVASC) tablet 10 mg (10 mg Oral Given 04/29/20 1317)    ED Course  I have reviewed the triage vital signs and the nursing notes.  Pertinent labs & imaging results that were available during my care of the patient were reviewed by me and considered in my medical decision making (see chart for details).    MDM Rules/Calculators/A&P                         Presented with severe pain in her left lateral abdomen, lower abdomen and groin as well as hip area.  She had been on the floor due to collapse although she denies syncope.  Arrival she was in severe distress.  Work-up initiated to rule out bony or orthopedic injury as well as vascular emergency.  Patient was severely hypertensive on arrival.  She required multiple doses of pain medication for pain control.  Vascular studies did not identify any dissections or appearance of arterial occlusions.  Pulses were peripherally intact.  Extremities were warm.  Also no orthopedic fractures or dislocations identified.  DVTs ruled out.  No emergent cause for patient's symptoms identified.  Patient does have a kidney stones,  none in the ureter at this time.  Consideration given to possible passage of a stone that already passed prior to CT scanning.  Patient's pain upon presentation was severe and unremitting.  However at time of disposition, patient was comfortable.  She continued to be significantly hypertensive.  I offered admission for hypertensive urgency and continued observation for pain control.  At this time however patient's daughter and patient significantly had preference for continued home management and restarting blood pressure medications on outpatient basis.  Strict return precautions provided. Final Clinical Impression(s) / ED Diagnoses Final diagnoses:  Non-intractable vomiting with nausea, unspecified vomiting type  Left inguinal pain  Hypertensive urgency    Rx / DC Orders ED Discharge Orders         Ordered    amLODipine (NORVASC) 10 MG tablet  Daily        04/29/20 1509    venlafaxine XR (EFFEXOR XR) 75 MG 24 hr capsule  Daily with breakfast        04/29/20 1509    hydrALAZINE (APRESOLINE) 25 MG tablet  3 times daily PRN        04/29/20 1509    ondansetron (ZOFRAN ODT) 4 MG disintegrating tablet  Every 4 hours PRN        04/29/20 1509           Charlesetta Shanks, MD 05/06/20 1535

## 2020-04-29 NOTE — ED Triage Notes (Signed)
Pt brought to ED via EMS from home with c/o sharp left sided groin pain, nausea, and vomiting. Per EMS, pt's daughter found pt lying in bathroom floor this AM after she couldn't get in touch with her on the phone. Pt denies falling, no obvious deformities present. Pt states she woke up around 6AM this AM to use the bathroom, vomiting and pain began as soon as she stood up. Pt hypertensive with EMS, 210/120 on arrival to home. Pt states she hasn't had BP meds x 1 week. Hx CVA, triple bypass sx, brain aneurysm.  8 mg IV zofran, 50 mcg fentanyl x2, 350 mL NS given by EMS prior to arrival.  Pt currently A&O x4, hypertensive, pain rated 10/10.

## 2020-04-29 NOTE — ED Notes (Signed)
PT pain uncontrolled, RR sustained > 30. MD Pfieffer made aware. Nurse to give additional 2mg  dose of Dilaudid.

## 2020-05-04 ENCOUNTER — Other Ambulatory Visit: Payer: Self-pay

## 2020-05-04 DIAGNOSIS — I872 Venous insufficiency (chronic) (peripheral): Secondary | ICD-10-CM

## 2020-05-13 ENCOUNTER — Telehealth: Payer: Self-pay

## 2020-05-13 ENCOUNTER — Ambulatory Visit: Payer: PPO | Admitting: Physician Assistant

## 2020-05-13 ENCOUNTER — Ambulatory Visit (HOSPITAL_COMMUNITY)
Admission: RE | Admit: 2020-05-13 | Discharge: 2020-05-13 | Disposition: A | Discharge status: Home / Self Care | Payer: PPO | Source: Ambulatory Visit | Attending: Physician Assistant | Admitting: Physician Assistant

## 2020-05-13 ENCOUNTER — Other Ambulatory Visit: Payer: Self-pay

## 2020-05-13 VITALS — BP 104/68 | HR 82 | Temp 98.2°F | Resp 20 | Ht 62.0 in | Wt 135.2 lb

## 2020-05-13 DIAGNOSIS — I872 Venous insufficiency (chronic) (peripheral): Secondary | ICD-10-CM

## 2020-05-13 NOTE — Progress Notes (Signed)
Measured patient for compression Calf 33 cm Ankle 21 cm

## 2020-05-13 NOTE — Progress Notes (Signed)
[facility]           [facility] [place]  History and Physical   Michelle [name] is a 59 y.o. female who presents with mild leg swelling and occasional achiness of both legs.  They are both equal.  She states she has cramping in her legs at night but not while walking.  She has never had any blood clots.  Her mother has hx of varicose veins.  She has not worn compression socks in the past.  She does not have skin color changes.  She denies any claudication, rest pain or non healing wounds.  She states she gets occasional tingling in both legs.  She is on neurontin for this.   She has hx of right leg saphenous vein harvest in 2018 during CABG.  She has a hx of edema and was prescribed lasix for this in mid 2020 by Cardiology.  She was recently in the ER on 04/29/2020 for N/V and severe pain in the left groin area and underwent DVT study of the left leg and this was negative.  She was found to be severely hypertensive and had not had any BP medications due to running out.    She has hx of hypertension, HLD, OA, CAD with hx of CABG x 4 with saphenous vein harvest from the right leg, cerebral aneurysm with hx of coiling, bipolar disorder.  The pt is not on a statin for cholesterol management.  The pt is on a daily aspirin.   Other AC:  none The pt is on CCB for hypertension.   The pt is not diabetic.   Tobacco hx: current  She does not have family hx of AAA.  Past Medical History:  Diagnosis Date  . ANEMIA-B12 DEFICIENCY 07/11/2009  . ANEMIA-IRON DEFICIENCY 06/21/2009  . ANXIETY 02/06/2007  . Arthritis    "back" (12/12/2016)  . BACK PAIN 07/01/2007  . Benzodiazepine overdose 12/03/2011   July 2013  . CAD (coronary artery disease)    a. LHC 12/12/16: Multivessel CAD b. s/p CABG on 12/17/2016 with LIMA-LAD, SVG-D1, and Seq SVG-PDA-PL  . DEPRESSION 02/06/2007  . [place] DISEASE, CERVICAL 05/03/2009  . ELEVATED BLOOD PRESSURE WITHOUT DIAGNOSIS OF HYPERTENSION 07/01/2007  .  Genital herpes 01/03/2011  . GERD 07/11/2009  . HEAD TRAUMA, CLOSED 11/29/2009  . History of surgery for cerebral aneurysm 2015  . HYPERLIPIDEMIA 02/06/2007  . Insomnia, unspecified 02/06/2007  . LOW BACK PAIN 02/06/2007  . Major depression 11/16/2010  . MIGRAINE HEADACHE 02/06/2007   "used to get them all the time; stopped a few years ago" (12/12/2016)  . Stroke [facility]) 2016-2017 X 3   RLE "goes to the side a little bit when I walk" (12/12/2016)    Past Surgical History:  Procedure Laterality Date  . ABDOMINAL HYSTERECTOMY  2005   "still have my ovaries"  . ANKLE FRACTURE SURGERY Right ?1991  . ANTERIOR CERVICAL DECOMP/DISCECTOMY FUSION  2008   C5-6  . APPENDECTOMY  04/2001   lap appy/notes 10/11/2010  . BACK SURGERY    . CARDIAC CATHETERIZATION  12/12/2016  . CEREBRAL ANEURYSM REPAIR    . CORONARY ARTERY BYPASS GRAFT N/A 12/17/2016   Procedure: CORONARY ARTERY BYPASS GRAFTING (CABG) x 4, using left internal mammary artery and right greater saphenous vein harvested endoscopically;  Surgeon: Gaye Pollack, MD;  Location: [place] OR;  Service: Open Heart Surgery;  Laterality: N/A;  . CYSTOSCOPY W/ URETEROSCOPY  02/17/2002  Cystoscopy, right retrograde pyelogram with interpretation, left ureteroscopy with holmium and right ureteroscopy/notes 10/11/2010  . INTRAOPERATIVE TRANSESOPHAGEAL ECHOCARDIOGRAM N/A 12/17/2016   Procedure: INTRAOPERATIVE TRANSESOPHAGEAL ECHOCARDIOGRAM;  Surgeon: Gaye Pollack, MD;  Location: [facility] OR;  Service: Open Heart Surgery;  Laterality: N/A;  . LEFT HEART CATH AND CORONARY ANGIOGRAPHY N/A 12/12/2016   Procedure: Left Heart Cath and Coronary Angiography;  Surgeon: Jettie Booze, MD;  Location: [place] CV LAB;  Service: Cardiovascular;  Laterality: N/A;  . TUBAL LIGATION      Social History   Socioeconomic History  . Marital status: Widowed    Spouse name: Not on file  . Number of children: Not on file  . Years of education: Not on file  . Highest education  level: Not on file  Occupational History  . 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 Smoking status: Former Smoker    Packs/day: 1.50    Years: 42.00    Pack years: 63.00    Types: Cigarettes    Quit date: 11/14/2016    Years since quitting: 3.4  . Smokeless tobacco: Never Used  Vaping Use  . Vaping Use: Never used  Substance and Sexual Activity  . Alcohol use: Yes    Comment: 12/12/2016 "2 drinks per month"  . Drug use: No  . Sexual activity: Never  Other Topics Concern  . Not on file  Social History Narrative   Currently living apart from husband   Social Determinants of Health   Financial Resource Strain: Not on file  Food Insecurity: Not on file  Transportation Needs: Not on file  Physical Activity: Not on file  Stress: Not on file  Social Connections: Not on file  Intimate Partner Violence: Not on file     Family History  Problem Relation Age of Onset  . Diabetes Other   . Hypertension Other   . Coronary artery disease Other 48       Female 1st degree relative  . Cancer - Lung Father   . Cirrhosis Brother   . Diabetes Brother     Current Outpatient Medications  Medication Sig Dispense Refill  . amLODipine (NORVASC) 10 MG tablet Take 1 tablet (10 mg total) by mouth daily. 30 tablet 0  . amLODipine (NORVASC) 2.5 MG tablet Take 2.5 mg by mouth daily.    Marland Kitchen aspirin 81 MG tablet Take 4 tablets (325 mg total) by mouth daily. (Patient taking differently: Take 81-162 mg by mouth daily. )    . gabapentin (NEURONTIN) 400 MG capsule Take 800 mg by mouth 3 (three) times daily.     . hydrALAZINE (APRESOLINE) 25 MG tablet Take 1 tablet (25 mg total) by mouth 3 (three) times daily as needed (blood pressre greater than 160/90). 60 tablet 0  . HYDROcodone-acetaminophen (NORCO/VICODIN) 5-325 MG tablet Take 2 tablets by mouth every 4 (four) hours as needed. 20 tablet 0  . ondansetron (ZOFRAN ODT) 4 MG  disintegrating tablet Take 1 tablet (4 mg total) by mouth every 4 (four) hours as needed for nausea or vomiting. 20 tablet 0  . QUEtiapine (SEROQUEL) 200 MG tablet Take 200 mg by mouth daily.     Marland Kitchen venlafaxine XR (EFFEXOR XR) 75 MG 24 hr capsule Take 1 capsule (75 mg total) by mouth daily with breakfast. 30 capsule 0  . venlafaxine XR (EFFEXOR-XR) 75 MG 24 hr capsule Take 75 mg by mouth daily.     No  current facility-administered medications for this visit.    Allergies  Allergen Reactions  . Citalopram Other (See Comments)    "Feels wierd"  . Diclofenac Sodium Nausea Only    GI Upset  . Fluoxetine Hcl Nausea Only  . Ibuprofen Nausea Only  . Lamictal [Lamotrigine] Rash    REVIEW OF SYSTEMS:   [X]  denotes positive finding, [ ]  denotes negative finding Cardiac  Comments:  Chest pain or chest pressure:    Shortness of breath upon exertion:    Short of breath when lying flat:    Irregular heart rhythm:        Vascular    Pain in calf, thigh, or hip brought on by ambulation:    Pain in feet at night that wakes you up from your sleep:     Blood clot in your veins:    Leg swelling:  x mild      Pulmonary    Oxygen at home:    Productive cough:     Wheezing:         Neurologic    Sudden weakness in arms or legs:     Sudden numbness in arms or legs:     Sudden onset of difficulty speaking or slurred speech:    Temporary loss of vision in one eye:     Problems with dizziness:         Gastrointestinal    Blood in stool:     Vomited blood:         Genitourinary    Burning when urinating:     Blood in urine:        Psychiatric    Major depression:         Hematologic    Bleeding problems:    Problems with blood clotting too easily:        Skin    Rashes or ulcers:        Constitutional    Fever or chills:      PHYSICAL EXAMINATION:  Today's Vitals   05/13/20 1046  BP: 104/68  Pulse: 82  Resp: 20  Temp: 98.2 F (36.8 C)  TempSrc: Temporal  SpO2: 96%   Weight: 135 lb 3.2 oz (61.3 kg)  Height: 5\' 2"  (1.575 m)  PainSc: 4   PainLoc: Leg   Body mass index is 24.73 kg/m.   General:  WDWN in NAD; vital signs documented above Gait: Not observed HENT: WNL, normocephalic Pulmonary: normal non-labored breathing without wheezing Cardiac: regular HR; without carotid bruits Abdomen: soft, NT, no masses; aortic pulse is not palpable Skin: without rashes Vascular Exam/Pulses:  Right Left  Radial 2+ (normal) 2+ (normal)  Ulnar 2+ (normal) 2+ (normal)  Femoral 2+(normal) 2+(normal)  Popliteal Unable to palpate Unable to palpate  DP 2+ (normal) 2+ (normal)  PT 2+ (normal) 2+ (normal)   Extremities: without ischemic changes, without cellulitis; without open wounds; no skin color changes Musculoskeletal: no muscle wasting or atrophy  Neurologic: A&O X 3;  moving all extremities equally Psychiatric:  The pt has Normal affect.   Non-Invasive Vascular Imaging:   Venous duplex on 05/13/2020: Venous Reflux Times  +--------------+---------+------+-----------+------------+--------------+  RIGHT     Reflux NoRefluxReflux TimeDiameter cmsComments                  Yes                      +--------------+---------+------+-----------+------------+--------------+  CFV      no                            +--------------+---------+------+-----------+------------+--------------+  FV mid    no                            +--------------+---------+------+-----------+------------+--------------+  Popliteal   no                            +--------------+---------+------+-----------+------------+--------------+  GSV at [facility]  no               0.50           +--------------+---------+------+-----------+------------+--------------+  GSV prox thighno                0.32           +--------------+---------+------+-----------+------------+--------------+  GSV mid thigh                     Not visualized  +--------------+---------+------+-----------+------------+--------------+  GSV dist thigh                    Not visualized  +--------------+---------+------+-----------+------------+--------------+  GSV at knee                      Not visualized  +--------------+---------+------+-----------+------------+--------------+  GSV prox calf       yes  >500 ms   0.17           +--------------+---------+------+-----------+------------+--------------+  GSV mid calf       yes  >500 ms                +--------------+---------+------+-----------+------------+--------------+  GSV dist calf       yes  >500 ms                +--------------+---------+------+-----------+------------+--------------+  SSV Pop Fossa no               0.14           +--------------+---------+------+-----------+------------+--------------+  SSV prox calf no               0.15           +--------------+---------+------+-----------+------------+--------------+  SSV mid calf no               0.16           +--------------+---------+------+-----------+------------+--------------+  +------------------------+---------+------+-----------+------------+-------  -+  LEFT          Reflux NoRefluxReflux TimeDiameter  cmsComments                    Yes                  +------------------------+---------+------+-----------+------------+-------  CFV           no                        +------------------------+---------+------+-----------+------------+-------   FV mid         no                        +------------------------+---------+------+-----------+------------+-------  Popliteal        no                        +------------------------+---------+------+-----------+------------+-------  GSV at [facility]       no               0.48       +------------------------+---------+------+-----------+------------+-------  GSV prox thigh  yes  >500 ms   0.48       +------------------------+---------+------+-----------+------------+-------  GSV mid thigh            yes  >500 ms   0.25    branches  +------------------------+---------+------+-----------+------------+-------  GSV dist thigh     no               0.27       +------------------------+---------+------+-----------+------------+-------  GSV at knee       no               0.26       +------------------------+---------+------+-----------+------------+-------  GSV prox calf      no               0.18       +------------------------+---------+------+-----------+------------+-------  SSV Pop Fossa      no               0.34       +------------------------+---------+------+-----------+------------+-------  SSV prox calf      no               0.24       +------------------------+---------+------+-----------+------------+-------  SSV mid calf      no               0.20       +------------------------+---------+------+-----------+------------+-------  Perforator proximal calf      yes  >500 ms            +------------------------+---------+------+-----------+------------+-------  Perforator distal calf       yes  >500 ms             +------------------------+---------+------+-----------+------------+-------   Summary:    Bilateral:  - No evidence of deep vein thrombosis seen in the lower extremities,  bilaterally, from the common femoral through the popliteal veins.  - No evidence of superficial venous thrombosis in the lower extremities,  bilaterally.  - No evidence of deep venous insufficiency seen bilaterally in the lower  extremity.    Right:  - Venous reflux is noted in the right greater saphenous vein in the calf.  Left:  - Venous reflux is noted in the left greater saphenous vein in the thigh.  - Venous reflux is noted in the left perforator veins.   Michelle [name] is a 59 y.o. female who presents with: mild bilateral leg swelling with occasional tingling and achiness of both legs equally  -she does have venous reflux in the right GSV in the calf (GSV in thigh has been harvested) as well as the left GSV in the proximal and mid thigh and perforators in the calf, however, she does not have reflux in the SFJ and the vein is not adequate diameter for ablation.   -pt is not a candidate for ablation and I discussed with pt about wearing knee high 15-20mmHg compression stockings and elevating legs.   Handout given. -discussed the importance of smoking cessation -pt will f/u as needed   Leontine Locket, [facility] 05/13/2020 10:19 AM  Clinic MD:  Donzetta Matters

## 2020-05-13 NOTE — Telephone Encounter (Signed)
error 

## 2020-05-23 DIAGNOSIS — R202 Paresthesia of skin: Secondary | ICD-10-CM | POA: Diagnosis not present

## 2020-05-23 DIAGNOSIS — Z8673 Personal history of transient ischemic attack (TIA), and cerebral infarction without residual deficits: Secondary | ICD-10-CM | POA: Diagnosis not present

## 2020-05-23 DIAGNOSIS — I872 Venous insufficiency (chronic) (peripheral): Secondary | ICD-10-CM | POA: Diagnosis not present

## 2020-05-23 DIAGNOSIS — I10 Essential (primary) hypertension: Secondary | ICD-10-CM | POA: Diagnosis not present

## 2020-05-23 DIAGNOSIS — Z720 Tobacco use: Secondary | ICD-10-CM | POA: Diagnosis not present

## 2020-05-23 DIAGNOSIS — F3341 Major depressive disorder, recurrent, in partial remission: Secondary | ICD-10-CM | POA: Diagnosis not present

## 2020-07-01 DIAGNOSIS — Z1329 Encounter for screening for other suspected endocrine disorder: Secondary | ICD-10-CM | POA: Diagnosis not present

## 2020-07-01 DIAGNOSIS — Z8673 Personal history of transient ischemic attack (TIA), and cerebral infarction without residual deficits: Secondary | ICD-10-CM | POA: Diagnosis not present

## 2020-07-01 DIAGNOSIS — I10 Essential (primary) hypertension: Secondary | ICD-10-CM | POA: Diagnosis not present

## 2020-07-01 DIAGNOSIS — Z13220 Encounter for screening for lipoid disorders: Secondary | ICD-10-CM | POA: Diagnosis not present

## 2020-07-01 DIAGNOSIS — Z720 Tobacco use: Secondary | ICD-10-CM | POA: Diagnosis not present

## 2020-07-01 DIAGNOSIS — I872 Venous insufficiency (chronic) (peripheral): Secondary | ICD-10-CM | POA: Diagnosis not present

## 2020-07-01 DIAGNOSIS — F3341 Major depressive disorder, recurrent, in partial remission: Secondary | ICD-10-CM | POA: Diagnosis not present

## 2020-07-01 DIAGNOSIS — Z131 Encounter for screening for diabetes mellitus: Secondary | ICD-10-CM | POA: Diagnosis not present

## 2020-07-01 DIAGNOSIS — R202 Paresthesia of skin: Secondary | ICD-10-CM | POA: Diagnosis not present

## 2020-07-01 DIAGNOSIS — R1084 Generalized abdominal pain: Secondary | ICD-10-CM | POA: Diagnosis not present

## 2020-08-12 DIAGNOSIS — I872 Venous insufficiency (chronic) (peripheral): Secondary | ICD-10-CM | POA: Diagnosis not present

## 2020-08-12 DIAGNOSIS — I10 Essential (primary) hypertension: Secondary | ICD-10-CM | POA: Diagnosis not present

## 2020-08-12 DIAGNOSIS — E782 Mixed hyperlipidemia: Secondary | ICD-10-CM | POA: Diagnosis not present

## 2020-08-12 DIAGNOSIS — F3341 Major depressive disorder, recurrent, in partial remission: Secondary | ICD-10-CM | POA: Diagnosis not present

## 2020-08-12 DIAGNOSIS — K219 Gastro-esophageal reflux disease without esophagitis: Secondary | ICD-10-CM | POA: Diagnosis not present

## 2020-08-12 DIAGNOSIS — Z8673 Personal history of transient ischemic attack (TIA), and cerebral infarction without residual deficits: Secondary | ICD-10-CM | POA: Diagnosis not present

## 2020-08-12 DIAGNOSIS — Z720 Tobacco use: Secondary | ICD-10-CM | POA: Diagnosis not present

## 2020-08-12 DIAGNOSIS — R202 Paresthesia of skin: Secondary | ICD-10-CM | POA: Diagnosis not present

## 2020-09-30 DIAGNOSIS — Z0000 Encounter for general adult medical examination without abnormal findings: Secondary | ICD-10-CM | POA: Diagnosis not present

## 2020-09-30 DIAGNOSIS — K219 Gastro-esophageal reflux disease without esophagitis: Secondary | ICD-10-CM | POA: Diagnosis not present

## 2020-09-30 DIAGNOSIS — Z8673 Personal history of transient ischemic attack (TIA), and cerebral infarction without residual deficits: Secondary | ICD-10-CM | POA: Diagnosis not present

## 2020-09-30 DIAGNOSIS — I10 Essential (primary) hypertension: Secondary | ICD-10-CM | POA: Diagnosis not present

## 2020-09-30 DIAGNOSIS — Z720 Tobacco use: Secondary | ICD-10-CM | POA: Diagnosis not present

## 2020-09-30 DIAGNOSIS — F3341 Major depressive disorder, recurrent, in partial remission: Secondary | ICD-10-CM | POA: Diagnosis not present

## 2020-09-30 DIAGNOSIS — E782 Mixed hyperlipidemia: Secondary | ICD-10-CM | POA: Diagnosis not present

## 2020-09-30 DIAGNOSIS — M792 Neuralgia and neuritis, unspecified: Secondary | ICD-10-CM | POA: Diagnosis not present

## 2020-12-23 DIAGNOSIS — K219 Gastro-esophageal reflux disease without esophagitis: Secondary | ICD-10-CM | POA: Diagnosis not present

## 2020-12-23 DIAGNOSIS — Z0000 Encounter for general adult medical examination without abnormal findings: Secondary | ICD-10-CM | POA: Diagnosis not present

## 2020-12-23 DIAGNOSIS — Z131 Encounter for screening for diabetes mellitus: Secondary | ICD-10-CM | POA: Diagnosis not present

## 2020-12-23 DIAGNOSIS — M792 Neuralgia and neuritis, unspecified: Secondary | ICD-10-CM | POA: Diagnosis not present

## 2020-12-23 DIAGNOSIS — I10 Essential (primary) hypertension: Secondary | ICD-10-CM | POA: Diagnosis not present

## 2020-12-23 DIAGNOSIS — Z720 Tobacco use: Secondary | ICD-10-CM | POA: Diagnosis not present

## 2020-12-23 DIAGNOSIS — E782 Mixed hyperlipidemia: Secondary | ICD-10-CM | POA: Diagnosis not present

## 2020-12-23 DIAGNOSIS — Z8673 Personal history of transient ischemic attack (TIA), and cerebral infarction without residual deficits: Secondary | ICD-10-CM | POA: Diagnosis not present

## 2020-12-23 DIAGNOSIS — F3341 Major depressive disorder, recurrent, in partial remission: Secondary | ICD-10-CM | POA: Diagnosis not present

## 2021-01-06 DIAGNOSIS — D485 Neoplasm of uncertain behavior of skin: Secondary | ICD-10-CM | POA: Diagnosis not present

## 2021-01-06 DIAGNOSIS — D1801 Hemangioma of skin and subcutaneous tissue: Secondary | ICD-10-CM | POA: Diagnosis not present

## 2021-01-06 DIAGNOSIS — L821 Other seborrheic keratosis: Secondary | ICD-10-CM | POA: Diagnosis not present

## 2021-01-06 DIAGNOSIS — C44319 Basal cell carcinoma of skin of other parts of face: Secondary | ICD-10-CM | POA: Diagnosis not present

## 2021-01-06 DIAGNOSIS — D229 Melanocytic nevi, unspecified: Secondary | ICD-10-CM | POA: Diagnosis not present

## 2021-03-14 DIAGNOSIS — C44319 Basal cell carcinoma of skin of other parts of face: Secondary | ICD-10-CM | POA: Diagnosis not present

## 2021-04-17 IMAGING — CT CT HEAD W/O CM
4 series · 17 of 47 positions shown, 19 images · non-contrast
Comparison: [id number]

CLINICAL DATA: Vomiting, found down

EXAM:
CT HEAD WITHOUT CONTRAST
TECHNIQUE: Contiguous axial images were obtained from the base of the skull
through the vertex without intravenous contrast.

[Series 3: head without · axial · non-contrast · 0.43mm/px · z∈[-67,+53]mm · 7 of 33 slices shown, 9 images]
[im 5/33  brain]
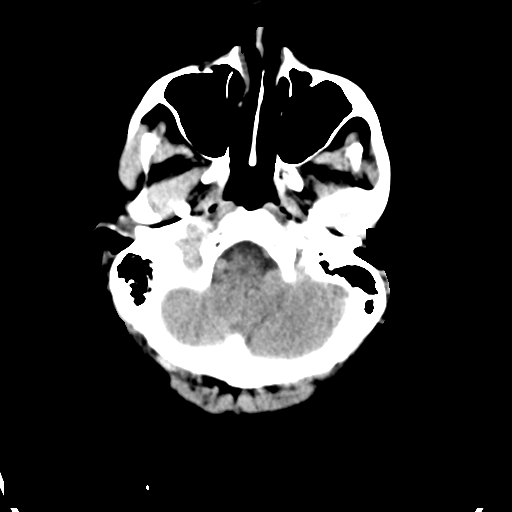
[im 5/33  bone]
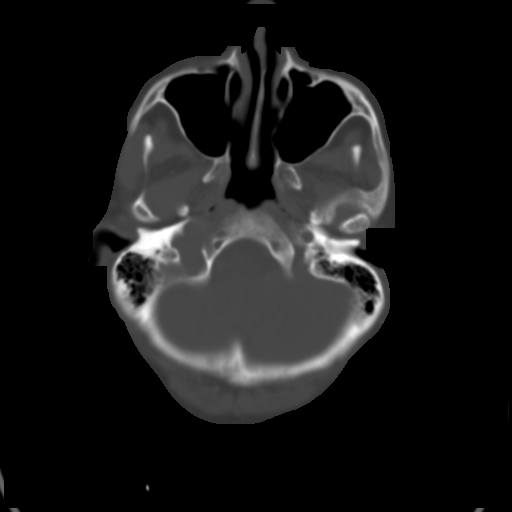
[im 9/33  brain]
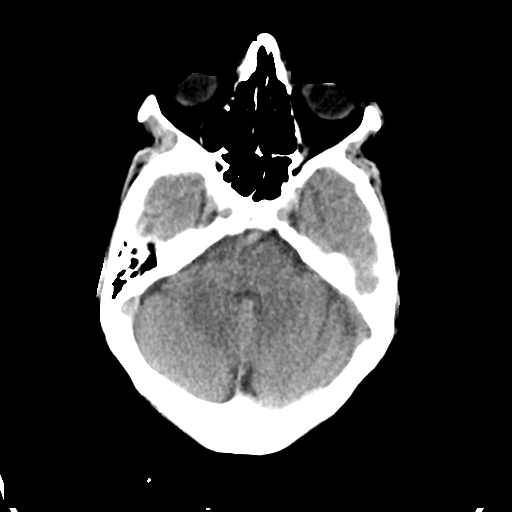
[im 13/33  brain]
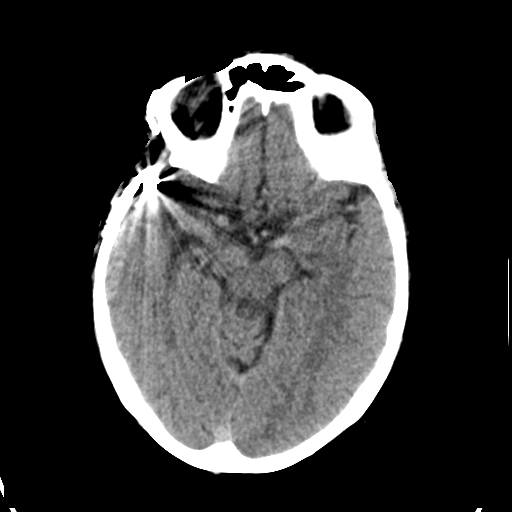
[im 17/33  brain]
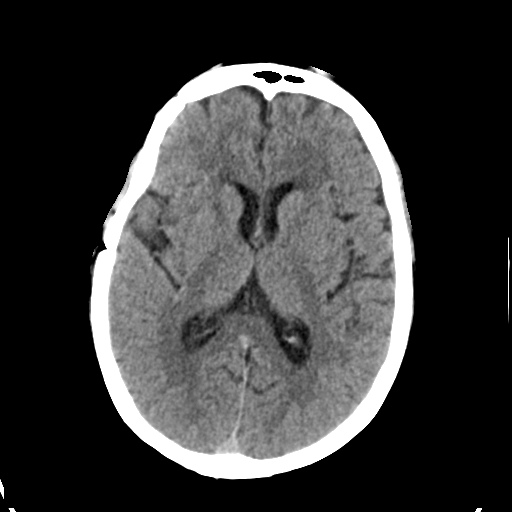
[im 21/33  brain]
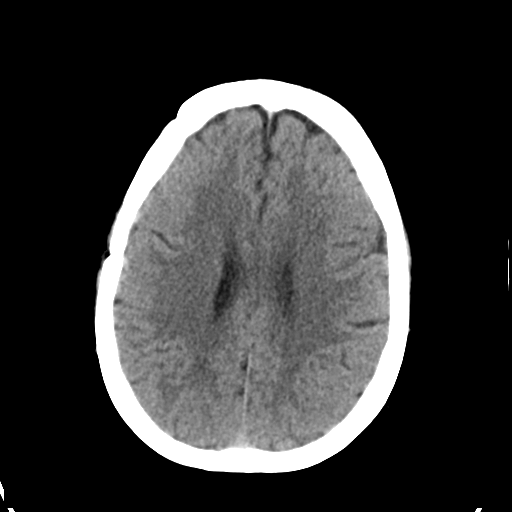
[im 21/33  bone]
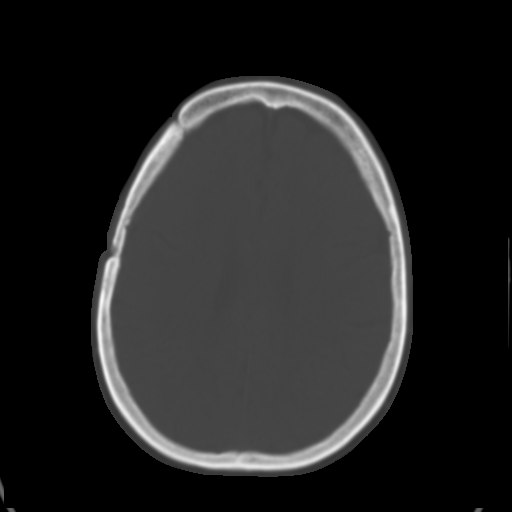
[im 25/33  brain]
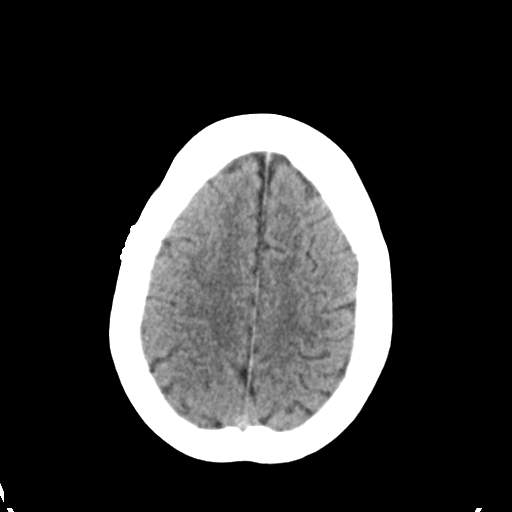
[im 29/33  brain]
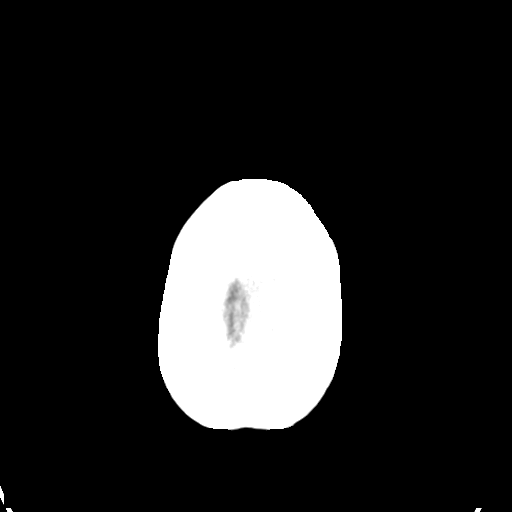

[Series 4: head bone · axial · 0.43mm/px · z∈[-71,-15]mm · 4 of 81 slices shown]
[im 9/81  bone]
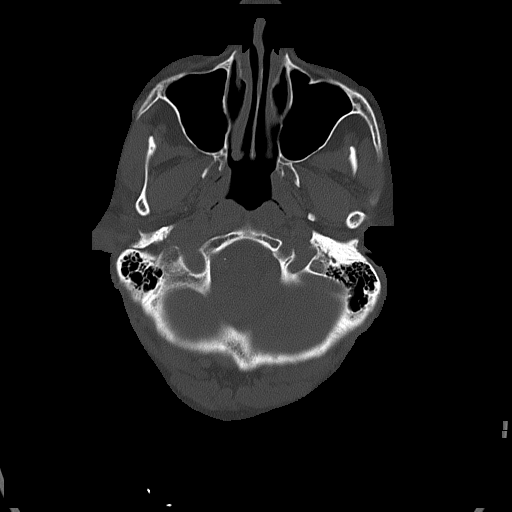
[im 17/81  bone]
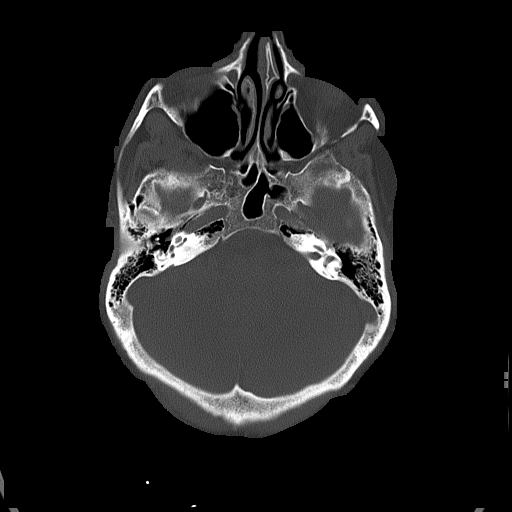
[im 25/81  bone]
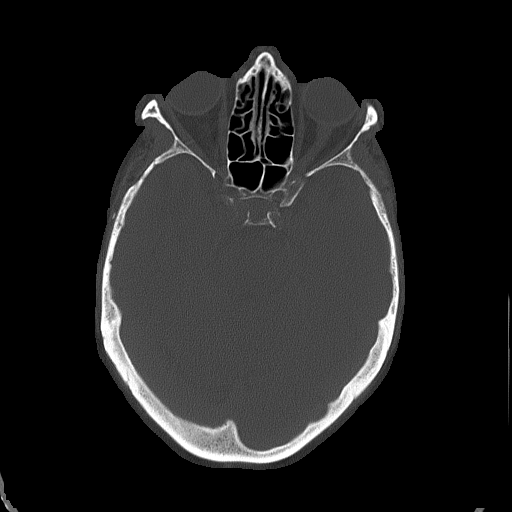
[im 37/81  bone]
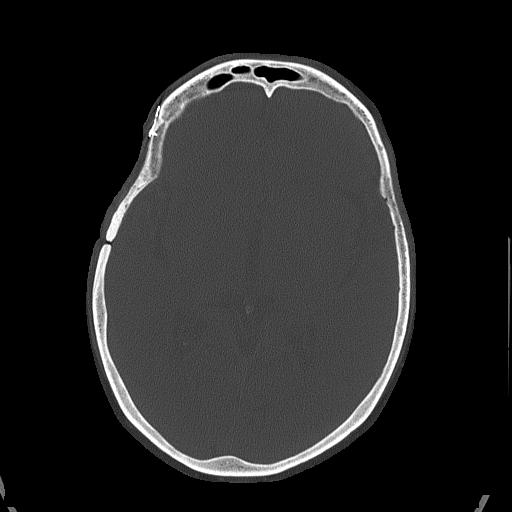

[Series 5: head without cor · coronal · non-contrast · 0.31mm/px · 3 of 66 slices shown]
[im 22/66  brain]
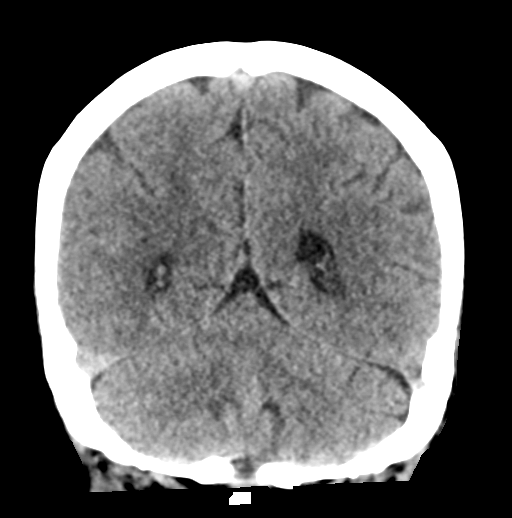
[im 29/66  brain]
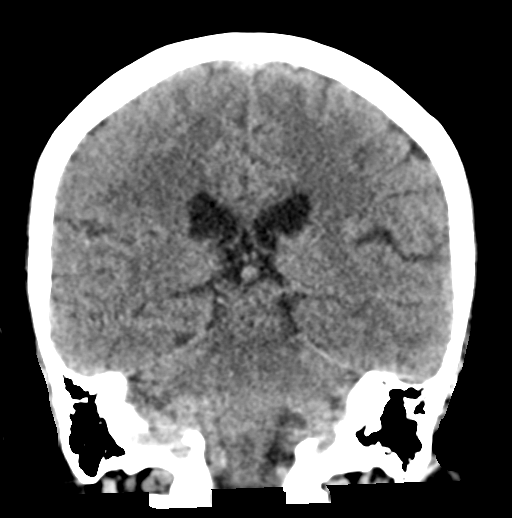
[im 37/66  brain]
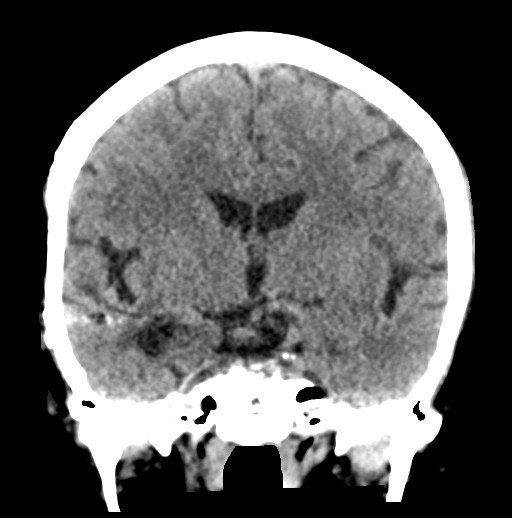

[Series 6: head without sag · sagittal · non-contrast · 0.31mm/px · 3 of 49 slices shown]
[im 17/49  brain]
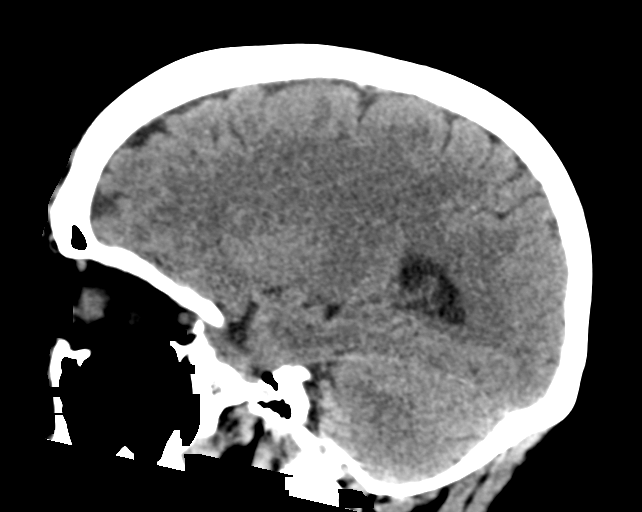
[im 25/49  brain]
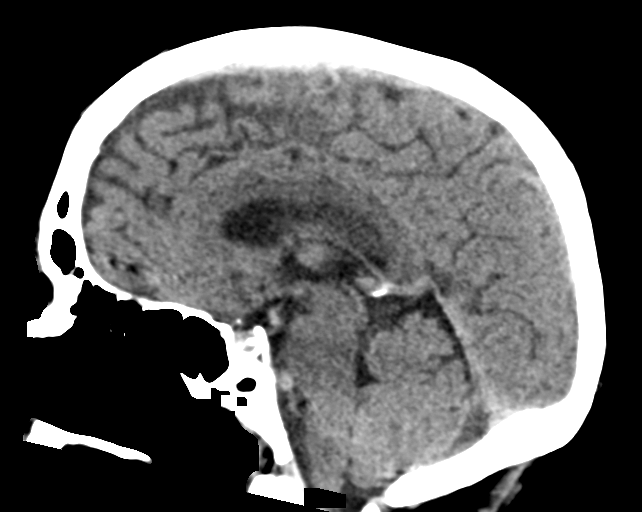
[im 33/49  brain]
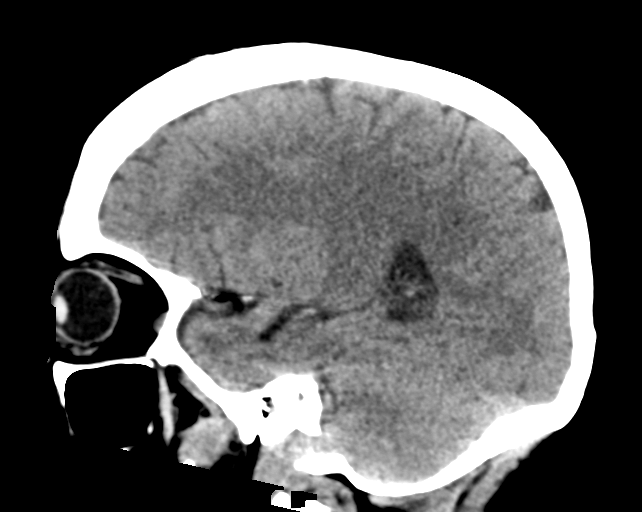

[17 of 47 positions shown; findings below may reference images not displayed]

FINDINGS: There is streak artifact at the level aneurysm clipping in the
region of right MCA bifurcation.

Brain: There is no acute intracranial hemorrhage, mass effect,
edema, or new loss of gray-white differentiation within the above
limitation. Right temporal encephalomalacia is again seen. There is
no extra-axial fluid collection. Ventricles and sulci are stable in
size and configuration.

Vascular: Aneurysm clipping. There is atherosclerotic calcification
at the skull base.

Skull: Right craniotomy.

Sinuses/Orbits: No acute finding.

Other: None.
IMPRESSION: No acute intracranial abnormality.

## 2021-05-05 DIAGNOSIS — E782 Mixed hyperlipidemia: Secondary | ICD-10-CM | POA: Diagnosis not present

## 2021-05-05 DIAGNOSIS — M792 Neuralgia and neuritis, unspecified: Secondary | ICD-10-CM | POA: Diagnosis not present

## 2021-05-05 DIAGNOSIS — I10 Essential (primary) hypertension: Secondary | ICD-10-CM | POA: Diagnosis not present

## 2021-05-05 DIAGNOSIS — F3341 Major depressive disorder, recurrent, in partial remission: Secondary | ICD-10-CM | POA: Diagnosis not present

## 2021-05-05 DIAGNOSIS — Z8673 Personal history of transient ischemic attack (TIA), and cerebral infarction without residual deficits: Secondary | ICD-10-CM | POA: Diagnosis not present

## 2021-05-05 DIAGNOSIS — K219 Gastro-esophageal reflux disease without esophagitis: Secondary | ICD-10-CM | POA: Diagnosis not present

## 2021-05-05 DIAGNOSIS — Z720 Tobacco use: Secondary | ICD-10-CM | POA: Diagnosis not present

## 2021-10-24 ENCOUNTER — Other Ambulatory Visit: Payer: Self-pay | Admitting: Physician Assistant

## 2021-10-24 DIAGNOSIS — R5381 Other malaise: Secondary | ICD-10-CM

## 2021-12-26 ENCOUNTER — Other Ambulatory Visit: Payer: Self-pay | Admitting: Physician Assistant

## 2021-12-26 DIAGNOSIS — Z780 Asymptomatic menopausal state: Secondary | ICD-10-CM

## 2022-01-10 ENCOUNTER — Encounter: Payer: Self-pay | Admitting: Physician Assistant

## 2022-02-08 NOTE — Progress Notes (Unsigned)
02/09/2022 Michelle [name] [id number] 10/08/1960  Referring provider: Trey Sailors, PA Primary GI doctor: Dr. Henrene Pastor  ASSESSMENT AND PLAN:   Assessment: 61 y.o. female here for assessment of the following: 1. Incontinence of feces, unspecified fecal incontinence type   2. Gastroesophageal reflux disease without esophagitis   3. S/P CABG x 89    61 year old female with 2 months of fecal incontinence, denies weight loss, denies hematochezia. Last colonoscopy 2021 with poor prep, 5-year follow-up did not follow-up. On examination patient has very decreased rectal tone, supple internal hemorrhoids, possible some rectal posterior abnormality visualizing that is tender. GERD well-controlled with Prilosec Patient had an ejection fraction 4045% prior to CABG in 2018, has not had repeat echo but denies orthopnea, has no significant leg swelling today, no chest pain.  Plan: Causes of fecal incontinence include anal sphincter weakness, decreased rectal sensation, decreased rectal compliance, overflow, and idiopathic fecal incontinence. Can proceed with KUB to evaluate stool burden Colonoscopy to evaluate further as patient is due, constipation and previous prep would probably benefit from 2-day prep. -Hydrocortisone supp give and external cream sent in.  possible component of pelvis floor dysfunction with history and symptoms.  Will treat with miralax/fiber, squatty potty. May have to add on stimulant.  Can refer to pelvic floor PT Can consider anal manometry.  Refill omeprazole Encourage patient to follow-up with cardiology  Orders Placed This Encounter  Procedures   DG Abd 1 View   CBC with Differential/Platelet   Comprehensive metabolic panel   TSH   Sedimentation rate   High sensitivity CRP   Ambulatory referral to Gastroenterology    Meds ordered this encounter  Medications   polyethylene glycol (MIRALAX / GLYCOLAX) 17 g packet    Sig: Take 17 g by mouth 2 (two)  times daily.    Dispense:  60 packet    Refill:  3   omeprazole (PRILOSEC) 20 MG capsule    Sig: Take 1 capsule (20 mg total) by mouth daily before breakfast.    Dispense:  90 capsule    Refill:  3   PEG-KCl-NaCl-NaSulf-Na Asc-C (PLENVU) 140 g SOLR    Sig: Take 1 kit by mouth as directed. Use coupon: BIN: [id number] PNC: CNRX Group: [id number] ID: [id number]    Dispense:  1 each    Refill:  0     Patient Care Team: Trey Sailors, PA as PCP - General (Physician Assistant) Lelon Perla, MD as PCP - Cardiology (Cardiology)  HISTORY OF PRESENT ILLNESS: 61 y.o. female with a past medical history of bipolar disorder, B12 deficiency, anemia, coronary artery disease status post bypass 2018, patient is not on a blood thinner, last echocardiogram EF 40 to 62% grade 2 diastolic dysfunction 2297 prior to the CABG, history of subarachnoid hemorrhage due to ruptured aneurysm 2015, GERD and others listed below presents for evaluation of fecal incontinence.    08/04/2009 colonoscopy with Dr. Henrene Pastor for normocytic iron deficiency anemia showed fair prep with movie prep completely unremarkable recall 5 years due to prep limitation 08/04/2009 endoscopy for iron deficiency showed atrophic gastric mucosa, otherwise unremarkable, biopsies mild intestine negative celiac  She has swelling in legs, no swelling in AB. She has occ DOE, continues to smoke. She has had some weight gain in last year, no weight loss.  Denies orthopnea.   Daughter [place] here with her.  She has had fecal incontinence for 3 months.  She has constipation for years, can have 3-7 days without a  BM, then will take laxatives and she will have BM's for 2 days.  There is lack of awareness of the need to defecate, she will feel a warmth and then know that she has had a BM. Marland Kitchen Can be small or moderate amount, will stick to her rectum.  Will happened 3 x a week.  Never occurs at night.  She has fecal urgency.   No OTC medications, no  new medications.    No history of prior hemorrhoid, fissure or anorectal surgery, pelvic irradiation, or neurologic disease.  No associated urinary incontinence but can feel that it takes a while for her to release the urine.   She has lower back pain, some hip, no rectal pain.  No protrusion of tissue from the anal canal.  2 kids, vaginal deliveries.  Number of vaginal deliveries. No prolonged labor.  She has GERD but well controlled with omeprazole 20 mg daily.    She  reports that she quit smoking about 5 years ago. Her smoking use included cigarettes. She has a 63.00 pack-year smoking history. She has never used smokeless tobacco. She reports that she does not currently use alcohol. She reports that she does not use drugs.  Current Medications:    Current Outpatient Medications (Cardiovascular):    amLODipine (NORVASC) 2.5 MG tablet, Take 2.5 mg by mouth daily.   rosuvastatin (CRESTOR) 20 MG tablet, 1 tab(s) orally once a day AT BEDTIME for 30 day(s)   hydrALAZINE (APRESOLINE) 25 MG tablet, Take 1 tablet (25 mg total) by mouth 3 (three) times daily as needed (blood pressre greater than 160/90). (Patient not taking: Reported on 05/13/2020)   Current Outpatient Medications (Analgesics):    aspirin 81 MG tablet, Take 4 tablets (325 mg total) by mouth daily. (Patient taking differently: Take 81-162 mg by mouth daily.)   HYDROcodone-acetaminophen (NORCO/VICODIN) 5-325 MG tablet, Take 2 tablets by mouth every 4 (four) hours as needed. (Patient not taking: Reported on 05/13/2020)   Current Outpatient Medications (Other):    gabapentin (NEURONTIN) 400 MG capsule, Take 800 mg by mouth 3 (three) times daily.    omeprazole (PRILOSEC) 20 MG capsule, Take 1 capsule (20 mg total) by mouth daily before breakfast.   PEG-KCl-NaCl-NaSulf-Na Asc-C (PLENVU) 140 g SOLR, Take 1 kit by mouth as directed. Use coupon: BIN: [id number] PNC: CNRX Group: [id number] ID: [id number]   polyethylene glycol (MIRALAX  / GLYCOLAX) 17 g packet, Take 17 g by mouth 2 (two) times daily.   QUEtiapine (SEROQUEL) 200 MG tablet, Take 200 mg by mouth daily.    venlafaxine XR (EFFEXOR XR) 75 MG 24 hr capsule, Take 1 capsule (75 mg total) by mouth daily with breakfast.   ondansetron (ZOFRAN ODT) 4 MG disintegrating tablet, Take 1 tablet (4 mg total) by mouth every 4 (four) hours as needed for nausea or vomiting. (Patient not taking: Reported on 05/13/2020)  Medical History:  Past Medical History:  Diagnosis Date   ANEMIA-B12 DEFICIENCY 07/11/2009   ANEMIA-IRON DEFICIENCY 06/21/2009   ANXIETY 02/06/2007   Arthritis    "back" (12/12/2016)   BACK PAIN 07/01/2007   Benzodiazepine overdose 12/03/2011   July 2013   CAD (coronary artery disease)    a. LHC 12/12/16: Multivessel CAD b. s/p CABG on 12/17/2016 with LIMA-LAD, SVG-D1, and Seq SVG-PDA-PL   DEPRESSION 02/06/2007   DISC DISEASE, CERVICAL 05/03/2009   ELEVATED BLOOD PRESSURE WITHOUT DIAGNOSIS OF HYPERTENSION 07/01/2007   Genital herpes 01/03/2011   GERD 07/11/2009   HEAD TRAUMA, CLOSED 11/29/2009  History of surgery for cerebral aneurysm 2015   HYPERLIPIDEMIA 02/06/2007   Insomnia, unspecified 02/06/2007   LOW BACK PAIN 02/06/2007   Major depression 11/16/2010   MIGRAINE HEADACHE 02/06/2007   "used to get them all the time; stopped a few years ago" (12/12/2016)   Stroke ([place]) 2016-2017 X 3   RLE "goes to the side a little bit when I walk" (12/12/2016)   Allergies:  Allergies  Allergen Reactions   Citalopram Other (See Comments)    "Feels wierd"   Diclofenac Sodium Nausea Only    GI Upset   Fluoxetine Hcl Nausea Only   Ibuprofen Nausea Only   Lamictal [Lamotrigine] Rash     Surgical History:  She  has a past surgical history that includes Cerebral aneurysm repair; Cardiac catheterization (12/12/2016); Anterior cervical decomp/discectomy fusion (2008); Back surgery; Ankle fracture surgery (Right, ?1991); Tubal ligation; Appendectomy (04/2001); Cystoscopy w/ ureteroscopy  (02/17/2002); Abdominal hysterectomy (2005); LEFT HEART CATH AND CORONARY ANGIOGRAPHY (N/A, 12/12/2016); Coronary artery bypass graft (N/A, 12/17/2016); and Intraoprative transesophageal echocardiogram (N/A, 12/17/2016). Family History:  Her family history includes Cancer - Lung in her father; Cirrhosis in her brother; Coronary artery disease (age of onset: 17) in an other family member; Diabetes in her brother and another family member; Hypertension in an other family member.  REVIEW OF SYSTEMS  : All other systems reviewed and negative except where noted in the History of Present Illness.  PHYSICAL EXAM: BP 116/76   Pulse 88   Ht 5' 2" (1.575 m)   Wt 148 lb (67.1 kg)   BMI 27.07 kg/m  General:   Pleasant, well developed female in no acute distress Head:   Normocephalic and atraumatic. Eyes:  sclerae anicteric,conjunctive pink  Heart:   regular rate and rhythm Pulm:  Clear anteriorly; no wheezing Abdomen:   Soft, Obese AB, Sluggish bowel sounds. No tenderness . , No organomegaly appreciated. Rectal: Normal external rectal exam, decreased rectal tone, large cavernous rectum, scant stool, questionable inflamed hemorrhoid versus something questionable/nodularity posterior rectum that is tender, hemoccult negative Extremities:  Without edema. Msk: Symmetrical without gross deformities. Peripheral pulses intact.  Neurologic:  Alert and  oriented x4;  No focal deficits.  Skin:   Dry and intact without significant lesions or rashes. Psychiatric:  Cooperative. Normal mood and affect.    Vladimir Crofts, PA-C 3:19 PM

## 2022-02-09 ENCOUNTER — Ambulatory Visit: Payer: PPO | Admitting: Physician Assistant

## 2022-02-09 ENCOUNTER — Encounter: Payer: Self-pay | Admitting: Physician Assistant

## 2022-02-09 ENCOUNTER — Other Ambulatory Visit (INDEPENDENT_AMBULATORY_CARE_PROVIDER_SITE_OTHER): Payer: PPO

## 2022-02-09 VITALS — BP 116/76 | HR 88 | Ht 62.0 in | Wt 148.0 lb

## 2022-02-09 DIAGNOSIS — Z951 Presence of aortocoronary bypass graft: Secondary | ICD-10-CM | POA: Diagnosis not present

## 2022-02-09 DIAGNOSIS — R159 Full incontinence of feces: Secondary | ICD-10-CM

## 2022-02-09 DIAGNOSIS — K219 Gastro-esophageal reflux disease without esophagitis: Secondary | ICD-10-CM

## 2022-02-09 LAB — COMPREHENSIVE METABOLIC PANEL
ALT: 16 U/L (ref 0–35)
AST: 26 U/L (ref 0–37)
Albumin: 4.1 g/dL (ref 3.5–5.2)
Alkaline Phosphatase: 133 U/L — ABNORMAL HIGH (ref 39–117)
BUN: 10 mg/dL (ref 6–23)
CO2: 30 mEq/L (ref 19–32)
Calcium: 9.2 mg/dL (ref 8.4–10.5)
Chloride: 103 mEq/L (ref 96–112)
Creatinine, Ser: 0.77 mg/dL (ref 0.40–1.20)
GFR: 82.69 mL/min (ref >60.00)
Glucose, Bld: 77 mg/dL (ref 70–99)
Potassium: 3.3 mEq/L — ABNORMAL LOW (ref 3.5–5.1)
Sodium: 141 mEq/L (ref 135–145)
Total Bilirubin: 0.4 mg/dL (ref 0.2–1.2)
Total Protein: 7.3 g/dL (ref 6.0–8.3)

## 2022-02-09 LAB — CBC WITH DIFFERENTIAL/PLATELET
Basophils Absolute: 0.1 10*3/uL (ref 0.0–0.1)
Basophils Relative: 1.1 % (ref 0.0–3.0)
Eosinophils Absolute: 0.3 10*3/uL (ref 0.0–0.7)
Eosinophils Relative: 3.3 % (ref 0.0–5.0)
HCT: 36.8 % (ref 36.0–46.0)
Hemoglobin: 12.3 g/dL (ref 12.0–15.0)
Lymphocytes Relative: 38.4 % (ref 12.0–46.0)
Lymphs Abs: 4 10*3/uL (ref 0.7–4.0)
MCHC: 33.4 g/dL (ref 30.0–36.0)
MCV: 85.7 fl (ref 78.0–100.0)
Monocytes Absolute: 0.7 10*3/uL (ref 0.1–1.0)
Monocytes Relative: 6.9 % (ref 3.0–12.0)
Neutro Abs: 5.3 10*3/uL (ref 1.4–7.7)
Neutrophils Relative %: 50.3 % (ref 43.0–77.0)
Platelets: 324 10*3/uL (ref 150.0–400.0)
RBC: 4.29 Mil/uL (ref 3.87–5.11)
RDW: 15.3 % (ref 11.5–15.5)
WBC: 10.5 10*3/uL (ref 4.0–10.5)

## 2022-02-09 LAB — TSH: TSH: 2.89 u[IU]/mL (ref 0.35–5.50)

## 2022-02-09 LAB — SEDIMENTATION RATE: Sed Rate: 30 mm/hr (ref 0–30)

## 2022-02-09 LAB — HIGH SENSITIVITY CRP: CRP, High Sensitivity: 3.72 mg/L (ref 0.000–5.000)

## 2022-02-09 MED ORDER — PLENVU 140 G PO SOLR: 1.0000 | ORAL | 0 refills | Status: DC

## 2022-02-09 MED ORDER — OMEPRAZOLE 20 MG PO CPDR: 20.0000 mg | DELAYED_RELEASE_CAPSULE | Freq: Every day | ORAL | 3 refills | Status: DC

## 2022-02-09 MED ORDER — POLYETHYLENE GLYCOL 3350 17 G PO PACK: 17.0000 g | PACK | Freq: Two times a day (BID) | ORAL | 3 refills | Status: DC

## 2022-02-09 NOTE — Patient Instructions (Addendum)
You have been scheduled for a colonoscopy. Please follow written instructions given to you at your visit today.  Please pick up your prep supplies at the pharmacy within the next 1-3 days. If you use inhalers (even only as needed), please bring them with you on the day of your procedure.   Your provider has requested that you go to the basement level for lab work before leaving today. Press "B" on the elevator. The lab is located at the first door on the left as you exit the elevator.   Due to recent changes in healthcare laws, you may see the results of your imaging and laboratory studies on MyChart before your provider has had a chance to review them.  We understand that in some cases there may be results that are confusing or concerning to you. Not all laboratory results come back in the same time frame and the provider may be waiting for multiple results in order to interpret others.  Please give [place] 48 hours in order for your provider to thoroughly review all the results before contacting the office for clarification of your results.    Recommendations for the patient: Avoid sugars and caffeine Keep a food and symptom diary to identify causitive factors Keep your bottom clean and dry without excessive wiping or using astringent cleaners Apply a barrier cream such as zinc oxide to the perianal skin Consider using incontinence pads to protect your skin and clothes from fecal soiling  Add on benefiber/citracel 1 TBSP once or twice a day to help form the stool and prevent incontinence.  Recommend starting on a fiber supplement, can try metamucil first but if this causes gas/bloating switch to benefiber or citracel, these do not cause gas.  Take with fiber with with a full 8 oz glass of water once a day. This can take 1 month to start helping, so try for at least one month.  Recommend increasing water and physical activity.   - Drink at least 64-80 ounces of water/liquid per day. - Establish a  time to try to move your bowels every day.  For many people, this is after a cup of coffee or after a meal such as breakfast. - Sit all of the way back on the toilet keeping your back fairly straight and while sitting up, try to rest the tops of your forearms on your upper thighs.   - Raising your feet with a step stool/squatty potty can be helpful to improve the angle that allows your stool to pass through the rectum. - Relax the rectum feeling it bulge toward the toilet water.  If you feel your rectum raising toward your body, you are contracting rather than relaxing. - Breathe in and slowly exhale. "Belly breath" by expanding your belly towards your belly button. Keep belly expanded as you gently direct pressure down and back to the anus.  A low pitched GRRR sound can assist with increasing intra-abdominal pressure.  - Repeat 3-4 times. If unsuccessful, contract the pelvic floor to restore normal tone and get off the toilet.  Avoid excessive straining. - To reduce excessive wiping by teaching your anus to normally contract, place hands on outer aspect of knees and resist knee movement outward.  Hold 5-10 second then place hands just inside of knees and resist inward movement of knees.  Hold 5 seconds.  Repeat a few times each way.  Go to the ER if unable to pass gas, severe AB pain, unable to hold down food, any  shortness of breath of chest pain.   Fecal Incontinence Fecal incontinence, also called accidental bowel leakage, is not being able to control your bowels. This condition happens because the nerves or muscles around the anus do not work the way they should. This affects their ability to hold stool (feces). What are the causes? This condition may be caused by: Damage to the muscles at the end of the rectum (sphincter). Damage to the nerves that control bowel movements. Diarrhea. Chronic constipation. Pelvic floor dysfunction. This means the muscles in the pelvis do not work well. Loss  of bowel storage capacity. This occurs when the rectum can no longer stretch in size in order to store feces. Inflammatory bowel disease (IBD), such as Crohn's disease. Irritable bowel syndrome (IBS). What increases the risk? You are more likely to develop this condition if you: Were born with bowels or a pelvis that did not form correctly. Have had rectal surgery. Have had radiation treatment for certain cancers. Have been pregnant, had a vaginal delivery, or had surgery that damaged the pelvic floor muscles. Had a complicated childbirth, spinal cord injury, or other trauma that caused nerve damage. Have a condition that can affect nerve function, such as diabetes, Parkinson's disease, or multiple sclerosis. Have a condition where the rectum drops down into the anus or vagina (prolapse). Are 13 years of age or older. What are the signs or symptoms? The main symptom of this condition is not being able to control your bowels. You also might not be able to get to the bathroom before a bowel movement. How is this diagnosed? This condition is diagnosed with a medical history and physical exam. You may also have other tests, including: Blood tests. Urine tests. A rectal exam. Ultrasound. MRI. Colonoscopy. This is an exam that looks at your large intestine (colon). Anal manometry. This is a test that measures the strength of the anal sphincter. Anal electromyogram (EMG). This is a test that uses small electrodes to check for nerve damage. How is this treated? Treatment for this condition depends on the cause and severity. Treatment may also focus on addressing any underlying causes of this condition. Treatment may include: Medicines. This may include medicines to: Prevent diarrhea. Help with constipation (bulk-forming laxatives). Treat any underlying conditions. Biofeedback therapy. This can help to retrain muscles that are affected. Fiber supplements. These can help manage your bowel  movements. Nerve stimulation. Injectable gel to promote tissue growth and better muscle control. Surgery. You may need: Sphincter repair surgery. Diversion surgery. This procedure lets feces pass out of your body through a hole in your abdomen. Follow these instructions at home: Eating and drinking  Follow instructions from your health care provider about any eating or drinking restrictions. Work with a dietitian to come up with a healthy diet that will help you avoid the foods that can make your condition worse. Keep a diet diary to find out which foods or drinks could be making your condition worse. Drink enough fluid to keep your urine pale yellow. Lifestyle Do not use any products that contain nicotine or tobacco, such as cigarettes and e-cigarettes. If you need help quitting, ask your health care provider. This may help your condition. If you are overweight, talk with your health care provider about how to safely lose weight. This may help your condition. Increase your physical activity as told by your health care provider. This may help your condition. Always talk with your health care provider before starting a new exercise program. Carry  a change of clothes and supplies to clean up quickly if you have an episode of fetal incontinence. Consider joining a fecal incontinence support group. You can find a support group online or in your local community. General instructions  Take over-the-counter and prescription medicines only as told by your health care provider. This includes any supplements. Apply a moisture barrier, such as petroleum jelly, to your rectum. This protects the skin from irritation caused by ongoing leaking or diarrhea. Tell your health care provider if you are upset or depressed about your condition. Keep all follow-up visits as told by your health care provider. This is important. Where to find more information [employer] for Functional Gastrointestinal  Disorders: iffgd.[place] of Gastro[facility]: patients.gi.org Contact a health care provider if: You have a fever. You have redness, swelling, or pain around your rectum. Your pain is getting worse or you lose feeling in your rectal area. You have blood in your stool. You feel sad or hopeless. You avoid social or work situations. Get help right away if: You stop having bowel movements. You cannot eat or drink without vomiting. You have rectal bleeding that does not stop. You have severe pain that is getting worse. You have symptoms of dehydration, including: Sleepiness or fatigue. Producing little or no urine, tears, or sweat. Dizziness. Dry mouth. Unusual irritability. Headache. Inability to think clearly. Summary Fecal incontinence, also called accidental bowel leakage, is not being able to control your bowels. This condition happens because the nerves or muscles around the anus do not work the way they should. Treatment varies depending on the cause and severity of your condition. Treatment may also focus on addressing any underlying causes of this condition. Follow instructions from your health care provider about any eating or drinking restrictions, lifestyle changes, and skin care. Take over-the-counter and prescription medicines only as told by your health care provider. This includes any supplements. Tell your health care provider if your symptoms worsen or if you are upset or depressed about your condition. This information is not intended to replace advice given to you by your health care provider. Make sure you discuss any questions you have with your health care provider. Document Revised: 09/26/2017 Document Reviewed: 09/26/2017 Elsevier Patient Education  2022 [employer].

## 2022-02-12 NOTE — Progress Notes (Signed)
Agree with the assessment and plan as outlined by Vicie Mutters,  PA-C.  I have no problems performing the colonoscopy as long as Dr. Henrene Pastor does not object.  Patient would likely benefit from pelvic floor physical therapy even if her incontinence is overflow-related  Michelle [name] E. Candis Schatz, MD

## 2022-02-13 ENCOUNTER — Other Ambulatory Visit: Payer: Self-pay

## 2022-02-13 DIAGNOSIS — E876 Hypokalemia: Secondary | ICD-10-CM

## 2022-02-13 DIAGNOSIS — R9389 Abnormal findings on diagnostic imaging of other specified body structures: Secondary | ICD-10-CM

## 2022-02-13 DIAGNOSIS — R748 Abnormal levels of other serum enzymes: Secondary | ICD-10-CM

## 2022-02-19 ENCOUNTER — Telehealth: Payer: Self-pay | Admitting: Physician Assistant

## 2022-02-19 NOTE — Telephone Encounter (Signed)
Contacted patient and offered a sample of Plenvu to pick up from office.

## 2022-02-19 NOTE — Telephone Encounter (Signed)
Inbound call from patient states insurance doesn't cover prep rx plenvu. Requesting another rx.

## 2022-02-23 ENCOUNTER — Encounter: Payer: Self-pay | Admitting: Gastroenterology

## 2022-02-23 ENCOUNTER — Ambulatory Visit (AMBULATORY_SURGERY_CENTER): Payer: PPO | Admitting: Gastroenterology

## 2022-02-23 ENCOUNTER — Other Ambulatory Visit: Payer: Self-pay

## 2022-02-23 VITALS — BP 135/80 | HR 80 | Temp 98.0°F | Resp 15 | Ht 62.0 in | Wt 148.0 lb

## 2022-02-23 DIAGNOSIS — K621 Rectal polyp: Secondary | ICD-10-CM

## 2022-02-23 DIAGNOSIS — R159 Full incontinence of feces: Secondary | ICD-10-CM

## 2022-02-23 DIAGNOSIS — K639 Disease of intestine, unspecified: Secondary | ICD-10-CM

## 2022-02-23 DIAGNOSIS — K635 Polyp of colon: Secondary | ICD-10-CM | POA: Diagnosis not present

## 2022-02-23 DIAGNOSIS — D128 Benign neoplasm of rectum: Secondary | ICD-10-CM

## 2022-02-23 DIAGNOSIS — Z1211 Encounter for screening for malignant neoplasm of colon: Secondary | ICD-10-CM

## 2022-02-23 MED ORDER — SODIUM CHLORIDE 0.9 % IV SOLN: 500.0000 mL | Freq: Once | INTRAVENOUS | Status: AC

## 2022-02-23 NOTE — Progress Notes (Signed)
Called to room to assist during endoscopic procedure.  Patient ID and intended procedure confirmed with present staff. Received instructions for my participation in the procedure from the performing physician.  

## 2022-02-23 NOTE — Progress Notes (Signed)
History and Physical Interval Note:  02/23/2022 11:10 AM  Michelle [name]  has presented today for endoscopic procedure(s), with the diagnosis of  Encounter Diagnosis  Name Primary?   Incontinence of feces, unspecified fecal incontinence type Yes  Colon cancer screening (Primary diagnosis)  .  The various methods of evaluation and treatment have been discussed with the patient and/or family. After consideration of risks, benefits and other options for treatment, the patient has consented to  the endoscopic procedure(s).   The patient's history has been reviewed, patient examined, no change in status, stable for endoscopic procedure(s).  I have reviewed the patient's chart and labs.  Questions were answered to the patient's satisfaction.     Sipriano Fendley E. Candis Schatz, MD [facility]

## 2022-02-23 NOTE — Progress Notes (Signed)
PT taken to PACU. Monitors in place. VSS. Report given to RN. 

## 2022-02-23 NOTE — Progress Notes (Signed)
VS completed by DT.  Pt's states no medical or surgical changes since previsit or office visit.  

## 2022-02-23 NOTE — Patient Instructions (Signed)
Resume previous diet and medications. Awaiting pathology results. Repeat Colonoscopy date to be determined based on pathology results. Follow up with Physical therapy for Pelvic Floor training.  YOU HAD AN ENDOSCOPIC PROCEDURE TODAY AT [facility]:   Refer to the procedure report that was given to you for any specific questions about what was found during the examination.  If the procedure report does not answer your questions, please call your gastroenterologist to clarify.  If you requested that your care partner not be given the details of your procedure findings, then the procedure report has been included in a sealed envelope for you to review at your convenience later.  YOU SHOULD EXPECT: Some feelings of bloating in the abdomen. Passage of more gas than usual.  Walking can help get rid of the air that was put into your GI tract during the procedure and reduce the bloating. If you had a lower endoscopy (such as a colonoscopy or flexible sigmoidoscopy) you may notice spotting of blood in your stool or on the toilet paper. If you underwent a bowel prep for your procedure, you may not have a normal bowel movement for a few days.  Please Note:  You might notice some irritation and congestion in your nose or some drainage.  This is from the oxygen used during your procedure.  There is no need for concern and it should clear up in a day or so.  SYMPTOMS TO REPORT IMMEDIATELY:  Following lower endoscopy (colonoscopy or flexible sigmoidoscopy):  Excessive amounts of blood in the stool  Significant tenderness or worsening of abdominal pains  Swelling of the abdomen that is new, acute  Fever of 100F or higher  For urgent or emergent issues, a gastroenterologist can be reached at any hour by calling [phone]. Do not use MyChart messaging for urgent concerns.    DIET:  We do recommend a small meal at first, but then you may proceed to your regular diet.  Drink plenty of fluids  but you should avoid alcoholic beverages for 24 hours.  ACTIVITY:  You should plan to take it easy for the rest of today and you should NOT DRIVE or use heavy machinery until tomorrow (because of the sedation medicines used during the test).    FOLLOW UP: Our staff will call the number listed on your records the next business day following your procedure.  We will call around 7:15- 8:00 am to check on you and address any questions or concerns that you may have regarding the information given to you following your procedure. If we do not reach you, we will leave a message.     If any biopsies were taken you will be contacted by phone or by letter within the next 1-3 weeks.  Please call us at [phone] if you have not heard about the biopsies in 3 weeks.    SIGNATURES/CONFIDENTIALITY: You and/or your care partner have signed paperwork which will be entered into your electronic medical record.  These signatures attest to the fact that that the information above on your After Visit Summary has been reviewed and is understood.  Full responsibility of the confidentiality of this discharge information lies with you and/or your care-partner.

## 2022-02-23 NOTE — Op Note (Signed)
[facility] Patient Name: Michelle [name] Staff Procedure Date: 02/23/2022 11:10 AM MRN: [id number] Endoscopist: Nicki Reaper E. Candis Schatz , MD Age: 61 Referring MD:  Date of Birth: Nov 04, 1960 Gender: Female Account #: [id number] Procedure:                Colonoscopy Indications:              Screening for colorectal malignant neoplasm (last                            colonoscopy was more than 10 years ago) Medicines:                Monitored Anesthesia Care Procedure:                Pre-Anesthesia Assessment:                           - Prior to the procedure, a History and Physical                            was performed, and patient medications and                            allergies were reviewed. The patient's tolerance of                            previous anesthesia was also reviewed. The risks                            and benefits of the procedure and the sedation                            options and risks were discussed with the patient.                            All questions were answered, and informed consent                            was obtained. Prior Anticoagulants: The patient has                            taken no previous anticoagulant or antiplatelet                            agents except for aspirin. ASA Grade Assessment:                            III - A patient with severe systemic disease. After                            reviewing the risks and benefits, the patient was                            deemed in satisfactory condition to undergo the  procedure.                           After obtaining informed consent, the colonoscope                            was passed under direct vision. Throughout the                            procedure, the patient's blood pressure, pulse, and                            oxygen saturations were monitored continuously. The                            CF [id number] #[id number] was introduced through  the anus                            and advanced to the the terminal ileum, with                            identification of the appendiceal orifice and IC                            valve. The colonoscopy was performed without                            difficulty. The patient tolerated the procedure                            well. The quality of the bowel preparation was                            good. The terminal ileum, ileocecal valve,                            appendiceal orifice, and rectum were photographed.                            The bowel preparation used was Miralax and SUPREP                            via 2-day dose instruction. Scope In: 11:20:10 AM Scope Out: 11:39:48 AM Scope Withdrawal Time: 0 hours 10 minutes 52 seconds  Total Procedure Duration: 0 hours 19 minutes 38 seconds  Findings:                 The perianal and digital rectal examinations were                            normal. Pertinent negatives include normal                            sphincter tone and no palpable rectal lesions.  A scar was found in the ascending colon and in the                            cecum. The scar tissue was healthy in appearance.                            Biopsies were taken with a cold forceps for                            histology. Estimated blood loss was minimal.                           A 3 mm polyp was found in the rectum. The polyp was                            sessile. The polyp was removed with a cold snare.                            Resection and retrieval were complete. Estimated                            blood loss was minimal.                           The exam was otherwise normal throughout the                            examined colon.                           The terminal ileum appeared normal.                           The retroflexed view of the distal rectum and anal                            verge was normal and showed  no anal or rectal                            abnormalities. Complications:            No immediate complications. Estimated Blood Loss:     Estimated blood loss was minimal. Impression:               - Scar in the ascending colon and in the cecum.                            Biopsied.                           - One 3 mm polyp in the rectum, removed with a cold                            snare. Resected and retrieved.                           -  The examined portion of the ileum was normal.                           - The distal rectum and anal verge are normal on                            retroflexion view. Recommendation:           - Patient has a contact number available for                            emergencies. The signs and symptoms of potential                            delayed complications were discussed with the                            patient. Return to normal activities tomorrow.                            Written discharge instructions were provided to the                            patient.                           - Resume previous diet.                           - Continue present medications.                           - Await pathology results.                           - Repeat colonoscopy (date not yet determined) for                            surveillance based on pathology results. Sahithi Ordoyne E. Candis Schatz, MD 02/23/2022 11:49:57 AM This report has been signed electronically.

## 2022-02-26 ENCOUNTER — Telehealth: Payer: Self-pay

## 2022-02-26 NOTE — Telephone Encounter (Signed)
  Follow up Call-     02/23/2022   10:49 AM  Call back number  Post procedure Call Back phone  # [phone]  Permission to leave phone message Yes     Patient questions:  Do you have a fever, pain , or abdominal swelling? No. Pain Score  0 *  Have you tolerated food without any problems? Yes.    Have you been able to return to your normal activities? Yes.    Do you have any questions about your discharge instructions: Diet   No. Medications  No. Follow up visit  No.  Do you have questions or concerns about your Care? No.  Actions: * If pain score is 4 or above: No action needed, pain <4.

## 2022-03-20 ENCOUNTER — Encounter: Payer: Self-pay | Admitting: Gastroenterology

## 2022-06-29 ENCOUNTER — Inpatient Hospital Stay: Admission: RE | Admit: 2022-06-29 | Payer: PPO | Source: Ambulatory Visit

## 2022-07-02 ENCOUNTER — Other Ambulatory Visit: Payer: Self-pay | Admitting: Physician Assistant

## 2022-07-02 DIAGNOSIS — E2839 Other primary ovarian failure: Secondary | ICD-10-CM

## 2022-07-02 DIAGNOSIS — Z780 Asymptomatic menopausal state: Secondary | ICD-10-CM

## 2022-08-15 ENCOUNTER — Other Ambulatory Visit: Payer: Self-pay | Admitting: Physician Assistant

## 2022-08-15 DIAGNOSIS — Z1231 Encounter for screening mammogram for malignant neoplasm of breast: Secondary | ICD-10-CM

## 2022-10-29 ENCOUNTER — Other Ambulatory Visit: Payer: Self-pay | Admitting: Physician Assistant

## 2022-12-04 ENCOUNTER — Inpatient Hospital Stay: Admission: RE | Admit: 2022-12-04 | Payer: PPO | Source: Ambulatory Visit

## 2023-06-03 NOTE — Progress Notes (Signed)
 Cardiology Office Note:  .   Date:  06/06/2023  ID:  Michelle [name] Server, DOB [birth year], MRN [id number] PCP: Rosalea Rosina SAILOR, [place]  [facility] Providers Cardiologist:  Redell Shallow, MD { History of Present Illness: Michelle [name] is a 63 y.o. female with history of CAD s/p CABG who presents for the evaluation of CAD at the request of Rosalea Rosina SAILOR, [place].   History of Present Illness   Michelle [name], a 63 year old female with a history of coronary artery disease (CAD) status post coronary artery bypass graft (CABG) in 2018, presents for established care. She reports occasional burning in her chest, occurring infrequently, about once or twice a month, and lasting only a few seconds. She denies any significant shortness of breath. She is a current everyday smoker, smoking about half a pack per day since she was 14. She has a family history of heart disease, with her grandmother having died from it. She also reports a history of a light stroke in 2017. She has venous insufficiency, with many veins in her legs, but denies any pain or cramping in her legs when walking. She has been advised to wear compression stockings for this. She is not currently taking aspirin , having been advised to stop by another healthcare provider. She is on Crestor 20mg  daily for cholesterol management, but does not have a recent lipid profile. Current smoker 1/2 ppd. Fam hx heart disease. No CP or SOB reported.          Problem List CAD  CABG x 3 2018 (LIMA-LAD, SVG-D1, SVG-PDA-PLV) 2. HLD -T chol 151, HDL 51, LDL 67, TG 252 -A1c 5.9 3. Tobacco abuse  4. Venous insufficiency    ROS: All other ROS reviewed and negative. Pertinent positives noted in the HPI.     Studies Reviewed: SABRA   EKG Interpretation Date/Time:  Thursday June 06 2023 14:17:08 EST Ventricular Rate:  89 PR Interval:  132 QRS Duration:  86 QT Interval:  382 QTC Calculation: 464 R Axis:   20  Text Interpretation: Normal  sinus rhythm Possible Left atrial enlargement T wave abnormality, consider inferior ischemia T wave abnormality, consider anterolateral ischemia Confirmed by Barbaraann Kotyk [phone]) on 06/06/2023 2:29:44 PM   Physical Exam:   VS:  BP 114/60 (BP Location: Left Arm, Patient Position: Sitting, Cuff Size: Normal)   Pulse 89   Ht 5' 2 (1.575 m)   Wt 150 lb (68 kg)   BMI 27.44 kg/m    Wt Readings from Last 3 Encounters:  06/06/23 150 lb (68 kg)  02/23/22 148 lb (67.1 kg)  02/09/22 148 lb (67.1 kg)    GEN: Well nourished, well developed in no acute distress NECK: No JVD; No carotid bruits CARDIAC: RRR, no murmurs, rubs, gallops RESPIRATORY:  Clear to auscultation without rales, wheezing or rhonchi  ABDOMEN: Soft, non-tender, non-distended EXTREMITIES:  No edema; No deformity  ASSESSMENT AND PLAN: .   Assessment and Plan    Coronary Artery Disease (CAD) s/p CABG without angina  Status post CABG in 2018. Infrequent, brief episodes of chest burning. No exertional symptoms. -Order echocardiogram for updated assessment of cardiac function. -Resume daily baby aspirin . -Return for fasting lipid panel. -continue crestor 20 mg daily.   Tobacco Use Half pack per day smoker since age 79. Discussed the importance of smoking cessation. -Provided 3 minutes of smoking cessation counseling.  Venous Insufficiency Noted on physical exam. No symptoms of peripheral arterial disease. -Advised to continue with compression  stockings and leg elevation.  General Health Maintenance -Annual cardiology follow-up visits due to history of CAD and CABG. -Report any increase in frequency or severity of chest symptoms.              Follow-up: Return in about 1 year (around 06/05/2024).  Signed, Darryle DASEN. Barbaraann, MD, [facility]  [facility]  [address], [address] [place], [place] [address] [phone]  5:42 PM

## 2023-06-06 ENCOUNTER — Encounter: Payer: Self-pay | Admitting: Cardiovascular Disease

## 2023-06-06 ENCOUNTER — Ambulatory Visit: Payer: PPO | Attending: Cardiovascular Disease | Admitting: Cardiovascular Disease

## 2023-06-06 VITALS — BP 114/60 | HR 89 | Ht 62.0 in | Wt 150.0 lb

## 2023-06-06 DIAGNOSIS — Z720 Tobacco use: Secondary | ICD-10-CM | POA: Diagnosis not present

## 2023-06-06 DIAGNOSIS — F17210 Nicotine dependence, cigarettes, uncomplicated: Secondary | ICD-10-CM

## 2023-06-06 DIAGNOSIS — E782 Mixed hyperlipidemia: Secondary | ICD-10-CM | POA: Diagnosis not present

## 2023-06-06 DIAGNOSIS — I25810 Atherosclerosis of coronary artery bypass graft(s) without angina pectoris: Secondary | ICD-10-CM | POA: Diagnosis not present

## 2023-06-06 NOTE — Patient Instructions (Signed)
 Medication Instructions:  - Start Aspirin  81mg  daily    *If you need a refill on your cardiac medications before your next appointment, please call your pharmacy*   Lab Work: Your physician recommends that you return for lab work in 1 WEEK for : FASTING LIPID PANEL     If you have labs (blood work) drawn today and your tests are completely normal, you will receive your results only by: MyChart Message (if you have MyChart) OR A paper copy in the mail If you have any lab test that is abnormal or we need to change your treatment, we will call you to review the results.   Testing/Procedures: Echo will be scheduled at [address].  Your physician has requested that you have an echocardiogram. Echocardiography is a painless test that uses sound waves to create images of your heart. It provides your doctor with information about the size and shape of your heart and how well your heart's chambers and valves are working. This procedure takes approximately one hour. There are no restrictions for this procedure. Please do NOT wear cologne, perfume, aftershave, or lotions (deodorant is allowed). Please arrive 15 minutes prior to your appointment time.    Follow-Up: At [facility], you and your health needs are our priority.  As part of our continuing mission to provide you with exceptional heart care, we have created designated Provider Care Teams.  These Care Teams include your primary Cardiologist (physician) and Advanced Practice Providers (APPs -  Physician Assistants and Nurse Practitioners) who all work together to provide you with the care you need, when you need it.  We recommend signing up for the patient portal called MyChart.  Sign up information is provided on this After Visit Summary.  MyChart is used to connect with patients for Virtual Visits (Telemedicine).  Patients are able to view lab/test results, encounter notes, upcoming appointments, etc.  Non-urgent  messages can be sent to your provider as well.   To learn more about what you can do with MyChart, go to [email or web address].    Your next appointment:   1 year(s)  The format for your next appointment:   In Person  Provider:   Darryle Decent, MD    Other Instructions

## 2023-06-27 ENCOUNTER — Ambulatory Visit (HOSPITAL_COMMUNITY): Payer: PPO | Attending: Cardiovascular Disease

## 2023-06-27 DIAGNOSIS — I25810 Atherosclerosis of coronary artery bypass graft(s) without angina pectoris: Secondary | ICD-10-CM | POA: Insufficient documentation

## 2023-06-27 LAB — ECHOCARDIOGRAM COMPLETE
Area-P 1/2: 5.46 cm2
S' Lateral: 2.6 cm

## 2023-10-29 ENCOUNTER — Other Ambulatory Visit: Payer: Self-pay | Admitting: Physician Assistant

## 2023-11-13 ENCOUNTER — Other Ambulatory Visit: Payer: Self-pay | Admitting: Physician Assistant

## 2023-11-27 ENCOUNTER — Telehealth: Payer: Self-pay

## 2023-11-27 NOTE — Progress Notes (Signed)
   11/27/2023  Patient ID: Michelle [name] Server, female   DOB: 13-Apr-1961, 63 y.o.   MRN: [id number]  Contacted patient regarding referral for medication management from Catalina Bare, MD .   Unable to leave voicemail, not set up, will attempt to contact patient again tomorrow.   Heather Factor, PharmD Clinical Pharmacist  [phone]

## 2023-11-28 NOTE — Progress Notes (Signed)
   11/28/2023  Patient ID: Michelle [name], female   DOB: 1960/10/25, 63 y.o.   MRN: [id number]  Contacted patient regarding referral for medication management from Osei-Bonsu, Zachary, MD .   Unable to leave voicemail, not set up, will attempt to contact patient again next week.  Heather Factor, PharmD Clinical Pharmacist  [phone]

## 2023-12-02 NOTE — Progress Notes (Addendum)
   12/02/2023  Patient ID: Michelle [name], female   DOB: 10-02-1960, 63 y.o.   MRN: [id number]  Contacted patient regarding referral for medication management from Osei-Bonsu, Zachary, MD .   Unable to leave voicemail, as it has not been set up by the patient.   As I have been unable to reach the patient, I am unable to perform a comprehensive medication review. However, based on the patient's active medication list, although I am unable to confirm which medications the patient is actively taking, drug-drug interactions exist with the following medications: Cyclobenzaprine, Duloxetine, Quetiapine , and Gabapentin  - she should be monitored for serotonin syndrome, neuroleptic malignant syndrome, QT prolongation, and the respiratory depression; or therapy should be adjusted to reduce these risks.   A third unsuccessful telephone outreach was attempted today to contact the patient who was referred to the pharmacy for assistance with medication management. The Population Health team is pleased to engage with this patient at any time in the future upon receipt of referral and should he/she be interested in assistance from the [facility] team.   Heather Factor, PharmD Clinical Pharmacist  [phone]

## 2024-02-12 ENCOUNTER — Other Ambulatory Visit: Payer: Self-pay | Admitting: Physician Assistant

## 2024-02-12 DIAGNOSIS — Z1231 Encounter for screening mammogram for malignant neoplasm of breast: Secondary | ICD-10-CM

## 2024-03-25 ENCOUNTER — Ambulatory Visit
Admission: RE | Admit: 2024-03-25 | Discharge: 2024-03-25 | Disposition: A | Source: Ambulatory Visit | Attending: Physician Assistant | Admitting: Physician Assistant

## 2024-03-25 DIAGNOSIS — Z1231 Encounter for screening mammogram for malignant neoplasm of breast: Secondary | ICD-10-CM

## 2024-04-01 ENCOUNTER — Other Ambulatory Visit: Payer: Self-pay | Admitting: Physician Assistant

## 2024-04-01 DIAGNOSIS — R928 Other abnormal and inconclusive findings on diagnostic imaging of breast: Secondary | ICD-10-CM
